# Patient Record
Sex: Female | Born: 1960
Health system: Southern US, Community
[De-identification: ages and names within clinical notes are randomized; demographics above are authoritative.]

## PROBLEM LIST (undated history)

## (undated) DIAGNOSIS — Z860100 Personal history of colon polyps, unspecified: Secondary | ICD-10-CM

## (undated) DIAGNOSIS — F329 Major depressive disorder, single episode, unspecified: Secondary | ICD-10-CM

## (undated) DIAGNOSIS — J929 Pleural plaque without asbestos: Secondary | ICD-10-CM

## (undated) DIAGNOSIS — R6882 Decreased libido: Secondary | ICD-10-CM

## (undated) DIAGNOSIS — D649 Anemia, unspecified: Secondary | ICD-10-CM

## (undated) DIAGNOSIS — K5900 Constipation, unspecified: Secondary | ICD-10-CM

## (undated) DIAGNOSIS — M1990 Unspecified osteoarthritis, unspecified site: Secondary | ICD-10-CM

## (undated) DIAGNOSIS — F32A Depression, unspecified: Secondary | ICD-10-CM

## (undated) DIAGNOSIS — F419 Anxiety disorder, unspecified: Secondary | ICD-10-CM

## (undated) DIAGNOSIS — Q615 Medullary cystic kidney: Secondary | ICD-10-CM

## (undated) DIAGNOSIS — M549 Dorsalgia, unspecified: Secondary | ICD-10-CM

## (undated) DIAGNOSIS — R609 Edema, unspecified: Secondary | ICD-10-CM

## (undated) DIAGNOSIS — M797 Fibromyalgia: Secondary | ICD-10-CM

## (undated) DIAGNOSIS — J941 Fibrothorax: Secondary | ICD-10-CM

## (undated) DIAGNOSIS — K589 Irritable bowel syndrome without diarrhea: Secondary | ICD-10-CM

## (undated) DIAGNOSIS — Z87442 Personal history of urinary calculi: Secondary | ICD-10-CM

## (undated) DIAGNOSIS — I10 Essential (primary) hypertension: Secondary | ICD-10-CM

## (undated) DIAGNOSIS — C439 Malignant melanoma of skin, unspecified: Secondary | ICD-10-CM

## (undated) DIAGNOSIS — M0520 Rheumatoid vasculitis with rheumatoid arthritis of unspecified site: Secondary | ICD-10-CM

## (undated) DIAGNOSIS — Z8719 Personal history of other diseases of the digestive system: Secondary | ICD-10-CM

## (undated) DIAGNOSIS — G43909 Migraine, unspecified, not intractable, without status migrainosus: Secondary | ICD-10-CM

## (undated) DIAGNOSIS — N1330 Unspecified hydronephrosis: Secondary | ICD-10-CM

## (undated) DIAGNOSIS — J4520 Mild intermittent asthma, uncomplicated: Secondary | ICD-10-CM

## (undated) DIAGNOSIS — R0602 Shortness of breath: Secondary | ICD-10-CM

## (undated) HISTORY — PX: PARTIAL NEPHRECTOMY: SHX414

## (undated) HISTORY — DX: Dorsalgia, unspecified: M54.9

## (undated) HISTORY — DX: Shortness of breath: R06.02

## (undated) HISTORY — DX: Edema, unspecified: R60.9

## (undated) HISTORY — DX: Unspecified osteoarthritis, unspecified site: M19.90

## (undated) HISTORY — DX: Malignant melanoma of skin, unspecified: C43.9

## (undated) HISTORY — DX: Anemia, unspecified: D64.9

## (undated) HISTORY — DX: Decreased libido: R68.82

## (undated) HISTORY — DX: Depression, unspecified: F32.A

## (undated) HISTORY — DX: Essential (primary) hypertension: I10

## (undated) HISTORY — PX: OTHER SURGICAL HISTORY: SHX169

## (undated) HISTORY — PX: COLONOSCOPY W/ POLYPECTOMY: SHX1380

## (undated) HISTORY — DX: Anxiety disorder, unspecified: F41.9

## (undated) HISTORY — PX: LAPAROSCOPIC CHOLECYSTECTOMY: SUR755

## (undated) HISTORY — DX: Rheumatoid vasculitis with rheumatoid arthritis of unspecified site: M05.20

## (undated) HISTORY — DX: Major depressive disorder, single episode, unspecified: F32.9

## (undated) HISTORY — DX: Fibromyalgia: M79.7

## (undated) HISTORY — DX: Constipation, unspecified: K59.00

---

## 1991-11-28 HISTORY — PX: TUBAL LIGATION: SHX77

## 1994-11-27 HISTORY — PX: ABDOMINAL HYSTERECTOMY: SHX81

## 1994-11-27 HISTORY — PX: TOTAL ABDOMINAL HYSTERECTOMY: SHX209

## 2000-03-15 ENCOUNTER — Encounter: Admission: RE | Admit: 2000-03-15 | Discharge: 2000-03-15 | Payer: Self-pay | Admitting: Family Medicine

## 2000-03-15 ENCOUNTER — Encounter: Payer: Self-pay | Admitting: Family Medicine

## 2000-06-27 HISTORY — PX: ERCP: SHX60

## 2000-07-12 ENCOUNTER — Emergency Department (HOSPITAL_COMMUNITY): Admission: EM | Admit: 2000-07-12 | Discharge: 2000-07-13 | Payer: Self-pay | Admitting: Emergency Medicine

## 2000-07-13 ENCOUNTER — Inpatient Hospital Stay (HOSPITAL_COMMUNITY): Admission: EM | Admit: 2000-07-13 | Discharge: 2000-07-18 | Payer: Self-pay | Admitting: Emergency Medicine

## 2000-07-13 ENCOUNTER — Encounter: Payer: Self-pay | Admitting: Emergency Medicine

## 2000-07-16 ENCOUNTER — Encounter: Payer: Self-pay | Admitting: Gastroenterology

## 2000-07-17 ENCOUNTER — Encounter: Payer: Self-pay | Admitting: General Surgery

## 2000-10-15 ENCOUNTER — Encounter (INDEPENDENT_AMBULATORY_CARE_PROVIDER_SITE_OTHER): Payer: Self-pay

## 2000-10-15 ENCOUNTER — Observation Stay (HOSPITAL_COMMUNITY): Admission: RE | Admit: 2000-10-15 | Discharge: 2000-10-16 | Payer: Self-pay | Admitting: General Surgery

## 2001-07-09 ENCOUNTER — Other Ambulatory Visit: Admission: RE | Admit: 2001-07-09 | Discharge: 2001-07-09 | Payer: Self-pay | Admitting: Obstetrics & Gynecology

## 2002-12-18 ENCOUNTER — Encounter: Admission: RE | Admit: 2002-12-18 | Discharge: 2002-12-18 | Payer: Self-pay | Admitting: Urology

## 2002-12-18 ENCOUNTER — Encounter: Payer: Self-pay | Admitting: Urology

## 2002-12-31 ENCOUNTER — Encounter: Admission: RE | Admit: 2002-12-31 | Discharge: 2002-12-31 | Payer: Self-pay | Admitting: Urology

## 2002-12-31 ENCOUNTER — Encounter: Payer: Self-pay | Admitting: Urology

## 2003-03-09 ENCOUNTER — Ambulatory Visit (HOSPITAL_COMMUNITY): Admission: RE | Admit: 2003-03-09 | Discharge: 2003-03-09 | Payer: Self-pay | Admitting: Gastroenterology

## 2003-03-09 ENCOUNTER — Encounter (INDEPENDENT_AMBULATORY_CARE_PROVIDER_SITE_OTHER): Payer: Self-pay | Admitting: *Deleted

## 2006-03-26 ENCOUNTER — Encounter: Admission: RE | Admit: 2006-03-26 | Discharge: 2006-03-26 | Payer: Self-pay | Admitting: Urology

## 2006-04-17 ENCOUNTER — Encounter: Admission: RE | Admit: 2006-04-17 | Discharge: 2006-04-17 | Payer: Self-pay | Admitting: Family Medicine

## 2006-04-17 ENCOUNTER — Encounter: Payer: Self-pay | Admitting: Pulmonary Disease

## 2006-05-18 ENCOUNTER — Encounter (INDEPENDENT_AMBULATORY_CARE_PROVIDER_SITE_OTHER): Payer: Self-pay | Admitting: Specialist

## 2006-05-18 ENCOUNTER — Inpatient Hospital Stay (HOSPITAL_COMMUNITY): Admission: RE | Admit: 2006-05-18 | Discharge: 2006-05-22 | Payer: Self-pay | Admitting: Urology

## 2007-03-01 ENCOUNTER — Ambulatory Visit: Payer: Self-pay | Admitting: Family Medicine

## 2007-08-14 ENCOUNTER — Encounter: Admission: RE | Admit: 2007-08-14 | Discharge: 2007-08-14 | Payer: Self-pay | Admitting: Urology

## 2007-10-14 ENCOUNTER — Ambulatory Visit: Payer: Self-pay | Admitting: Family Medicine

## 2008-03-02 ENCOUNTER — Ambulatory Visit: Payer: Self-pay | Admitting: Family Medicine

## 2008-11-11 ENCOUNTER — Encounter: Admission: RE | Admit: 2008-11-11 | Discharge: 2008-11-11 | Payer: Self-pay | Admitting: Family Medicine

## 2008-11-11 ENCOUNTER — Ambulatory Visit: Payer: Self-pay | Admitting: Family Medicine

## 2008-11-11 ENCOUNTER — Encounter: Payer: Self-pay | Admitting: Pulmonary Disease

## 2008-12-11 ENCOUNTER — Ambulatory Visit: Payer: Self-pay | Admitting: Family Medicine

## 2009-01-13 ENCOUNTER — Ambulatory Visit: Payer: Self-pay | Admitting: Family Medicine

## 2009-01-29 ENCOUNTER — Encounter: Admission: RE | Admit: 2009-01-29 | Discharge: 2009-01-29 | Payer: Self-pay | Admitting: Family Medicine

## 2009-01-29 ENCOUNTER — Encounter: Payer: Self-pay | Admitting: Pulmonary Disease

## 2009-07-16 ENCOUNTER — Ambulatory Visit: Payer: Self-pay | Admitting: Family Medicine

## 2010-04-14 ENCOUNTER — Ambulatory Visit: Payer: Self-pay | Admitting: Family Medicine

## 2010-04-15 ENCOUNTER — Encounter: Payer: Self-pay | Admitting: Pulmonary Disease

## 2010-04-15 ENCOUNTER — Encounter: Admission: RE | Admit: 2010-04-15 | Discharge: 2010-04-15 | Payer: Self-pay | Admitting: Family Medicine

## 2010-04-28 ENCOUNTER — Encounter: Payer: Self-pay | Admitting: Pulmonary Disease

## 2010-04-28 ENCOUNTER — Ambulatory Visit: Payer: Self-pay | Admitting: Physician Assistant

## 2010-04-29 ENCOUNTER — Ambulatory Visit: Payer: Self-pay | Admitting: Pulmonary Disease

## 2010-04-29 DIAGNOSIS — J45909 Unspecified asthma, uncomplicated: Secondary | ICD-10-CM | POA: Insufficient documentation

## 2010-04-29 DIAGNOSIS — J189 Pneumonia, unspecified organism: Secondary | ICD-10-CM | POA: Insufficient documentation

## 2010-04-29 DIAGNOSIS — J8410 Pulmonary fibrosis, unspecified: Secondary | ICD-10-CM | POA: Insufficient documentation

## 2010-04-29 LAB — CONVERTED CEMR LAB
Albumin: 4.3 g/dL (ref 3.5–5.2)
BUN: 17 mg/dL (ref 6–23)
Chloride: 105 meq/L (ref 96–112)
Eosinophils Relative: 1.3 % (ref 0.0–5.0)
HCT: 39.4 % (ref 36.0–46.0)
Hemoglobin: 13.6 g/dL (ref 12.0–15.0)
Lymphocytes Relative: 33 % (ref 12.0–46.0)
MCHC: 34.5 g/dL (ref 30.0–36.0)
MCV: 89.1 fL (ref 78.0–100.0)
Neutro Abs: 3.5 10*3/uL (ref 1.4–7.7)
RDW: 13.4 % (ref 11.5–14.6)
TSH: 0.66 microintl units/mL (ref 0.35–5.50)
Total Bilirubin: 0.7 mg/dL (ref 0.3–1.2)
WBC: 5.8 10*3/uL (ref 4.5–10.5)

## 2010-05-02 ENCOUNTER — Ambulatory Visit: Payer: Self-pay | Admitting: Cardiology

## 2010-05-06 ENCOUNTER — Ambulatory Visit: Payer: Self-pay | Admitting: Pulmonary Disease

## 2010-05-10 ENCOUNTER — Encounter: Payer: Self-pay | Admitting: Pulmonary Disease

## 2010-05-10 LAB — CONVERTED CEMR LAB
Anti Nuclear Antibody(ANA): POSITIVE — AB
HCV Ab: NEGATIVE
Hep B C IgM: NEGATIVE
Hep B S Ab: NEGATIVE
Hepatitis B Surface Ag: NEGATIVE

## 2010-05-24 ENCOUNTER — Ambulatory Visit: Payer: Self-pay | Admitting: Pulmonary Disease

## 2010-07-06 ENCOUNTER — Telehealth (INDEPENDENT_AMBULATORY_CARE_PROVIDER_SITE_OTHER): Payer: Self-pay | Admitting: *Deleted

## 2010-08-24 ENCOUNTER — Ambulatory Visit: Payer: Self-pay | Admitting: Family Medicine

## 2010-09-06 ENCOUNTER — Ambulatory Visit: Payer: Self-pay | Admitting: Family Medicine

## 2010-12-27 NOTE — Assessment & Plan Note (Signed)
Summary: f/u per Dr. Carney Harder   Copy to:  Dr. Sharlot Gowda Primary Provider/Referring Provider:  Dr. Sharlot Gowda  CC:  F/u per Dr Craige Cotta.  History of Present Illness: 50 yo female with mild pulmonary fibrosis.   Her breathing has been doing okay. She denies cough, wheeze, sputum, fever, hemoptysis, chest pain, sweats, leg swelling, rash, joint pain, or abdominal pain.  She is not using proventil that much, and has only been using symbicort sporadically.  Labs from April 29, 2010: ESR 11, ANA 1:320 with homogenous pattern, RF < 20, ACE 30. Pulmonary function test from May 06, 2010 was normal.  CT of Chest  Procedure date:  05/02/2010  Findings:      Technique:  Multidetector CT imaging of the chest was performed following the standard protocol during bolus administration of intravenous contrast.   Contrast: 80 ml Omnipaque-300   Comparison: Chest x-ray 04/15/2010 and CT chest 04/17/2006   Findings: Prevascular soft tissue density is stable.  No pathologically enlarged mediastinal, hilar or axillary lymph nodes. Heart size normal.  No pericardial effusion.   Minimal biapical pleural parenchymal scarring.  Mild subpleural fibrosis.  No pleural fluid.  Airway is unremarkable. No air trapping on inspiratory and expiratory imaging.   Incidental imaging of the upper abdomen shows no acute findings. No worrisome lytic or sclerotic lesions.   IMPRESSION: Mild subpleural fibrosis.  No acute findings.   Current Medications (verified): 1)  Symbicort 160-4.5 Mcg/act Aero (Budesonide-Formoterol Fumarate) .... Two Puffs Two Times A Day 2)  Proventil Hfa 108 (90 Base) Mcg/act Aers (Albuterol Sulfate) .... Two Puffs Up To Four Times Per Day As Needed  Allergies (verified): 1)  Pcn  Past History:  Past Medical History: Medullary sponge kidney Nephrolithiasis Fatty liver IBS Pancreatitis 2001 Hemorrhoids Pulmonary fibrosis with hx of chronic macrodantin use      - PFT  05/06/10 FEV1 2.98(112%), FEV1% 77, TLC 5.69(110%), DLCO 87%, no BD      - ANA 1:320 from 04/29/10 Mild, intermittent asthma  Past Surgical History: Reviewed history from 04/29/2010 and no changes required. C-section x two Tubal ligation 1993 Hysterectomy 1996 for abnormal bleeding Right partial nephrectomy 1998 for benign lesion ERCP 2001 Laparoscopic cholecystectomy 2001 Colon polypectomy 2004 Left partial nephrectomy 2007 for angiomyolipoma  Review of Systems       The patient complains of headaches.  The patient denies shortness of breath with activity, shortness of breath at rest, productive cough, non-productive cough, coughing up blood, chest pain, irregular heartbeats, acid heartburn, indigestion, loss of appetite, weight change, abdominal pain, difficulty swallowing, sore throat, nasal congestion/difficulty breathing through nose, itching, hand/feet swelling, joint stiffness or pain, rash, change in color of mucus, and fever.    Vital Signs:  Patient profile:   50 year old female Height:      66 inches Weight:      173 pounds O2 Sat:      99 % on Room air Temp:     98.1 degrees F Pulse rate:   72 / minute BP sitting:   110 / 82  (right arm) Cuff size:   regular  Vitals Entered By: Denna Haggard, CMA (May 24, 2010 3:27 PM)  O2 Sat at Rest %:  99% O2 Flow:  Room air  Reason for Visit F/U per Dr Craige Cotta, pt states she is doing a lot better, pt states her breating is a lot  better but still has a small temp problem adjusting from hot to cold. CC: F/u  per Dr Craige Cotta   Physical Exam  General:  normal appearance and healthy appearing.   Nose:  no deformity, discharge, inflammation, or lesions Mouth:  no deformity or lesions Neck:  no masses, thyromegaly, or abnormal cervical nodes Lungs:  faint basilar crackles, no wheezing Heart:  regular rate and rhythm, S1, S2 without murmurs, rubs, gallops, or clicks Extremities:  no clubbing, cyanosis, edema, or deformity  noted Cervical Nodes:  no significant adenopathy   Impression & Recommendations:  Problem # 1:  INTERSTITIAL LUNG DISEASE (ICD-515) This is likely related to prior macrodantin use.  She is asymptomatic, her pulmonary function tests are normal, and her CT chest has only mild changes.  She does have an elevated ANA, but nothing else to suggest a connective tissue disease.  I don't think she needs any further diagnostic studies (ie bronchoscopy or lung biopsy) at this time.  I don't think she requires any specific therapy at this time.  I have recommended close clinical monitoring, and she will need to have repeat ANA in several months.  If her ANA remains elevated or if she develops symptoms of connective tissue disease, then she may need rheumatology evaluation.  Problem # 2:  REACTIVE AIRWAY DISEASE (ICD-493.90) She likely has mild, intermittent asthma exacerbated by recent respiratory infection.  She has not been using her symbicort much.  I have advised her that it is okay to stop symbicort, and use proventil as needed for now.  Complete Medication List: 1)  Proventil Hfa 108 (90 Base) Mcg/act Aers (Albuterol sulfate) .... Two puffs up to four times per day as needed  Other Orders: Est. Patient Level IV (01027)  Patient Instructions: 1)  Proventil two puffs up to four times per day as needed for cough, wheeze, congestion, or shortness of breath 2)  Stop symbicort 3)  Follow up in 4 to 6 months

## 2010-12-27 NOTE — Progress Notes (Signed)
Summary: need corrected diag codes  Phone Note Other Incoming Call back at [phone]   Caller: [facility]- [place] Foil Summary of Call: Ref# [id number] - Need diagnosis codes for some labs - Dennie Bible says it was from visit on 06/03 Initial call taken by: Eugene Gavia,  July 06, 2010 10:40 AM  Follow-up for Phone Call        Diagnosis is 493.90 for OV and labs from 04/29/2010 with Dr. Craige Cotta. [facility] notified of this. Follow-up by: Michel Bickers CMA,  July 06, 2010 2:36 PM    0

## 2010-12-27 NOTE — Letter (Signed)
Summary: Ronnald Nian MD  Ronnald Nian MD   Imported By: Lester Paoli 05/09/2010 08:16:06  _____________________________________________________________________  External Attachment:    Type:   Image     Comment:   External Document

## 2010-12-27 NOTE — Miscellaneous (Signed)
Summary: Pulmonary function test   Pulmonary Function Test Date: 05/06/2010 Height (in.): 65 Gender: Female  Pre-Spirometry FVC    Value: 3.88 L/min   Pred: 3.47 L/min     % Pred: 112 % FEV1    Value: 2.98 L     Pred: 2.65 L     % Pred: 112 % FEV1/FVC  Value: 77 %     Pred: 76 %     % Pred: . % FEF 25-75  Value: 2.70 L/min   Pred: 3.01 L/min     % Pred: 90 %  Post-Spirometry FVC    Value: 3.69 L/min   Pred: 3.47 L/min     % Pred: 106 % FEV1    Value: 2.95 L     Pred: 2.65 L     % Pred: 111 % FEV1/FVC  Value: 80 %     Pred: 76 %     % Pred: . % FEF 25-75  Value: 2.74 L/min   Pred: 3.01 L/min     % Pred: 91 %  Lung Volumes TLC    Value: 5.69 L   % Pred: 110 % RV    Value: 1.81 L   % Pred: 100 % DLCO    Value: 20.9 %   % Pred: 87 % DLCO/VA  Value: 4.08 %   % Pred: 101 %  Comments: Normal spirometry.  No bronchodilator response.  Normal lung volumes.  Normal diffusion capacity. Clinical Lists Changes  Observations: Added new observation of PFT COMMENTS: Normal spirometry.  No bronchodilator response.  Normal lung volumes.  Normal diffusion capacity. (05/06/2010 18:22) Added new observation of DLCO/VA%EXP: 101 % (05/06/2010 18:22) Added new observation of DLCO/VA: 4.08 % (05/06/2010 18:22) Added new observation of DLCO % EXPEC: 87 % (05/06/2010 18:22) Added new observation of DLCO: 20.9 % (05/06/2010 18:22) Added new observation of RV % EXPECT: 100 % (05/06/2010 18:22) Added new observation of RV: 1.81 L (05/06/2010 18:22) Added new observation of TLC % EXPECT: 110 % (05/06/2010 18:22) Added new observation of TLC: 5.69 L (05/06/2010 18:22) Added new observation of FEF2575%EXPS: 91 % (05/06/2010 18:22) Added new observation of PSTFEF25/75P: 3.01  (05/06/2010 18:22) Added new observation of PSTFEF25/75%: 2.74 L/min (05/06/2010 18:22) Added new observation of PSTFEV1/FCV%: . % (05/06/2010 18:22) Added new observation of FEV1FVCPRDPS: 76 % (05/06/2010 18:22) Added new observation  of PSTFEV1/FVC: 80 % (05/06/2010 18:22) Added new observation of POSTFEV1%PRD: 111 % (05/06/2010 18:22) Added new observation of FEV1PRDPST: 2.65 L (05/06/2010 18:22) Added new observation of POST FEV1: 2.95 L/min (05/06/2010 18:22) Added new observation of POST FVC%EXP: 106 % (05/06/2010 18:22) Added new observation of FVCPRDPST: 3.47 L/min (05/06/2010 18:22) Added new observation of POST FVC: 3.69 L (05/06/2010 18:22) Added new observation of FEF % EXPEC: 90 % (05/06/2010 18:22) Added new observation of FEF25-75%PRE: 3.01 L/min (05/06/2010 18:22) Added new observation of FEF 25-75%: 2.70 L/min (05/06/2010 18:22) Added new observation of FEV1/FVC%EXP: . % (05/06/2010 18:22) Added new observation of FEV1/FVC PRE: 76 % (05/06/2010 18:22) Added new observation of FEV1/FVC: 77 % (05/06/2010 18:22) Added new observation of FEV1 % EXP: 112 % (05/06/2010 18:22) Added new observation of FEV1 PREDICT: 2.65 L (05/06/2010 18:22) Added new observation of FEV1: 2.98 L (05/06/2010 18:22) Added new observation of FVC % EXPECT: 112 % (05/06/2010 18:22) Added new observation of FVC PREDICT: 3.47 L (05/06/2010 18:22) Added new observation of FVC: 3.88 L (05/06/2010 18:22) Added new observation of PFT HEIGHT: 65  (05/06/2010 18:22) Added new observation of  PFT DATE: 05/06/2010  (05/06/2010 18:22)

## 2010-12-27 NOTE — Assessment & Plan Note (Signed)
Summary: chinic interstitial lung changes on cxr/apc   Copy to:  Dr. Sharlot Gowda Primary Provider/Referring Provider:  Dr. Sharlot Gowda  CC:  Pulmonary Consult.  Marland Kitchen  History of Present Illness: 50 yo female with dyspnea and interstitial lung disease.  She has an extensive prior history of kidney problems and bladder infections.  She was started on macrodantin in 2007, and remained on this until December 2010.  She had CT chest from 2007 which showed patchy parenchymal opacities.  Her subsequent chest xrays have show chronic interstitial changes.  She has not had a breathing test or lung biopsy before.  She says, however, that her breathing usually does not cause her any problems except when she gets a cold.  She developed a respiratory infection about 2 weeks, and ago.  She was given a course of biaxin XL.  She was noted to have wheezing with response to nebulizer therapy.  She was therefore also started on symbicort and albuterol.  She feels the inhalers have helped her breathing.  When her symptoms started 2 weeks ago she was having fever upto 102F, cough with brown to green sputum, wheezing, and chest tightness.  She did not have hemoptysis, or sweats.  Her symptoms have since improved.  Her weight has been steady.  She has not had skin rash or hives.  She does not have any problem taking NSAIDs.  There is no history of arthritis or lupus.  There is no prior history of asthma, or allergies.  She has taken prednisone before when she gets bronchitis.  She has not smoked, but her husband does.  There is no travel history, sick exposures, or animal exposures.  She works as a Pensions consultant at [employer].  She gets bronchitis about once per year.  There is no history of TB.    Preventive Screening-Counseling & Management  Alcohol-Tobacco     Smoking Status: never  Current Medications (verified): 1)  None  Allergies (verified): 1)  Pcn  Past History:  Past Medical History: Medullary  sponge kidney Nephrolithiasis Fatty liver IBS Pancreatitis 2001 Hemorrhoids  Past Surgical History: C-section x two Tubal ligation 1993 Hysterectomy 1996 for abnormal bleeding Right partial nephrectomy 1998 for benign lesion ERCP 2001 Laparoscopic cholecystectomy 2001 Colon polypectomy 2004 Left partial nephrectomy 2007 for angiomyolipoma  CXR  Procedure date:  04/15/2010  Findings:       CHEST - 2 VIEW    Comparison: Chest x-ray of 11/11/2008    Findings: - The lungs are clear.  Slightly prominent interstitial   markings primarily at the bases appears stable and most consistent   with chronic interstitial change.  Mediastinal contours are stable.   The heart is within normal limits in size.  No bony abnormality is   seen.    IMPRESSION:   No active lung disease.  Stable chronic interstitial change.  CT of Chest  Procedure date:  04/17/2006  Findings:       CT ANGIOGRAPHY OF CHEST:   Technique:  Multidetector CT imaging of the chest was performed   during bolus injection of intravenous contrast.  Multiplanar CT   angiographic image reconstructions were generated to evaluate the   vascular anatomy.   Contrast: 120 cc Omnipaque 300   Comparison:  Abdominal CT, 12/31/02, from [facility].   Findings:  The pulmonary arteries are well-opacified with contrast.   There is no evidence of acute pulmonary embolism.  The thoracic aorta   appears normal.  The left vertebral artery is noted  to arise directly   from the aortic arch.  There is no pleural or pericardial effusion.   No enlarged mediastinal or hilar lymph nodes are seen.   On the lung windows, patchy peripheral opacities are present in both   lower lobes as well as anteriorly in the upper lobes and the right   middle lobe.  There is no confluent air space opacity or   endobronchial lesion.  The lung bases were clear on the prior   examination.   Images through the upper abdomen demonstrate stable scarring  in the   upper pole of the right kidney.  There is no adrenal mass.  The   gallbladder is surgically absent.   IMPRESSION:   1.  No evidence of acute pulmonary embolism.   2.  Patchy parenchymal opacities appear new from a prior abdominal CT   and are suspicious for pneumonitis.  There is no consolidation, focal   mass or adenopathy.  Chest radiographic follow-up is recommended.     Family History: Reviewed history and no changes required. father-lung ca  Social History: Reviewed history and no changes required. Patient never smoked.  married 2 children Works as Pensions consultant at [employer].Smoking Status:  never  Review of Systems       The patient complains of shortness of breath with activity, shortness of breath at rest, productive cough, non-productive cough, nasal congestion/difficulty breathing through nose, sneezing, ear ache, change in color of mucus, and fever.  The patient denies coughing up blood, chest pain, irregular heartbeats, acid heartburn, indigestion, loss of appetite, weight change, abdominal pain, difficulty swallowing, sore throat, tooth/dental problems, headaches, itching, anxiety, depression, hand/feet swelling, joint stiffness or pain, and rash.    Vital Signs:  Patient profile:   50 year old female Height:      65 inches Weight:      177 pounds BMI:     29.56 O2 Sat:      97 % on Room air Temp:     98.2 degrees F oral Pulse rate:   56 / minute BP sitting:   128 / 78  (left arm) Cuff size:   regular  Vitals Entered By: Gweneth Dimitri RN (April 29, 2010 9:11 AM)  O2 Flow:  Room air CC: Pulmonary Consult.   Comments Medications reviewed with patient Daytime contact number verified with patient. Gweneth Dimitri RN  April 29, 2010 9:12 AM    Physical Exam  General:  normal appearance and healthy appearing.   Eyes:  PERRLA and EOMI.   Nose:  no deformity, discharge, inflammation, or lesions Mouth:  no deformity or lesions Neck:  no masses,  thyromegaly, or abnormal cervical nodes Chest Wall:  no deformities noted Lungs:  faint basilar crackles, no wheezing Heart:  regular rate and rhythm, S1, S2 without murmurs, rubs, gallops, or clicks Abdomen:  bowel sounds positive; abdomen soft and non-tender without masses, or organomegaly Msk:  no deformity or scoliosis noted with normal posture Pulses:  pulses normal Extremities:  no clubbing, cyanosis, edema, or deformity noted Neurologic:  CN II-XII grossly intact with normal reflexes, coordination, muscle strength and tone Skin:  intact without lesions or rashes Cervical Nodes:  no significant adenopathy Axillary Nodes:  no significant adenopathy Psych:  alert and cooperative; normal mood and affect; normal attention span and concentration   Impression & Recommendations:  Problem # 1:  INTERSTITIAL LUNG DISEASE (ICD-515) She has chronic interstitial changes on chest xray and CT chest dating back to 2007.  This correlates when she was started on macrodantin for bladder infections.  She as since stopped macrodantin.  My suspicion is that the macrodantin is the cause of her radiographic findings.  To further assess this I will arrange for pulmonary function testing, high resolution CT chest, and lab testing.  Depending on the results further interventions will be determined.  Problem # 2:  REACTIVE AIRWAY DISEASE (ICD-493.90) She has an asthmatic bronchitis in the setting of an acute respiratory infection.  Will continue symbicort and as needed proventil for now.  Will arrange for pulmonary function testing.  Depending on her response will decide if she requires long-term asthma therapy.  Medications Added to Medication List This Visit: 1)  Symbicort 160-4.5 Mcg/act Aero (Budesonide-formoterol fumarate) .... Two puffs two times a day 2)  Proventil Hfa 108 (90 Base) Mcg/act Aers (Albuterol sulfate) .... Two puffs up to four times per day as needed  Complete Medication List: 1)   Symbicort 160-4.5 Mcg/act Aero (Budesonide-formoterol fumarate) .... Two puffs two times a day 2)  Proventil Hfa 108 (90 Base) Mcg/act Aers (Albuterol sulfate) .... Two puffs up to four times per day as needed  Other Orders: Consultation Level V (99245) TLB-BMP (Basic Metabolic Panel-BMET) (80048-METABOL) TLB-CBC Platelet - w/Differential (85025-CBCD) TLB-Hepatic/Liver Function Pnl (80076-HEPATIC) TLB-TSH (Thyroid Stimulating Hormone) (84443-TSH) TLB-Rheumatoid Factor (RA) (16109-UE) TLB-Sedimentation Rate (ESR) (85652-ESR) T-Angiotensin i-Converting Enzyme (45409-81191) T-Antinuclear Antib (ANA) [phone]) T-Hepatitis B Core Antibody, IGM (08657-84696) T-Hepatitis B Core Antibody (29528-41324) T-Hepatitis B Surface Antigen (40102-72536) T-Hepatitis B Surface Antibody (64403-47425) T-Hepatitis C Antibody (95638-75643) Radiology Referral (Radiology) Full Pulmonary Function Test (PFT)  Patient Instructions: 1)  Lab test today 2)  Will schedule breathing test (PFT) 3)  Will schedule CT chest

## 2010-12-27 NOTE — Miscellaneous (Signed)
Summary: Orders Update pft charges  Clinical Lists Changes  Orders: Added new Service order of Carbon Monoxide diffusing w/capacity (94720) - Signed Added new Service order of Lung Volumes (94240) - Signed Added new Service order of Spirometry (Pre & Post) (94060) - Signed 

## 2011-01-16 ENCOUNTER — Other Ambulatory Visit: Payer: Self-pay | Admitting: Gastroenterology

## 2011-01-16 ENCOUNTER — Ambulatory Visit (HOSPITAL_COMMUNITY)
Admission: RE | Admit: 2011-01-16 | Discharge: 2011-01-16 | Disposition: A | Discharge status: Home / Self Care | Payer: 59 | Source: Ambulatory Visit | Attending: Gastroenterology | Admitting: Gastroenterology

## 2011-01-16 DIAGNOSIS — K5900 Constipation, unspecified: Secondary | ICD-10-CM | POA: Insufficient documentation

## 2011-01-16 DIAGNOSIS — K648 Other hemorrhoids: Secondary | ICD-10-CM | POA: Insufficient documentation

## 2011-01-16 DIAGNOSIS — K644 Residual hemorrhoidal skin tags: Secondary | ICD-10-CM | POA: Insufficient documentation

## 2011-01-16 DIAGNOSIS — K5730 Diverticulosis of large intestine without perforation or abscess without bleeding: Secondary | ICD-10-CM | POA: Insufficient documentation

## 2011-01-16 DIAGNOSIS — D126 Benign neoplasm of colon, unspecified: Secondary | ICD-10-CM | POA: Insufficient documentation

## 2011-01-16 LAB — HM COLONOSCOPY

## 2011-01-30 ENCOUNTER — Ambulatory Visit (INDEPENDENT_AMBULATORY_CARE_PROVIDER_SITE_OTHER): Payer: 59 | Admitting: Family Medicine

## 2011-01-30 DIAGNOSIS — J0100 Acute maxillary sinusitis, unspecified: Secondary | ICD-10-CM

## 2011-01-30 DIAGNOSIS — J209 Acute bronchitis, unspecified: Secondary | ICD-10-CM

## 2011-02-03 NOTE — Op Note (Signed)
  NAMELANNY, [name] NO.:  [id number]  MEDICAL RECORD NO.:  [id number]           PATIENT TYPE:  O  LOCATION:  [facility]                         FACILITY:  [facility]  PHYSICIAN:  Petra Kuba, M.D.    DATE OF BIRTH:  1961/10/13  DATE OF PROCEDURE:  01/16/2011 DATE OF DISCHARGE:  01/16/2011                              OPERATIVE REPORT   PROCEDURE:  Colonoscopy with polypectomy.  INDICATIONS:  The patient overdue with colonic screening, but with increasing constipation.  Consent was signed after risks, benefits, methods, options thoroughly discussed multiple times in the past.  MEDICINES USED:  Fentanyl 125 mcg, Versed 12 mg.  PROCEDURE:  Rectal inspection was pertinent for external hemorrhoids. Small digital exam was negative.  The video colonoscope was inserted and fairly easily advanced around the colon to the cecum.  This did require abdominal pressure, but no position changes.  On insertion, rare few left-sided diverticula were seen, but no other abnormalities.  Cecum was identified by the appendiceal orifice and ileocecal valve.  In fact the scope was inserted in the ileocecal valve.  The scope was slowly withdrawn.  The prep was adequate.  There was some liquid stool that required washing and suctioning on slow withdrawal through the colon. The cecum, ascending, transverse, and descending were all normal.  As the scope was withdrawn around the sigmoid, rare diverticula small were seen.  In the distal sigmoid, a tiny small polyp was seen, snared, electrocautery applied.  The polyp was suctioned through the scope and collected in the trap.  Scope was withdrawn back to the rectum. Anorectal pull-through and retroflexion revealed some tiny hemorrhoids, but no additional findings were seen.  The scope was re-advanced to the short way up the left side of the colon.  Air was suctioned, scope removed.  The patient tolerated the procedure well.  There was no obvious  immediate complication.  ENDOSCOPIC DIAGNOSES: 1. Internal-external hemorrhoids. 2. Few left-sided early diverticula. 3. Tiny to small distal semi-sessile sigmoid polyp status post snare. 4. Otherwise within normal limits to the cecum.  PLAN:  Await pathology.  Probably repeat colon screening in 5 years. Trial of MiraLax, but if it does not help for her constipation, happy to see back p.r.n. or a trial of Amitiza per primary care.          ______________________________ Petra Kuba, M.D.     MEM/MEDQ  D:  01/16/2011  T:  01/17/2011  Job:  [id number]  cc:   Sharlot Gowda, M.D.  Electronically Signed by Vida Rigger M.D. on 02/02/2011 08:00:08 AM

## 2011-04-14 NOTE — Discharge Summary (Signed)
NAMESURAIYA, Webb NO.:  [id number]   MEDICAL RECORD NO.:  [id number]          PATIENT TYPE:  INP   LOCATION:  1403                         FACILITY:  [facility]   PHYSICIAN:  Jamison Neighbor, M.D.  DATE OF BIRTH:  09-10-1961   DATE OF ADMISSION:  05/18/2006  DATE OF DISCHARGE:  05/22/2006                                 DISCHARGE SUMMARY   SERVICE:  Urology.   ADMISSION DIAGNOSIS:  Left renal mass.   DISCHARGE DIAGNOSIS:  Left angiomyolipoma.   PROCEDURE:  Left partial nephrectomy on May 18, 2006.   HISTORY OF PRESENT ILLNESS:  [name] is a 50 year old female admitted with  a left renal mass. This has been identified after a workup for hematuria.  She had a CT scan showing a solid appearing enhancing lesion in the anterior  aspect of the lower pole of the kidney. MRI study was performed with imaging  characteristics also concerning for a renal neoplasm. Of note, the patient  has a history of a right partial nephrectomy which was reported for benign  disease. Because of her findings on imaging studies, she is admitted for  left partial nephrectomy.   HOSPITAL COURSE:  The patient was brought to the hospital on the day of the  procedure and taken to the operating room for the procedure described above.  She tolerated the surgery well and was transferred to the floor in stable  condition. Her postoperative course was relatively uncomplicated. She was  ambulated without difficulty. Her diet was slowly advanced. She did have  some nausea with p.o. pain medicines requiring tailoring of this but was  gradually able to control with oral hydrocodone and Tylenol. She had a JP  drain in place after surgery which had minimal to no output and this was  removed prior to discharge. She otherwise was doing well and ready for  discharge on postoperative day #4.   VITAL SIGNS:  At the time of discharge, she is afebrile, vital signs within  normal limits.  GENERAL:  She  is in no acute distress.  HEART:  Regular.  LUNGS:  Clear.  ABDOMEN:  Soft, nontender. Her surgical incision in her left flank was  clean, dry and intact with staples in place.  EXTREMITIES:  There was cyanosis, clubbing or edema.   DISPOSITION AND DISCHARGE INSTRUCTIONS:  [name] was discharged to home in  stable condition. She presumed diet. She was instructed not to do any  strenuous activity but otherwise be up as tolerated. She is to call with any  questions or concerns regarding her care specifically any fevers greater  than 101.5 or increasing abdominal pain, nausea, vomiting or redness or  drainage from her incision. She understood and agreed to these instructions.   DISCHARGE MEDICATIONS:  The patient was instructed to resume her previous  medications. She was also give a prescription for hydrocodone and  acetaminophen as well as prescription for Phenergan. She was to take these  as needed.   FOLLOWUP:  Followup will be with Dr. Logan Bores in approximately 1 week for  staple removal.  FINAL PATHOLOGY RESULTS:  Renal angiomyolipoma.     ______________________________  Terie Purser, MD      Jamison Neighbor, M.D.  Electronically Signed    JH/MEDQ  D:  05/22/2006  T:  05/22/2006  Job:  [id number]

## 2011-04-14 NOTE — Op Note (Signed)
NAME:  [name], [name]                          ACCOUNT NO.:  [id number]   MEDICAL RECORD NO.:  [id number]                   PATIENT TYPE:  AMB   LOCATION:  ENDO                                 FACILITY:  [facility]   PHYSICIAN:  Petra Kuba, M.D.                 DATE OF BIRTH:  1961/07/01   DATE OF PROCEDURE:  03/09/2003  DATE OF DISCHARGE:                                 OPERATIVE REPORT   PROCEDURE PERFORMED:  Colonoscopy.   INDICATIONS FOR PROCEDURE:  Abdominal pain with bowel movements.  Consent  was signed after risks, benefits, methods and options were thoroughly  discussed in the office.   MEDICINES USED:  Demerol 80, Versed 8   DESCRIPTION OF PROCEDURE:  Rectal inspection was pertinent for external  hemorrhoids small.  Digital exam was negative.  Pediatric video adjustable  colonoscope was inserted and fairly easily advanced around the colon to the  cecum.  This did require rolling her on her back and some abdominal  pressures.  No obvious abnormality was seen on insertion.  The cecum was  identified by the appendiceal orifice and the ileocecal valve.  In fact the  scope was inserted a short ways into the terminal ileum which was normal.  Photo documentation was obtained.  The scope was slowly withdrawn.  The prep  was adequate.  On slow withdraw through the colon, tiny cecal polyp was seen  and was carefully hot biopsied x 2 and cold biopsied x 1 with the setting of  15/15.  The scope was slowly withdrawn.  Another tiny polyp in the mid  transverse was seen and hot biopsied on a setting of 20/20 and put in the  same container.  The scope was further withdrawn.  No additional findings  were seen as we slowly withdrew back to the rectum.  Once back in the rectum  a small polyp was seen, snared, electrocautery applied and the polyp was  suctioned through the scope and collected in the trap.  Possibly there was  some residual polypoid tissue which was cold biopsied x 1.  On  retroflexion  there was some small internal hemorrhoids.  The scope was straightened and  readvanced a short ways up the left side of the colon.  Air was suctioned.  The scope removed.  The patient tolerated the procedure well.  There was no  obvious immediate complication.   ENDOSCOPIC DIAGNOSES:  1. Internal and external hemorrhoids.  2. Small rectal polyp snared and then cold biopsied.  3. Transverse and cecal tiny polyps hot biopsied.  4. Otherwise within normal limits to the terminal ileum.    PLAN:  Await pathology to determine future colonic screening.  Consider  small bowel follow through to complete her workup versus proceeding with a  laparoscopy for adhesions which is the probable cause of disease.  Happy to  see back p.r.n.  Will touch base  and check on her when you review her  pathology.                                               Petra Kuba, M.D.    MEM/MEDQ  D:  03/09/2003  T:  03/09/2003  Job:  [id number]   cc:   Jamison Neighbor, M.D.  [address]  [place]  [place] [address]  Fax: [phone]

## 2011-04-14 NOTE — H&P (Signed)
[name], [name] NO.:  [id number]   MEDICAL RECORD NO.:  [id number]          PATIENT TYPE:  INP   LOCATION:  0001                         FACILITY:  [facility]   PHYSICIAN:  Jamison Neighbor, M.D.  DATE OF BIRTH:  05-11-1961   DATE OF ADMISSION:  05/18/2006  DATE OF DISCHARGE:                                HISTORY & PHYSICAL   CHIEF COMPLAINT:  Renal mass.   HISTORY OF PRESENT ILLNESS:  [name] is a 50 year old female who is  admitted today for left renal mass. She has a history of a right partial  nephrectomy for a kidney tumor. The final pathology of that lesion is  unknown at the time of this dictation. She has had a history of a medullary  sponge kidney and kidney stones. She was evaluated for hematuria by Dr.  Logan Bores. There was a lesion noted in the lower pole of the left kidney on CT  and MRI which had imaging characteristics which were worrisome for a renal  cell tumor. She has also a history of chronic cystitis but has been doing  well from that standpoint. She otherwise has no significant complaints.   ALLERGIES:  PENICILLIN.   MEDICATIONS:  Macrodantin.   PAST MEDICAL HISTORY:  1.  Kidney stones.  2.  Irritable bowel syndrome.  3.  Kidney tumor.   PAST SURGICAL HISTORY:  Cholecystectomy, partial nephrectomy on the right in  1999.   FAMILY HISTORY:  Significant for cancer in her father also high blood  pressure and kidney stones.   SOCIAL HISTORY:  The patient denies any alcohol, tobacco or drug use.   REVIEW OF SYSTEMS:  Multisystem review is performed and is as above  otherwise noted.   PHYSICAL EXAMINATION:  VITAL SIGNS:  Afebrile, vital signs stable.  GENERAL:  No acute distress.  HEART:  Regular rate and rhythm.  LUNGS:  Clear.  ABDOMEN:  Soft and nontender.  BACK/FLANKS:  Nontender.  PSYCHE:  Alert and oriented.  EXTREMITIES:  There is no cyanosis, clubbing or edema.   ASSESSMENT/PLAN:  [name] is a 50 year old female who has  an approximately  1-1/2 cm enhancing mass in the lower pole of the left kidney. She will be  admitted to the hospital today for left partial nephrectomy and admitted for  routine postoperative care.     ______________________________  Terie Purser, MD      Jamison Neighbor, M.D.  Electronically Signed    JH/MEDQ  D:  05/18/2006  T:  05/18/2006  Job:  [id number]

## 2011-04-14 NOTE — Op Note (Signed)
NAMEMarland Kitchen  [name], [name] NO.:  [id number]   MEDICAL RECORD NO.:  [id number]          PATIENT TYPE:  INP   LOCATION:  0001                         FACILITY:  [facility]   PHYSICIAN:  Jamison Neighbor, M.D.  DATE OF BIRTH:  May 09, 1961   DATE OF PROCEDURE:  05/18/2006  DATE OF DISCHARGE:                                 OPERATIVE REPORT   SERVICE:  Urology.   PREOPERATIVE DIAGNOSIS:  Left renal mass.   POSTOPERATIVE DIAGNOSIS:  Left renal mass, likely angiomyolipoma.   PROCEDURE PERFORMED:  1.  Left partial nephrectomy.  2  Intraoperative ultrasound.   ATTENDING SURGEON:  Jamison Neighbor, M.D.   ASSISTANT:  Terie Purser, M.D.   ANESTHESIA:  General endotracheal.   COMPLICATIONS:  None.   DRAINS:  Foley catheter to straight drain, JP drain to bulb suction.   SPECIMENS:  Left renal mass for frozen and permanent analysis.   ESTIMATED BLOOD LOSS:  200 mL.   COMPLICATIONS:  None.   DISPOSITION:  Stable to post anesthesia care unit.   INDICATIONS FOR PROCEDURE:  [name] is a 50 year old female who has been  followed by Dr. Logan Bores in the past.  She has a reported history of the  medullary sponge kidney.  She was recently undergone imaging of her abdomen  and pelvis and reveals presence of an approximate 1-1/2 to 2 cm mass in the  lower pole of the left kidney.  This mass has demonstrated  enhancement  characteristics worrisome for a renal cell carcinoma.  This was evaluated  both with CT and MR.  She also has a previous history of right partial  nephrectomy.  After full discussion of benefits and risks of procedure, the  patient has consented for left partial nephrectomy to address her renal  mass.   DESCRIPTION OF PROCEDURE:  The patient was brought to the operating room,  properly identified.  A time-out was performed confirming the correct  patient, procedure and side.  She was administered general anesthesia and  given preoperative antibiotics and then placed  in the left flank position  and prepped and draped in a sterile fashion.  We began the procedure making  an incision off the eleventh rib.  Dissection was carried down through the  subcutaneous tissues and the eleventh rib was exposed.  The overlying tissue  was dissected free of the rib and rib cutter was used to remove the rib at  the lateral portion of the incision.  We then proceeded through the muscle  layers individually. The retroperitoneum was exposed, entered with care  taken not to enter the peritoneum.  The retroperitoneal space was developed  and the kidney was isolated and mobilized.  We entered Gerota's fascia and  exposed the entire kidney.  The patient's mass was not significantly  exophytic on imaging.  There was no obvious mass of the ventral aspect of  the left kidney that was an identifiable tumor.  We subsequently brought a  intraoperative ultrasound into the room and performed ultrasound of the  kidney.  In the area of question, there was evidence  of an approximate 1-1/2  cm echogenic appearing tumor which was just under the capsule and abutting  some of the renal collecting system.  We felt that this represented the  tumor in question.  Further ultrasound imaging of the kidney did not  demonstrate any other identifiable lesion.  At this point, we isolated the  renal vein and secured this with a Vesseloop.  The renal artery was  dissected free just posterior to the vein and isolated.  We then used the  ultrasound to mark the region of the mass.  The capsule was scored with the  Bovie.  We then placed a bulldog clamp over the renal artery.  The tumor was  then excised using the blunt end of a knife blade.  This was done without  difficulty.  The specimen was then sent to pathology for frozen analysis.  Preliminary results revealed findings consistent with an angiomyolipoma.  At  this point, we used the argon beam to coagulate the tumor bed.  There was a  concern that  there was a small entry into the collecting system.  This area  was oversewn using figure-of-eight 4-0 Vicryl suture.  This appeared to  adequately close the area in question.  We then applied FloSeal and Tisseel  into the tumor bed.  The artery was unclamped.  Total clamp time was 19  minutes.  Hemostasis appeared to be adequate at this point.  There was no  significant bleeding.  We elected to placed a sheet of Surgicel covered with  perinephric fat over the excision site.  Hemostasis was excellent at this  point.  We then began to close.  A JP drain was placed in the  retroperitoneal space.  When we began to close the wound, there did appear  to be a small pleurotomy.  This was oversewn with a 3-0 Vicryl suture.  We  then proceeded to close the wound in three layers using one 1-0 PDS.  The  skin was closed with staples after irrigation.  All sponge and needle counts  were correct at the end of the case.  There were no complications.  Please  note that Dr. Logan Bores was present and participated in all aspects of this  procedure as he is the primary surgeon.  The patient was then awoken from  anesthesia and transported to the recovery room in stable condition.     ______________________________  Terie Purser, MD      Jamison Neighbor, M.D.  Electronically Signed    JH/MEDQ  D:  05/18/2006  T:  05/18/2006  Job:  [id number]

## 2011-04-14 NOTE — Discharge Summary (Signed)
[facility]  Patient:    ARRYN, [name]                       MRN: [id number] Adm. Date:  [id number] Disc. Date: [id number] Attending:  Caleen Essex                           Discharge Summary  REASON FOR ADMISSION:  [name] is a 50 year old white female with no identifiable risk factors for pancreatitis who presented several days ago with epigastric and right upper quadrant pain and was found to have a lipase in the high 700 range.  She was admitted for bowel rest and IV hydration.  A gastroenterology consult was obtained, and they performed an ERCP on her which was normal.  She had no evidence of gallstones as the etiology of her pancreatitis.   She had a normal triglyceride level and did not use any alcohol products.  Her pain worsened somewhat in the days following the ERCP, but then continued to improve again to the point where she could tolerate a liquid diet without difficulty.  At this point, she wanted to go home, and she was stable for discharge.  CONDITION ON DISCHARGE:  Stable.  DISCHARGE MEDICATIONS: 1. Resume her home medications. 2. Vicodin 1-2 p.o. q.4-6h. p.r.Webb.  DIET:  Low-fat.  FOLLOW-UP:  She will call for follow-up with either myself or Dr. Kinnie Scales in gastroenterology in the next couple of weeks. DD:  07/18/00 TD:  07/18/00 Job: [id number]

## 2011-04-14 NOTE — Procedures (Signed)
[facility]  Patient:    [name], [name]                       MRN: [id number] Proc. Date: 07/16/00 Adm. Date:  [id number] Attending:  Caleen Essex CC:         Chevis Pretty, M.D.   Procedure Report  PROCEDURE PERFORMED:  Endoscopic retrograde cholangiopancreatography.  ENDOSCOPIST:  Griffith Citron, M.D.  INDICATIONS:  This is a 50 year old white female undergoing an ERCP to evaluate etiology of recent bout of acute pancreatitis.  Hospitalized four days ago with marked elevation of amylase, lipase and right upper quadrant pain.  Symptoms subsided over the past several days.  Abdominal ultrasound revealed pancreatic duct dilation, no evidence of hepatobiliary inflammation or obstruction.  DESCRIPTION OF PROCEDURE:  After reviewing the nature of the procedure with the patient including potential risks of unsuccessful procedure, pancreatitis, cholangitis, perforation and hemorrhage, informed consent was signed.  The patient was premedicated receiving IV sedation totalling fentanyl 75 mcg and Versed 10 mg IV.  Using an Olympus video duodenoscope, the proximal esophagus was intubated under direct vision.  The scope was advanced into the stomach.  A retroflex view of the cardia, lesser curve and antrum all normal.  The pylorus symmetric and easily traversed.  The scope positioned in the second portion of the duodenum.  The ampulla appeared normal.  Free cannulation of the common bile duct revealed a normal distal common bile duct, common hepatic duct, right and left branches and intrahepatic radicles.  No evidence for intraluminal filling defect to suggest gallstones.  The cystic duct was patent filling a normal appearing gallbladder.  The scope was reoriented and a pancreatogram was easily obtained.  This revealed normal smooth tapering without ductal dilation.  No evidence of mass or other finding.  Both ducts rapidly drained as followed endoscopically.   The patient was returned to recovery in stable condition having been maintained on low flow oxygen and monitor throughout.  ASSESSMENT: 1. Normal endoscopic retrograde cholangiopancreatography. 2. Acute idiopathic pancreatitis.  RECOMMENDATIONS: 1. Post endoscopic retrograde cholangiopancreatography order is    written. 2. Advance diet gradually to low fat. 3. Low fat diet for one month. 4. Pancreatic enzyme supplements to be used for the next month with    meals. DD:  07/16/00 TD:  07/16/00 Job: [id number]

## 2011-04-14 NOTE — Op Note (Signed)
[facility]  Patient:    [name], [name]                       MRN: [id number] Proc. Date: 10/15/00 Adm. Date:  [id number] Attending:  Chevis Pretty S                           Operative Report  PREOPERATIVE DIAGNOSES:  Questionable gallstone pancreatitis.  POSTOPERATIVE DIAGNOSES:  Questionable gallstone pancreatitis.  PROCEDURE:  Laparoscopic cholecystectomy.  SURGEON:  Dr. Carolynne Edouard.  ASSISTANT:  Dr. Orson Slick.  ANESTHESIA:  General endotracheal.  DESCRIPTION OF PROCEDURE:  After informed consent was obtained, the patient was taken to the operating room and placed in the supine position on the operating room table. After adequate induction of general anesthesia, the patients abdomen was prepped with Betadine and draped in the usual sterile manner. A small transverse infraumbilical incision was made to the skin with a 15 blade knife. This incision was carried down through the subcutaneous tissue with blunt dissection using a Kelly clamp and [employer] retractors until the fascia of the midline was encountered. This fascia was then incised with a 15 blade knife and each side was grasped with Kocher clamps and elevated. The hole in the fascia was then probed with a Kelly clamp bluntly until entry was gained into the abdominal cavity. A finger was inserted through this hole and palpation of the anterior abdominal wall did not reveal any adhesions. A #0 Vicryl pursestring suture was then placed in the fascia around this hole and a Hasson cannula was placed through this opening into the abdominal cavity. The abdomen was then insufflated with carbon dioxide. A small transverse upper midline incision was then made with the 15 blade knife and a 10 mm port was placed through this incision into the abdominal cavity under direct vision. Using the laparoscope, 2 small 5 mm incisions were made out laterally on the right side of the abdomen with the 15 blade knife and again  two 5 mm ports were placed through the incisions into the abdominal cavity again under direct vision using the laparoscope. A blunt grasper was then placed through the lateral most port and used to grasp the dome of the gallbladder and elevate it anteriorly and superiorly. Another blunt grasper was placed through the other lateral port and used to retract laterally on the body and neck of the gallbladder. There were several adhesions of omentum to the surface of the gallbladder and these were taken down both bluntly with a [place] dissector placed through the upper midline port as well as with some Bovie dissection also using the [employer]. Once this was complete, blunt dissection was used the open the peritoneal reflection at the junction of the neck of the gallbladder and the cystic duct. This area of the gallbladder neck and cystic duct was dissected bluntly in a circumferential manner until the junction was easily identified. Care was taken to make sure the common duct was medial to this dissection. Three clips were placed proximally on the cystic duct and one distally on the gallbladder side and the cystic duct was then divided between the two. Posterior to this, the cystic artery was identified and also directed bluntly in a circumferential manner, 2 clips were placed proximally and 1 distally on the artery and the artery was divided between the 2. Once this was complete, dissection using the hook cautery was used to remove  the gallbladder from the bed of the liver. Once this was done, the bed of the liver was then inspected and hemostasis was excellent. The laparoscope was then moved to the upper midline port and a gallbladder grasper was placed through the Hasson cannula. The neck of the gallbladder was grasped with the gallbladder grasper and the gallbladder was removed through the umbilical hole through the umbilical port. The Hasson cannula was then replaced into this  hole and the abdomen was again inspected. The liver bed was irrigated with saline and effluent was clear. All the ports were then removed under direct vision and the fascial defect at the umbilical port was then closed with the previously placed pursestring Vicryl stitch. The skin was closed with interrupted 4-0 monocryl subcuticular stitches. Benzoin and Steri-Strips and sterile dressings were applied. The patient tolerated the procedure well. At the end of the case, all needle, sponge, and instrument counts were correct. The patient was awakened and taken to the recovery room in stable condition. DD:  10/15/00 TD:  10/16/00 Job: [id number]

## 2011-04-14 NOTE — H&P (Signed)
[name]  Patient:    [name], [name]                       MRN: [id number] Adm. Date:  [id number] Attending:  Caleen Essex                         History and Physical  HISTORY OF PRESENT ILLNESS:  [name] is a 50 year old white female who presents to the emergency room with a couple day history of epigastric and right upper quadrant pain, nausea and vomiting.  She states the pain began suddenly a couple of days ago.  She has been unable to keep much in the way of liquids or food down.  She has had several episodes of vomiting over the last day or two.  She has been complaining of pain kind of in the middle of her abdomen in the right upper quadrant.  The pain comes and goes and has doubled her over at times in the last couple of days, but is currently bearable.  She denies any fevers, chills, chest pain, shortness of breath, diarrhea or dysuria.  REVIEW OF SYSTEMS:  Her other review of systems is unremarkable.  PAST MEDICAL HISTORY:  Significant for a tumor on her right kidney.  PAST SURGICAL HISTORY:  Excision of the tumor on her right kidney.  MEDICATIONS:  None.  ALLERGIES:  Allergic to PENICILLIN.  SOCIAL HISTORY:  She denies any use of alcohol or tobacco.  FAMILY HISTORY:  Significant for lung cancer in her father.  PHYSICAL EXAMINATION:  VITAL SIGNS:  Blood pressure 113/76, pulse 67, temperature 97.7.  GENERAL:  She is a well-developed, well-nourished white female in no acute distress.  SKIN:  Warm and dry.  HEENT:  Extraocular muscles are intact.  Pupils are equal, round and reactive to light.  NECK:  No bruits.  LUNGS:  Clear to auscultation bilaterally.  HEART:  Regular rate and rhythm with a 2/6 systolic murmur.  ABDOMEN:  Soft with tenderness to palpation in the epigastric and right upper quadrant areas without any peritoneal signs.  She has normal bowel sounds. She has a well-healed right flank scar.  EXTREMITIES:   Show no clubbing, cyanosis, or edema with good peripheral pulses.  NEUROLOGIC:  Alert and oriented x4.  HEMATOLOGIC:  She has no palpable lymphadenopathy.  LABORATORY DATA:  She has significant lipase of 739.  Her white count is 7000 with 53% segmented neutrophils and 39% lymphocytes.  Her hemoglobin 12.2, hematocrit 34.4, platelet count 177,000.  Sodium 140, potassium 3.3, chloride 109, CO2 25, BUN 7, creatinine 0.7, glucose 88, total bilirubin 0.8, alkaline phosphatase 49, SGOT 17.  She had ultrasound done yesterday that showed no evidence of gallstones or cholecystitis and was otherwise normal.  ASSESSMENT/PLAN:  This is a 50 year old white female with pancreatitis.  We will obtain repeat amylase and lipase to confirm that the pancreatitis is the etiology of her pain.  We will also check her triglyceride level as a possible source of her pancreatitis.  We will admit her to the [facility] for bowel rest and IV hydration.  I will also ask the gastroenterologists to see her and give their recommendations. DD:  07/13/00 TD:  07/14/00 Job: [id number]

## 2011-04-14 NOTE — Consult Note (Signed)
[facility]  Patient:    [name], [name]                       MRN: [id number] Proc. Date: 07/13/00 Adm. Date:  [id number] Attending:  Caleen Essex CC:         Chevis Pretty, M.D.  Elvina Sidle, M.D.   Consultation Report  GASTROENTEROLOGY CONSULTATION  INDICATION:  I was asked to see Ms. Esquivias by Dr. Chevis Pretty to assist in the evaluation of her acute pancreatitis.  HISTORY OF PRESENT ILLNESS:  Patient has been in excellent health until this week.  Very nonspecific vague complaints of decreased energy earlier this week, also with daily bowel movement which is unusual, more typically having a bowel movement every two to three days.  Yesterday, at 11 a.m., patient had acute onset of excruciating right upper quadrant pain radiating into the right flank.  It was relatively short-lived, only 45 minutes, and then resolved completely.  The patient ate lunch and within an hour, had recurrence of pain even worse in severity and without resolution.  Another severe wave at 5 p.m. ultimately brought her to the [facility] Emergency Room.  She was evaluated and found to have elevated amylase and lipase.  An abdominal ultrasound was reportedly normal.  Over the past 36 hours, the patient has had five severe episodes associated with nausea and vomiting.  She has felt feverish without rigor but with chilling and diaphoresis.  She was seen by her family physician, Dr. Elvina Sidle of [facility], earlier today and was instructed to come to the [facility] Emergency Room for further evaluation. She was seen this evening by Dr. Chevis Pretty, who is admitting her for further evaluation and symptomatic treatment and support.  The patient has no risk factors for pancreatitis.  She denies alcohol.  On no medications.  There is no family history of pancreatitis.  She does not have known hyperlipidemia.  She has not sustained any abdominal trauma.  Risks  factors for gallbladder disease include age, gender and fecundity.  PAST MEDICAL HISTORY:  Significant for a right renal tumor resection performed by Dr. Jamison Neighbor, 1998, benign pathology.  Status post hysterectomy.  ALLERGIES:  PENICILLIN.  CURRENT MEDICAL REGIMEN:  None.  REVIEW OF SYSTEMS:  Overall health, energy, appetite excellent.  Recent swelling of hands and feet this past week.  Increased frequency of bowel movements this past week.  Otherwise, noncontributory.  SOCIAL HISTORY:  Patient is married.  Two children, ages 61 and 3, both healthy.  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  No tobacco or alcohol use.  FAMILY HISTORY:  Significant for absence of pancreatitis.  PHYSICAL EXAMINATION  GENERAL:  Anxious, healthy, alert, oriented, no acute distress, resting comfortably.  VITAL SIGNS:  Stable; temperature 97.7, respirations 12, pulse 64, BP 110/76.  HEENT:  Anicteric sclerae.  Pink conjunctivae.  No pallor.  No oropharyngeal lesion.  NECK:  Supple.  No adenopathy, thyromegaly or bruit.  CHEST:  Clear to auscultation.  BACK:  Right CVA tenderness.  ABDOMEN:  Soft.  Marked tenderness in the epigastrium and right upper quadrant.  Patient unwilling to take a deep breath to test for a Murphys sign.  No palpable gallbladder or hepar edge.  No splenomegaly obvious.  Bowel sounds are active without borborygmi, bruit or splash.  No firmness.  No mass.  RECTAL:  Not performed.  EXTREMITIES:  No clubbing, cyanosis,  or edema.  SKIN:  Multiple small skin lesions over the torso; many of these have been removed.  They are too small to determine whether there is central umbilication, but these are suggestive of neurofibromatosis.  LABORATORY DATA:  CBC with differential normal including WBC 7000, hemoglobin 12.8, hematocrit 37.1.  Lipase 739.  Urinalysis normal.  BMET, HFP -- normal.  ASSESSMENT 1. Acute pancreatitis -- patients clinical presentation  is much more    consistent with gallstones disease than with pancreatic disease.  The    episodic nature of her pain, right upper quadrant localization, description    of intense excruciating pain lasting for several hours, then relenting,    nausea and vomiting associated with worsening pain.  Physical    examination with marked right-sided tenderness, active bowel sounds,    nondistended also suggest biliary over pancreatic etiology of her clinical    presentation.  She appears too comfortable and healthy for a patient with    moderate or severe pancreatitis.  Even more mild pancreatitis produces a    more constant pain syndrome, which the patient obviously is not    experiencing at the time of this consultation.  Furthermore, she does not    have risk factors for pancreatitis and does have several for gallstone    disease; the caveat is her normal liver function tests and abdominal    ultrasound. 2. Skin lesions -- possible neurofibromatosis.  We should obtain the pathology    from her renal tumor resection to rule out possible neurofibromas causing    biliary and/or pancreatic duct obstruction.  RECOMMENDATION 1. Agree with Dr. Caralyn Guile plan for IV fluids, analgesia, antiemetics, bowel    rest and observation. 2. Consider ERCP if clinical symptom subsides.  If it should worsen, I would    order an urgent MRCP and if common bile duct stone identified, then proceed    with ERCP for the purpose of stone extraction.  I discussed this with the    patient and her husband, who is present throughout the consultation. 3. I will follow the patient along over the weekend and discuss possible role    or ERCP, pending her clinical course. DD:  07/13/00 TD:  07/15/00 Job: [id number]

## 2011-09-12 ENCOUNTER — Encounter: Payer: Self-pay | Admitting: Family Medicine

## 2011-09-12 ENCOUNTER — Ambulatory Visit (INDEPENDENT_AMBULATORY_CARE_PROVIDER_SITE_OTHER): Payer: 59 | Admitting: Family Medicine

## 2011-09-12 VITALS — BP 150/100 | HR 73 | Wt 174.0 lb

## 2011-09-12 DIAGNOSIS — M79609 Pain in unspecified limb: Secondary | ICD-10-CM

## 2011-09-12 DIAGNOSIS — R030 Elevated blood-pressure reading, without diagnosis of hypertension: Secondary | ICD-10-CM

## 2011-09-12 DIAGNOSIS — M2669 Other specified disorders of temporomandibular joint: Secondary | ICD-10-CM

## 2011-09-12 DIAGNOSIS — M26629 Arthralgia of temporomandibular joint, unspecified side: Secondary | ICD-10-CM

## 2011-09-12 DIAGNOSIS — M79673 Pain in unspecified foot: Secondary | ICD-10-CM

## 2011-09-12 DIAGNOSIS — R51 Headache: Secondary | ICD-10-CM

## 2011-09-12 DIAGNOSIS — IMO0001 Reserved for inherently not codable concepts without codable children: Secondary | ICD-10-CM

## 2011-09-12 DIAGNOSIS — Z23 Encounter for immunization: Secondary | ICD-10-CM

## 2011-09-12 NOTE — Progress Notes (Signed)
  Subjective:    Patient ID: [name], female    DOB: 04/22/1961, 50 y.o.   MRN: [id number]  HPI She is here for multiple issues. She does have a history of headaches that are intermittent in nature and sometimes require medications. She did have her blood pressure checked recently and it was elevated. She also complains of a three-day history of left earache as well as left jaw pain. No fever, chills, sore throat some chest congestion. She does not smoke. She also complains of right heel pain. She has no history of injury to it she does wear steel toe shoes.  Review of Systems     Objective:   Physical Exam alert and in no distress. Tympanic membranes and canals are normal. Throat is clear. Tonsils are normal. Neck is supple without adenopathy or thyromegaly. Cardiac exam shows a regular sinus rhythm without murmurs or gallops. Lungs are clear to auscultation. Left TMJ is tender to palpation Exam of the right heel does show some strain as well as some discoloration at the posterior aspect of the heel. Achilles tendon and calcaneus is normal. Good motion of the ankle.     Assessment & Plan:   1. TMJ pain dysfunction syndrome   2. Elevated blood pressure   3. Heel pain   4. Headache    she is to use Advil 800 mg 3 times a day, heat to the jaw and keep softer foods. Recheck her blood pressure in one month. Discussed exercise and salt restriction. Use heel cups for the feet. Continue to treat the headache as she always does.

## 2011-09-12 NOTE — Patient Instructions (Addendum)
Use heat 20 minutes 3 times per day, Advil 4 tablets 3 times a day. Eat softer foods area and if it keeps bothering you check with your dentist. Return here in about a month for blood pressure recheck. Make sure you don't take in too much salt Use heel cups for your heels; both of them Continue treat your headache like you normally do.

## 2011-10-03 ENCOUNTER — Ambulatory Visit (INDEPENDENT_AMBULATORY_CARE_PROVIDER_SITE_OTHER): Payer: 59 | Admitting: Physician Assistant

## 2011-10-03 ENCOUNTER — Encounter: Payer: Self-pay | Admitting: Family Medicine

## 2011-10-03 ENCOUNTER — Telehealth: Payer: Self-pay | Admitting: Internal Medicine

## 2011-10-03 ENCOUNTER — Encounter (HOSPITAL_COMMUNITY): Payer: Self-pay | Admitting: Physician Assistant

## 2011-10-03 ENCOUNTER — Ambulatory Visit (INDEPENDENT_AMBULATORY_CARE_PROVIDER_SITE_OTHER): Payer: 59 | Admitting: Family Medicine

## 2011-10-03 VITALS — BP 140/90 | HR 74 | Temp 97.6°F | Wt 181.0 lb

## 2011-10-03 DIAGNOSIS — J029 Acute pharyngitis, unspecified: Secondary | ICD-10-CM

## 2011-10-03 DIAGNOSIS — F329 Major depressive disorder, single episode, unspecified: Secondary | ICD-10-CM

## 2011-10-03 DIAGNOSIS — Z62820 Parent-biological child conflict: Secondary | ICD-10-CM

## 2011-10-03 DIAGNOSIS — J209 Acute bronchitis, unspecified: Secondary | ICD-10-CM

## 2011-10-03 DIAGNOSIS — Z630 Problems in relationship with spouse or partner: Secondary | ICD-10-CM

## 2011-10-03 DIAGNOSIS — F32A Depression, unspecified: Secondary | ICD-10-CM

## 2011-10-03 DIAGNOSIS — Z7189 Other specified counseling: Secondary | ICD-10-CM

## 2011-10-03 LAB — POCT RAPID STREP A (OFFICE): Rapid Strep A Screen: NEGATIVE

## 2011-10-03 MED ORDER — MIRTAZAPINE 15 MG PO TABS: 15.0000 mg | ORAL_TABLET | Freq: Every day | ORAL | Status: DC

## 2011-10-03 MED ORDER — LEVOFLOXACIN 500 MG PO TABS: 500.0000 mg | ORAL_TABLET | Freq: Every day | ORAL | Status: DC

## 2011-10-03 NOTE — Telephone Encounter (Signed)
Called in tussionex 4oz take teaspoon every 12 hours needed for cough

## 2011-10-03 NOTE — Progress Notes (Signed)
  Subjective:    Patient ID: [name], female    DOB: 10-25-1961, 50 y.o.   MRN: [id number]  HPI Approximately 2 days ago she had the onset of fatigue. Yesterday she had sore throat, nasal congestion, dry cough, sinus pressure, earache. She did develop a fever this morning. She does not smoke. She has had multiple sick contacts at work.   Review of Systems     Objective:   Physical Exam alert and in no distress. Tympanic membranes and canals are normal. Throat is clear. Tonsils are normal. Neck is supple without adenopathy or thyromegaly. Cardiac exam shows a regular sinus rhythm without murmurs or gallops. Lungs are clear to auscultation. Screen is negative       Assessment & Plan:   1. Bronchitis, acute  POCT rapid strep A  2. Pharyngitis, acute     I discussed treatment with her and she would prefer to be placed on medication. I will give her Levaquin and Tussionex.

## 2011-10-03 NOTE — Patient Instructions (Signed)
Pt. To have her labs drawn as written.  Follow up in 3-6 weeks. She and her husband will seek joint marriage therapy. She will start individual therapy.

## 2011-10-03 NOTE — Patient Instructions (Signed)
You can use Tylenol, Advil or Aleve for aches and pains. Take all the antibiotic and if not totally better call me.

## 2011-10-04 ENCOUNTER — Encounter: Payer: Self-pay | Admitting: Family Medicine

## 2011-10-04 MED ORDER — MIRTAZAPINE 15 MG PO TABS: 15.0000 mg | ORAL_TABLET | Freq: Every day | ORAL | Status: DC

## 2011-10-04 NOTE — Progress Notes (Signed)
ID:  Pt. Is a 49 yr.old MWF.  HPI: Pt. Reports that she is in to establish care and to get a refill on her medication she received while in patient in [facility] after a suicide attempt last month.  Past Psych History: None Inpatient: 1 week as noted above. Outpatient: None Previous psych meds: None  PMH: See current hx: Meds: Remeron 15mg  1 po q d.  Stressors:     The patient states her symptoms of depression started about a year ago.  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      She took the [place] job due to increased financial difficulties.  Other stressors include their last child leaving for college.       The patient notes that the largest stressor was the extramarital affair she entered into.  The affair lasted for over a year and did not end even though her husband had hired an Pensions consultant and confronted her.  It continued for 4 more months when she and her partner decided to end the relationship.       She attempted suicide after her 19 yr. Old daughter received a txt that was supposedly sent from her mother's phone.  The text mentioned that she was attracted to a man at work.  The daughter forwarded the txt to her father.  Connie's husband immediately confronted her about this and they argued.        Connie at that time left and headed to [place] [place].  Enroute she stopped at a truck stop and OD'd and slashed her wrists.  She woke up in the hospital.    Mental Status exam:     Pt. States she is doing ok.  She states that her suicide attempt was impulsive and she regrets it very much.  She denies intent to commit suicide and has no SI/HI. She has no plans or thoughts at this time.  She is alert and oriented x 3.     She denies AH/VH and is in full contact with reality.  Her judgement is good and her insight is good.     Axis I. Major depressive disorder            Marital stress            Parent child  relational problem Axis II. N/A Axis III. [place], Asthma, weight Axis IV: Financial stressors Axis V: GAF: 60  Plan: Pt. To continue Remeron as written.           Individual OPT            Marital Counseling           Follow up 2-4 weeks.

## 2011-10-10 ENCOUNTER — Telehealth (HOSPITAL_COMMUNITY): Payer: Self-pay

## 2011-10-16 ENCOUNTER — Ambulatory Visit (INDEPENDENT_AMBULATORY_CARE_PROVIDER_SITE_OTHER): Payer: 59 | Admitting: Family Medicine

## 2011-10-16 ENCOUNTER — Encounter: Payer: Self-pay | Admitting: Family Medicine

## 2011-10-16 VITALS — BP 126/80 | HR 62 | Wt 180.0 lb

## 2011-10-16 DIAGNOSIS — F329 Major depressive disorder, single episode, unspecified: Secondary | ICD-10-CM

## 2011-10-16 DIAGNOSIS — F3289 Other specified depressive episodes: Secondary | ICD-10-CM

## 2011-10-16 DIAGNOSIS — J209 Acute bronchitis, unspecified: Secondary | ICD-10-CM

## 2011-10-16 LAB — CBC WITH DIFFERENTIAL/PLATELET
Basophils Relative: 1 % (ref 0–1)
Eosinophils Relative: 2 % (ref 0–5)
HCT: 41.1 % (ref 36.0–46.0)
Lymphs Abs: 1.8 10*3/uL (ref 0.7–4.0)
MCH: 29.1 pg (ref 26.0–34.0)
MCHC: 32.6 g/dL (ref 30.0–36.0)
MCV: 89.2 fL (ref 78.0–100.0)
Monocytes Relative: 5 % (ref 3–12)
RBC: 4.61 MIL/uL (ref 3.87–5.11)

## 2011-10-16 LAB — COMPREHENSIVE METABOLIC PANEL
ALT: 14 U/L (ref 0–35)
Albumin: 4.2 g/dL (ref 3.5–5.2)
Alkaline Phosphatase: 90 U/L (ref 39–117)
Chloride: 102 mEq/L (ref 96–112)
Creat: 0.77 mg/dL (ref 0.50–1.10)
Total Protein: 6.8 g/dL (ref 6.0–8.3)

## 2011-10-16 LAB — TSH: TSH: 1.335 u[IU]/mL (ref 0.350–4.500)

## 2011-10-16 MED ORDER — LEVOFLOXACIN 500 MG PO TABS: 500.0000 mg | ORAL_TABLET | Freq: Every day | ORAL | Status: AC

## 2011-10-16 NOTE — Progress Notes (Signed)
  Subjective:    Patient ID: [name], female    DOB: 04-Feb-1961, 50 y.o.   MRN: [id number]  HPI She is here for a consultation. She continues to be followed at behavioral health. They're treating her for depression and would like blood work to ensure no medical conditions complicating this. She is recovering from bronchitis but still having some cough and congestion.   Review of Systems     Objective:   Physical Exam Alert and in no distress. Lungs are clear to auscultation.       Assessment & Plan:   1. Depressive disorder, not elsewhere classified  Vitamin D 25 hydroxy, CBC with Differential, Comprehensive metabolic panel, TSH  2. Bronchitis, acute  CBC with Differential, Comprehensive metabolic panel

## 2011-10-17 LAB — VITAMIN D 25 HYDROXY (VIT D DEFICIENCY, FRACTURES): Vit D, 25-Hydroxy: 75 ng/mL (ref 30–89)

## 2011-10-24 ENCOUNTER — Encounter (HOSPITAL_COMMUNITY): Payer: Self-pay | Admitting: Physician Assistant

## 2011-10-24 ENCOUNTER — Ambulatory Visit (INDEPENDENT_AMBULATORY_CARE_PROVIDER_SITE_OTHER): Payer: 59 | Admitting: Physician Assistant

## 2011-10-24 DIAGNOSIS — Z630 Problems in relationship with spouse or partner: Secondary | ICD-10-CM

## 2011-10-24 DIAGNOSIS — F329 Major depressive disorder, single episode, unspecified: Secondary | ICD-10-CM

## 2011-10-24 DIAGNOSIS — F32A Depression, unspecified: Secondary | ICD-10-CM | POA: Insufficient documentation

## 2011-10-24 MED ORDER — CITALOPRAM HYDROBROMIDE 10 MG PO TABS: 10.0000 mg | ORAL_TABLET | Freq: Every day | ORAL | Status: DC

## 2011-10-24 MED ORDER — TRAZODONE 25 MG HALF TABLET: 25.0000 mg | ORAL_TABLET | Freq: Every evening | ORAL | Status: DC | PRN

## 2011-10-24 NOTE — Patient Instructions (Signed)
Pt. Is to have a regular bedtime each day for the next 30 days. She is also to scheduled exercise time each day. She is to start outpatient therapy ASAP.

## 2011-10-24 NOTE — Progress Notes (Signed)
  [facility] [address] Progress Note  [name] [id number] 50 y.o.  10/24/2011 10:20 AM  Chief Complaint: Medication refill:  History of Present Illness: Pt. States the medication made her sleepy and she could not focus at work. Suicidal Ideation: No Plan Formed: No Patient has means to carry out plan: No  Homicidal Ideation: No Plan Formed: No Patient has means to carry out plan: No  Review of Systems: Psychiatric: Agitation: Yes Hallucination: No Depressed Mood: Yes Insomnia: Yes Hypersomnia: No Altered Concentration: Yes Feels Worthless: Yes Grandiose Ideas: No Belief In Special Powers: No New/Increased Substance Abuse: No Compulsions: No  Neurologic: Headache: Yes Seizure: No Paresthesias: No  Past Medical Family, Social History:   Outpatient Encounter Prescriptions as of 10/24/2011  Medication Sig Dispense Refill  . estradiol (ESTRACE) 2 MG tablet Take 2 mg by mouth daily.        Marland Kitchen levofloxacin (LEVAQUIN) 500 MG tablet Take 1 tablet (500 mg total) by mouth daily.  10 tablet  0  . mirtazapine (REMERON) 15 MG tablet Take 1 tablet (15 mg total) by mouth at bedtime.  30 tablet  1  . sulfamethoxazole-trimethoprim (BACTRIM DS) 800-160 MG per tablet       . trimethoprim (TRIMPEX) 100 MG tablet Take 100 mg by mouth daily.          Past Psychiatric History/Hospitalization(s): yes due to overdose Anxiety: Yes Bipolar Disorder: No Depression: Yes Mania: No Psychosis: No Schizophrenia: No Personality Disorder: No Hospitalization for psychiatric illness: No History of Electroconvulsive Shock Therapy: No Prior Suicide Attempts: Yes  Physical Exam: Constitutional:  BP 141/95  Pulse 69  Temp(Src) 97.1 F (36.2 C) (Temporal)  Ht 5' 5.5" (1.664 m)  Wt 185 lb 9.6 oz (84.188 kg)  BMI 30.42 kg/m2  General Appearance: alert, oriented, no acute distress and well nourished  Musculoskeletal: Strength & Muscle Tone: within normal limits Gait &  Station: normal Patient leans: N/A  Psychiatric: Speech (describe rate, volume, coherence, spontaneity, and abnormalities if any): normal  Thought Process (describe rate, content, abstract reasoning, and computation): normal  Associations: Coherent  Thoughts: normal  Mental Status: Orientation: oriented to person, place, time/date and situation Mood & Affect: depressed affect Attention Span & Concentration: fair  Medical Decision Making (Choose Three): Review or order clinical lab tests (1) and Established Problem, Worsening (2)  Assessment: Axis I: Major depressive disorder severe without psychosis  Axis II:   Axis III: Hypothyroid, +ANA,   Axis IV: Marital discord  Axis V: GAF: 55   Plan: Will start an SSRI           Discuss sleep hygiene          Psychoeducation regarding good communication.           Follow up 1-2 weeks.  Luisa Louk, PA 10/24/2011

## 2011-11-06 ENCOUNTER — Ambulatory Visit (INDEPENDENT_AMBULATORY_CARE_PROVIDER_SITE_OTHER): Payer: 59 | Admitting: Physician Assistant

## 2011-11-06 DIAGNOSIS — F329 Major depressive disorder, single episode, unspecified: Secondary | ICD-10-CM

## 2011-11-06 NOTE — Progress Notes (Signed)
   [facility] Follow-up Outpatient Visit  DERYN MASSENGALE September 21, 1961  Date: 11/06/11   Subjective: The patient was seen with her husband. She feels that she is already beginning to feel some positive results from the Celexa. She is sleeping better on the trazodone, and reports she has no side effects. She is experiencing headaches 5 days a week, and is uncertain of their cause. When asked, she says that she probably does not stay as hydrated she could. She denies any recent suicidal or homicidal thoughts. She denies any auditory or visual hallucinations. She plans to begin one to one therapy with Marilynn Latino this week. Her husband stated that he does not feel that couples therapy would be helpful at this time.  There were no vitals filed for this visit.  Mental Status Examination  Appearance: Well groomed and casually dressed Alert: Yes Attention: good  Cooperative: Yes Eye Contact: Good Speech: Clear and even Psychomotor Activity: Normal Memory/Concentration: Intact Oriented: person, place, time/date and situation Mood: Depressed, mildly Affect: Congruent Thought Processes and Associations: Linear Fund of Knowledge: Good Thought Content:  Insight: Fair Judgement: Fair  Diagnosis: Depressive disorder, NOS  Treatment Plan: We will continue her current medications as prescribed by Verne Spurr, and we will followup in one month. The patient may call if necessary.  Eulla Kochanowski, PA

## 2011-11-09 ENCOUNTER — Ambulatory Visit (INDEPENDENT_AMBULATORY_CARE_PROVIDER_SITE_OTHER): Payer: 59 | Admitting: Psychology

## 2011-11-09 DIAGNOSIS — F322 Major depressive disorder, single episode, severe without psychotic features: Secondary | ICD-10-CM

## 2011-11-09 NOTE — Progress Notes (Signed)
   THERAPIST PROGRESS NOTE  Session Time: 1500 - 1600  Participation Level: Active  Behavioral Response: Well GroomedAlertDepressed  Type of Therapy: Individual Therapy  Treatment Goals addressed: Communication: with husband, assertively and Coping  Interventions: Strength-based, Assertiveness Training and Reframing  Summary: [name] is a 50 y.o. female who presents with goal of therapy for treatment of depression related to financial and marital issues.  [name] was hospitalized Oct. 8, 2012 following a suicide attempt consisting of and overdose (84 pills) and cuts to both wrists.  This was a serious attempt, with intention to die, and in a location where she did not expect to be found, and was not found for 24 hours   Due to a variety of circumstances, she and her husband had accumulated unmanageable credit card debt over the past 10 years since he had a heart attack.  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  In addition, after she started work outside the home 2 years ago, she had an affair with a Radio broadcast assistant.  Even though it ended, she felt her husband would never trust her again and the atmosphere at home had become unbearable.  Since that attempt, she has not known whether she survived for some special purpose or to be punished.  She has been married 20 years and together they have raised 4 children.  The youngest is a daughter, a sophomore in college currently.  The next youngest will graduate from college this month and join the [employer]. The oldest two are already doing well in their own lives.  Since the hospital stay, "my husband was really good the first month," but he says that he is once again making comments about her being with another man if she is even a few minutes late, or goes somewhere other than straight home from work.  She does still work the same shift as the married man she had the affair with,  but "we are just good friends".  She is afraid that her husbands words may once again drive to attempt to take her life.  He works 2nd shift in another city, so their time together is limited to the late mornings, early afternoons during the week.  We reframed his words to be emotional abuse and talked about ways to speak to him using I language and assertiveness skills with the goal that he would choose to change his behavior.  She does think about divorce, even thinking right now she would feel a lot better if she were living alone.  But the hassle and the financial entanglements and her desire to "make the marriage work" keep her at home.  She demonstrates good insight and judgement at this time and is willing to work in therapy on assertiveness as well as setting appropriate limits.  She is doing better on the change of medication, but still having frequent tearfulness.  The overeating and sense of being in a fog is gone since stopping Remeron.  She has broad affect, depressed mood and no evidence of formal thought disorder.  Suicidal/Homicidal: Nowithout intent/plan  Therapist Response: NA  Plan: Return again in 4 weeks.  Diagnosis: Axis I: Major Depression, single episode    Axis II: Deferred    [facility], RN 11/09/2011

## 2011-12-13 ENCOUNTER — Ambulatory Visit (INDEPENDENT_AMBULATORY_CARE_PROVIDER_SITE_OTHER): Payer: 59 | Admitting: Physician Assistant

## 2011-12-13 ENCOUNTER — Other Ambulatory Visit (HOSPITAL_COMMUNITY): Payer: Self-pay | Admitting: *Deleted

## 2011-12-13 DIAGNOSIS — F331 Major depressive disorder, recurrent, moderate: Secondary | ICD-10-CM

## 2011-12-13 DIAGNOSIS — F32A Depression, unspecified: Secondary | ICD-10-CM

## 2011-12-13 DIAGNOSIS — F329 Major depressive disorder, single episode, unspecified: Secondary | ICD-10-CM

## 2011-12-13 DIAGNOSIS — F3289 Other specified depressive episodes: Secondary | ICD-10-CM

## 2011-12-13 MED ORDER — CITALOPRAM HYDROBROMIDE 10 MG PO TABS: 10.0000 mg | ORAL_TABLET | Freq: Every day | ORAL | Status: DC

## 2011-12-13 MED ORDER — BUPROPION HCL ER (SR) 100 MG PO TB12: 100.0000 mg | ORAL_TABLET | Freq: Two times a day (BID) | ORAL | Status: DC

## 2011-12-13 MED ORDER — TRAZODONE 25 MG HALF TABLET: 25.0000 mg | ORAL_TABLET | Freq: Every evening | ORAL | Status: DC | PRN

## 2011-12-13 MED ORDER — TRAZODONE HCL 50 MG PO TABS: 25.0000 mg | ORAL_TABLET | Freq: Every evening | ORAL | Status: DC | PRN

## 2011-12-13 NOTE — Progress Notes (Signed)
   [facility] Follow-up Outpatient Visit  KAMILE FASSLER Apr 06, 1961  Date: 12/13/11  Subjective: Junious Dresser presents today to followup on her medications for depression. She reports that overall she is doing well. Her headaches have resolved. She is sleeping better. She is concerned about taking off the 20 pounds that were drained while she was on Remeron. She had a good holiday season where she was surrounded with family. She has seen Shonna Chock once, and plans to see her again later this week. She denies any suicidal or homicidal ideation. She denies any auditory or visual hallucinations.  There were no vitals filed for this visit.  Mental Status Examination  Appearance: Well groomed and casually dressed Alert: Yes Attention: good  Cooperative: Yes Eye Contact: Good Speech: Clear and even Psychomotor Activity: Normal Memory/Concentration: Intact Oriented: person, place, time/date and situation Mood: Depressed, mildly Affect: Congruent Thought Processes and Associations: Linear Fund of Knowledge: Good Thought Content:  Insight: Good Judgement: Good  Diagnosis: Maj. depressive disorder, recurrent, moderate  Treatment Plan: Add Wellbutrin to improve, depression and limited appetite. Continue Celexa and trazodone as previously prescribed. Followup in one month  Kathyann Spaugh, PA

## 2011-12-21 ENCOUNTER — Ambulatory Visit (INDEPENDENT_AMBULATORY_CARE_PROVIDER_SITE_OTHER): Payer: 59 | Admitting: Psychology

## 2011-12-21 DIAGNOSIS — IMO0002 Reserved for concepts with insufficient information to code with codable children: Secondary | ICD-10-CM

## 2011-12-21 NOTE — Progress Notes (Signed)
   THERAPIST PROGRESS NOTE  Session Time: 0930 - 1005   Participation Level: Active  Behavioral Response: CasualAlertEuthymic  Type of Therapy: Individual Therapy  Treatment Goals addressed: Communication: assertiveness and Coping  Interventions: Solution Focused, Assertiveness Training and Reframing  Summary: [name] is a 51 y.o. female who presents with much improved mood and decreased depression and anxiety since last visit.  We discussed the things that have made a difference: finalization of separation agreement, financial management via her bank to consolidate bills, and being assertive with daughter.. Affect is broad, no tearfulness, logical and goal oriented thought processes, and verbalizes increased sense of calm and ease.  We discussed the recent medication changes and identified a question for Jorje Guild regarding the prescription.  Medication teaching regarding side effects and expectations of the medication.  Discussed sexual dysfunctions possible with SSRI that may be improved by addition of Wellbutrin.  She states she has noticed no difference in her mood or sleep since the addition of Wellbutrin.   Reviewed how she is using assertiveness to her advantage and I clarified some of the ways in which she might adapt her adage of "taking care of myself" to include making reasoned choice about doing for others vs. Self.  She seems to have a good sense of understanding the priciple of looking out for herself without being overly selfish.  She continues to agree that working only one job is the best for her and family.  Talked about improved relationship with daughter when parents agreed about some limits and communicated these to her.  Suicidal/Homicidal: Nowithout intent/plan  Therapist Response: See above  Plan: Return again in 4  Weeks. We will continue to focus on assetiveness, setting boundaries and weighing alternatives as she develops greater self-awareness.  Plan to  move toward less frequent sessions, but with open invitation to schedule visits as she sees the need.  Diagnosis: Axis I: Major Depression, Recurrent severe, in early remission    Axis II: Deferred    [facility], RN 12/21/2011

## 2012-01-08 ENCOUNTER — Ambulatory Visit (HOSPITAL_COMMUNITY): Payer: 59 | Admitting: Physician Assistant

## 2012-01-08 ENCOUNTER — Telehealth (HOSPITAL_COMMUNITY): Payer: Self-pay | Admitting: Physician Assistant

## 2012-01-08 NOTE — Telephone Encounter (Signed)
Returned pt's call and left message to call back.  We have tried to reach each other at least twice now, and are playing "phone tag."

## 2012-01-23 ENCOUNTER — Ambulatory Visit (HOSPITAL_COMMUNITY): Payer: 59 | Admitting: Psychology

## 2012-01-29 ENCOUNTER — Ambulatory Visit (INDEPENDENT_AMBULATORY_CARE_PROVIDER_SITE_OTHER): Payer: 59 | Admitting: Physician Assistant

## 2012-01-29 DIAGNOSIS — F331 Major depressive disorder, recurrent, moderate: Secondary | ICD-10-CM

## 2012-01-29 MED ORDER — CITALOPRAM HYDROBROMIDE 20 MG PO TABS: 20.0000 mg | ORAL_TABLET | Freq: Every day | ORAL | Status: DC

## 2012-01-29 MED ORDER — BUPROPION HCL ER (XL) 150 MG PO TB24: 150.0000 mg | ORAL_TABLET | Freq: Every day | ORAL | Status: DC

## 2012-02-13 NOTE — Progress Notes (Signed)
   [facility] Follow-up Outpatient Visit  MEEYA GOLDIN 10-15-1961  Date: 01/29/12   Subjective: [name] presents today to followup on her medications prescribed for her depressive disorder.she feels that the Wellbutrin prescribed at her last visit has had a significant impact on her depression. She reports that her energy has increased, she feels decreased depressed mood, she has an increase in interest, and her concentration has improved. She does feel that when she wakes in the morning and it is difficult for her to get moving.Her sleep and appetite are good. She denies any suicidal or homicidal ideation. She denies any auditory visual hallucinations.  There were no vitals filed for this visit.  Mental Status Examination  Appearance: Well groomed and casually dressed Alert: Yes Attention: good  Cooperative: Yes Eye Contact: Good Speech: Clear and even Psychomotor Activity: Normal Memory/Concentration: Intact Oriented: person, place, time/date and situation Mood: Euthymic Affect: Appropriate Thought Processes and Associations: Linear Fund of Knowledge: Good Thought Content:  Insight: Good Judgement: Good  Diagnosis: Maj. Depressive disorder, recurrent, moderate  Treatment Plan: We will change her Wellbutrin to XL 150 mg daily. We will continue her Celexa at 20 mg at bedtime and trazodone 50 mg at bedtime. She will return for followup in 3 months  Maxamus Colao, PA

## 2012-04-30 ENCOUNTER — Ambulatory Visit (INDEPENDENT_AMBULATORY_CARE_PROVIDER_SITE_OTHER): Payer: 59 | Admitting: Physician Assistant

## 2012-04-30 DIAGNOSIS — F331 Major depressive disorder, recurrent, moderate: Secondary | ICD-10-CM

## 2012-04-30 MED ORDER — DULOXETINE HCL 30 MG PO CPEP: ORAL_CAPSULE | ORAL | Status: DC

## 2012-04-30 NOTE — Progress Notes (Signed)
   [facility] Follow-up Outpatient Visit  [name] 10-06-1961  Date: 04/30/2012   Subjective: Junious Dresser presents today to followup on medications prescribed for depression. She reports that she stopped taking the Wellbutrin because she felt it was causing her to want to sleep all the time. She continues to take the Celexa at 20 mg daily. She complains that she is experiencing increased depression. She takes the trazodone only when necessary, and that is once a week most weeks. She wonders if she should try to resume the Wellbutrin, or possibly change to another antidepressant medication. She denies any suicidal or homicidal ideation. She denies any auditory or visual hallucinations.  There were no vitals filed for this visit.  Mental Status Examination  Appearance: Well groomed and casually dressed Alert: Yes Attention: good  Cooperative: Yes Eye Contact: Good Speech: Clear, even, and coherent Psychomotor Activity: Normal Memory/Concentration: Intact Oriented: person, place, time/date and situation Mood: Depressed Affect: Constricted Thought Processes and Associations: Linear Fund of Knowledge: Good Thought Content: Normal Insight: Fair Judgement: Fair  Diagnosis: Maj. depressive disorder recurrent moderate  Treatment Plan: We will cross taper her from Celexa to Cymbalta, and she will return for followup at my next available appointment in approximately 6 weeks. She is encouraged to call if necessary, and if she is tolerating the Cymbalta when this prescription runs out, we will continue it at 60 mg daily.  Abreanna Drawdy, PA-C

## 2012-06-11 ENCOUNTER — Other Ambulatory Visit (HOSPITAL_COMMUNITY): Payer: Self-pay | Admitting: Physician Assistant

## 2012-06-11 MED ORDER — DULOXETINE HCL 60 MG PO CPEP: 60.0000 mg | ORAL_CAPSULE | Freq: Every day | ORAL | Status: DC

## 2012-06-18 ENCOUNTER — Ambulatory Visit (HOSPITAL_COMMUNITY): Payer: Self-pay | Admitting: Physician Assistant

## 2012-07-05 ENCOUNTER — Other Ambulatory Visit (HOSPITAL_COMMUNITY): Payer: Self-pay | Admitting: Physician Assistant

## 2012-07-05 MED ORDER — DULOXETINE HCL 60 MG PO CPEP: 60.0000 mg | ORAL_CAPSULE | Freq: Every day | ORAL | Status: DC

## 2012-07-08 ENCOUNTER — Other Ambulatory Visit: Payer: Self-pay | Admitting: Physician Assistant

## 2012-07-17 ENCOUNTER — Ambulatory Visit (INDEPENDENT_AMBULATORY_CARE_PROVIDER_SITE_OTHER): Payer: 59 | Admitting: Medical

## 2012-07-17 ENCOUNTER — Encounter: Payer: Self-pay | Admitting: Medical

## 2012-07-17 VITALS — BP 140/90 | HR 60 | Temp 98.2°F | Resp 16 | Wt 178.0 lb

## 2012-07-17 DIAGNOSIS — R51 Headache: Secondary | ICD-10-CM

## 2012-07-17 DIAGNOSIS — J329 Chronic sinusitis, unspecified: Secondary | ICD-10-CM

## 2012-07-17 MED ORDER — TRAMADOL HCL 50 MG PO TABS: 50.0000 mg | ORAL_TABLET | Freq: Three times a day (TID) | ORAL | Status: AC | PRN

## 2012-07-17 MED ORDER — LEVOFLOXACIN 500 MG PO TABS: 500.0000 mg | ORAL_TABLET | Freq: Every day | ORAL | Status: AC

## 2012-07-17 NOTE — Progress Notes (Signed)
Subjective:   HPI  [name] is a 51 y.o. female who presents for headache and possible infection.  She notes remote hx/o chronic headaches, has seen headache specialist in the past, had normal head scans.    She is here for about a week hx/o headache behind right eye, ear discomfort, coughing some, eyes have been watery and crusty in the mornings, and headache pain has been both behind eye as well as across the forehead.  She is using sudafed and ibuprofen.   She notes some right sided teeth pain, pain in head worse with bending forward.  She denies runny nose, sneezing, sore throat, productive sputum, no numbness, tingling, weakness, vision or hearing changes.  Denies chest pain, SOB, no recent diet changes, no recent changes in caffeine use.  No new medications started recently.  No recent ETOH use.  No rash.  No sick contacts with similar.  No recent stress changes.  She works third shift, and in the past migraines were attributed to shift change, but she has had no recent issues with work, sleeping ok.  No other aggravating or relieving factors.    No other c/o.  The following portions of the patient's history were reviewed and updated as appropriate: allergies, current medications, past family history, past medical history, past social history, past surgical history and problem list.  Past Medical History  Diagnosis Date  . Chronic kidney disease   . Hypertension   . Hemorrhoids     Allergies  Allergen Reactions  . Penicillins     REACTION: hives     Review of Systems ROS reviewed and was negative other than noted in HPI or above.    Objective:   Physical Exam  General appearance: alert, no distress, WD/WN HEENT: normocephalic, sclerae anicteric, TMs pearly, nares with erythematous and swollen turbinates, R>L, clear discharge, pharynx normal Oral cavity: MMM, no lesions Neck: supple, no lymphadenopathy, no thyromegaly, no masses Heart: RRR, normal S1, S2, no  murmurs Lungs: CTA bilaterally, no wheezes, rhonchi, or rales Pulses: 2+ symmetric, upper and lower extremities, normal cap refill Neuro: CN2-12 intact, normal DTRs, nonfocal exam   Assessment and Plan :     Encounter Diagnoses  Name Primary?  . Headache Yes  . Sinusitis    I suspect sinus infection as the source of her headache.  Begin Levaquin, hydrate well, rest, Ultram for pain.  If not resolved or better in 5-7 days, recheck.   Return sooner prn.

## 2012-09-23 ENCOUNTER — Ambulatory Visit (INDEPENDENT_AMBULATORY_CARE_PROVIDER_SITE_OTHER): Payer: 59 | Admitting: Physician Assistant

## 2012-09-23 DIAGNOSIS — F331 Major depressive disorder, recurrent, moderate: Secondary | ICD-10-CM

## 2012-09-23 MED ORDER — TRAZODONE HCL 50 MG PO TABS: 25.0000 mg | ORAL_TABLET | Freq: Every evening | ORAL | Status: DC | PRN

## 2012-09-23 NOTE — Progress Notes (Signed)
   [facility] Follow-up Outpatient Visit  LISANDRA MATHISEN 02/10/1961  Date: 09/23/2012   Subjective: [name] presents today to followup on her treatment for depression. She reports that she developed migraine headaches, and her primary care physician increased her Cymbalta to 120 mg in hopes that it would treat the migraines. She reports that he has been helpful and she denies any side effects to that dose of Cymbalta. She reports that she has been going through several stressful things in her life recently. She and her husband of 21 years have separated. Her daughter wants to live with her sister when she comes back for the holidays. [name] reports her mood has been up and down, and that she has seen an increase in anxiety, with 2 panic attacks that last only seconds. She reports that her sleep is okay, and she takes the trazodone only on occasion. Her appetite is good. She has noticed she is isolating more. She denies any suicidal or homicidal ideation. She denies any auditory or visual hallucinations.  There were no vitals filed for this visit.  Mental Status Examination  Appearance: Casual Alert: Yes Attention: good  Cooperative: Yes Eye Contact: Good Speech: Clear and coherent Psychomotor Activity: Normal Memory/Concentration: Intact Oriented: person, place, time/date and situation Mood: Depressed Affect: Blunt Thought Processes and Associations: Linear Fund of Knowledge: Good Thought Content: Normal Insight: Good Judgement: Good  Diagnosis: Maj. depressive disorder, recurrent, moderate  Treatment Plan: We will continue her Cymbalta at 120 mg daily, and trazodone 25 mg at bedtime for sleep. She will return for followup in 3 months. We'll refer her for therapy with Geanie Berlin.  Shakea Isip, PA-C

## 2012-10-07 ENCOUNTER — Ambulatory Visit (INDEPENDENT_AMBULATORY_CARE_PROVIDER_SITE_OTHER): Payer: 59 | Admitting: Licensed Clinical Social Worker

## 2012-10-07 ENCOUNTER — Ambulatory Visit (HOSPITAL_COMMUNITY): Payer: Self-pay | Admitting: Licensed Clinical Social Worker

## 2012-10-07 ENCOUNTER — Encounter (HOSPITAL_COMMUNITY): Payer: Self-pay | Admitting: Licensed Clinical Social Worker

## 2012-10-07 DIAGNOSIS — F331 Major depressive disorder, recurrent, moderate: Secondary | ICD-10-CM

## 2012-10-07 DIAGNOSIS — F329 Major depressive disorder, single episode, unspecified: Secondary | ICD-10-CM

## 2012-10-07 NOTE — Progress Notes (Signed)
Patient ID: LESLE FARON, female   DOB: June 09, 1961, 51 y.o.   MRN: [id number] Patient:   [name]   DOB:   Jun 04, 1961  MR Number:  [id number]  Location:  [facility] OUTPATIENT THERAPY [facility] [id number] [place] [address] Dept: [phone]           Date of Service:   10/07/2012 Start Time:   9:30am End Time:   10:20am  Provider/Observer:  Geanie Berlin LCSW       Billing Code/Service: [phone]  Chief Complaint:     Chief Complaint  Patient presents with  . Anxiety  . Depression    sleep broken up (4-5 hours), appetite increased   . Stress  . Panic Attack    Reason for Service:  Patient is referred by Jorje Guild, PA for the treatment of depression and anxiety.   Current Status:  Patient reports a history of depression over the past year, with increased symptoms last month (October) of daily sadness, anxiety, poor motivation, anhedonia and isolation. She reports feeling better over the past few weeks, but is tearful in session while describing her current stressors. She is getting a divorce and currently involved with a man who is still married. She still lives with her soon to be ex-husband and reports that they get alone better now than they have in the past. She is not distraught about getting a divorce, but is concerned about where she will live after the holidays. She feels rejected by her youngest daughter after she learned of the divorce and is isolated. She denies any AH, VH or paranoia. She did attempt suicide one year ago, but denies any current SI or HI thoughts, intent or plan.   Reliability of Information: good  Behavioral Observation: [name]  presents as a 51 y.o.-year-old  Caucasian Female who appeared her stated age. her dress was Appropriate and she was Well Groomed and her manners were Appropriate to the situation.  There were not any physical disabilities noted.  she displayed  an appropriate level of cooperation and motivation.    Interactions:    Active   Attention:   within normal limits  Memory:   within normal limits  Visuo-spatial:   within normal limits  Speech (Volume):  normal  Speech:   normal pitch and normal volume  Thought Process:  Coherent and Relevant  Though Content:  WNL  Orientation:   person, place and time/date  Judgment:   Good  Planning:   Good  Affect:    Anxious  Mood:    Anxious and Depressed  Insight:   Good  Intelligence:   normal  Marital Status/Living: Married twice. Going through second divorce. Two children. Currently living with soon to be ex-husband.   Current Employment: [employer]. Works third shift making cigarettes. Enjoys her job and enjoys working third shift.   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   Substance Use:  No concerns of substance abuse are reported.    Education:   HS Graduate  Medical History:   Past Medical History  Diagnosis Date  . Chronic kidney disease   . Hypertension   . Hemorrhoids   . Depression   . Anxiety         Outpatient Encounter Prescriptions as of 10/07/2012  Medication Sig Dispense Refill  . DULoxetine (CYMBALTA) 60 MG capsule Take 1 capsule (60 mg total) by mouth daily.  30 capsule  1  . estradiol (ESTRACE) 2 MG tablet Take 2 mg by mouth daily.        . traZODone (DESYREL) 50 MG tablet Take 0.5 tablets (25 mg total) by mouth at bedtime as needed. Take 1/2 to one tablet at bedtime as needed for sleep.  30 tablet  2          Sexual History:   History  Sexual Activity  . Sexually Active: Yes  . Birth Control/ Protection: Post-menopausal    Abuse/Trauma History: none  Psychiatric History:  One hospitalization after suicide attempt one year ago. Prior treatment with Shonna Chock.   Family Med/Psych History:  Family History  Problem Relation Age of Onset  . Cancer Father     Risk of Suicide/Violence: low Attempted suicide October  2012  Impression/DX:  MDD severe, recurrent without psychotic features.   Disposition/Plan:  Bi-weekly treatment to address depression and anxiety, to improve coping strategies and life balance.    Diagnosis:    Axis I: MDD        Axis II: No diagnosis       Axis III:  Kidney disease      Axis IV:  problems related to social environment and problems with primary support group          Axis V:  51-60 moderate symptoms

## 2012-10-28 ENCOUNTER — Encounter (HOSPITAL_COMMUNITY): Payer: Self-pay | Admitting: Licensed Clinical Social Worker

## 2012-10-28 ENCOUNTER — Ambulatory Visit (INDEPENDENT_AMBULATORY_CARE_PROVIDER_SITE_OTHER): Payer: 59 | Admitting: Licensed Clinical Social Worker

## 2012-10-28 DIAGNOSIS — F331 Major depressive disorder, recurrent, moderate: Secondary | ICD-10-CM

## 2012-10-28 NOTE — Progress Notes (Signed)
   THERAPIST PROGRESS NOTE  Session Time: 8:30am-9:20am  Participation Level: Active  Behavioral Response: Well GroomedAlertAnxious and Euthymic  Type of Therapy: Individual Therapy  Treatment Goals addressed: Coping  Interventions: CBT, Motivational Interviewing, Solution Focused, Strength-based, Supportive and Reframing  Summary: [name] is a 51 y.o. female who presents with euthymic mood and anxious affect. She reports improvement in her depression and anxiety and had an enjoyable holiday. She is pleased that her and her daughter are getting along better and feels she is not being judged as she was before. She will remain in the home with her ex-husband until after the Christmas holiday and will stay there after he has left in order to get the home ready for sale. She is excited to learn that she will be able to purchase a home of her own once she gets her credit fixed. Her sleep is poor as she was told to discontinue Trazadone by her neurologist. Her appetite is wnl.    Suicidal/Homicidal: Nowithout intent/plan  Therapist Response: Assessed patients current functioning and reviewed progress. Reviewed coping strategies. Assessed patients safety and assisted in identifying protective factors.  Reviewed crisis plan with patient. Assisted patient with the expression of her feelings of frustration and anxiety about the end of her marriage. Patients thinking is less distorted and aligned realistically regarding the ending of her marriage. Processed and normalized her grief and loss and guilt associated with her end of her marriage. Used motivational interviewing to assist and encourage patient through the change process. Explored patients barriers to change. Reviewed patients self care plan. Assessed  progress related to self care. Patient's self care is good. Recommend proper diet, regular exercise, socialization and recreation. Discussed plans for the holidays and ways patient can manage  her stress and improve self hygiene.   Plan: Return again in three to four weeks.  Diagnosis: Axis I: Major Depression, Recurrent severe    Axis II: No diagnosis    Kilan Banfill, LCSW 10/28/2012

## 2012-11-05 ENCOUNTER — Telehealth (HOSPITAL_COMMUNITY): Payer: Self-pay

## 2012-11-05 ENCOUNTER — Telehealth (HOSPITAL_COMMUNITY): Payer: Self-pay | Admitting: Physician Assistant

## 2012-11-05 NOTE — Telephone Encounter (Signed)
Have made several calls and left messages over the past 3 days regarding patient's request for completion of FMLA paperwork. We'll continue to attempt to reach the patient.

## 2012-11-13 ENCOUNTER — Ambulatory Visit (INDEPENDENT_AMBULATORY_CARE_PROVIDER_SITE_OTHER): Payer: 59 | Admitting: Licensed Clinical Social Worker

## 2012-11-13 DIAGNOSIS — F331 Major depressive disorder, recurrent, moderate: Secondary | ICD-10-CM

## 2012-11-13 DIAGNOSIS — F329 Major depressive disorder, single episode, unspecified: Secondary | ICD-10-CM

## 2012-11-13 NOTE — Addendum Note (Signed)
Addended by: Remus Loffler on: 11/13/2012 01:01 PM   Modules accepted: Level of Service

## 2012-11-13 NOTE — Progress Notes (Addendum)
   THERAPIST PROGRESS NOTE  Session Time: 8:30am-9:00am  Participation Level: Active  Behavioral Response: Well GroomedAlertAnxious and Euthymic  Type of Therapy: Individual Therapy  Treatment Goals addressed: Coping  Interventions: CBT, Motivational Interviewing, Strength-based, Supportive and Reframing  Summary: [name] is a 51 y.o. female who presents with euthymic mood and anxious affect. She reports doing well, but does endorse anxiety related to the upcoming Christmas holiday. She is happy that her daughter is home visiting and that her daughter is communicating with her. She realizes that her daughter does not blame her for the end of the marriage and that her decision to move out with her sister is a function of her not wanting to take sides. Patient does not feel rejected by her daughter any more. She is tearful when reflecting upon this being the last Christmas in the house all together as a family. She does question the decision to end the marriage on occasion, but is then reminded that she has made the right choice when her husband becomes angry with her. She is eager to live on her own. She is not motivated to exercise, but is trying to walk when she can. Her sleep and appetite are wnl.    Suicidal/Homicidal: Nowithout intent/plan  Therapist Response: Assessed patients current functioning and reviewed progress. Reviewed coping strategies. Assessed patients safety and assisted in identifying protective factors.  Reviewed crisis plan with patient. Assisted patient with the expression of her feelings of sadness over the end of her marriage. Processed and normalized her grief reaction regarding the end of her marriage. Explored any feelings of doubt she may have. Used CBT to assist patient with the identification of negative distortions and irrational thoughts. Encouraged patient to verbalize alternative and factual responses which challenge thought distortions. Used DBT to  practice mindfulness, review distraction list and improve distress tolerance skills. Discussed strategies to manage stress on Christmas day. Used motivational interviewing to assist and encourage patient through the change process. Explored patients barriers to change. Reviewed patients self care plan. Assessed  progress related to self care. Patient's self care is improving. Recommend proper diet, regular exercise, socialization and recreation.   Plan: Return again in three weeks.  Diagnosis: Axis I: Depressive Disorder NOS    Axis II: No diagnosis    Rakiya Krawczyk, LCSW 11/13/2012

## 2012-12-03 ENCOUNTER — Ambulatory Visit (INDEPENDENT_AMBULATORY_CARE_PROVIDER_SITE_OTHER): Payer: 59 | Admitting: Physician Assistant

## 2012-12-03 DIAGNOSIS — F331 Major depressive disorder, recurrent, moderate: Secondary | ICD-10-CM

## 2012-12-03 MED ORDER — DULOXETINE HCL 60 MG PO CPEP: 120.0000 mg | ORAL_CAPSULE | Freq: Every day | ORAL | Status: DC

## 2012-12-03 NOTE — Progress Notes (Signed)
   [facility] Follow-up Outpatient Visit  [name] 02-Aug-1961  Date: 12/03/2012   Subjective: Terissa presents today to followup on her treatment for depression. She reports that her mood has been stable. She feels that her visits with Geanie Berlin are very helpful. She has stopped taking trazodone as Dr. Dorena Bodo suspected it may have been causing her headaches. She is now taking Topamax 50 mg twice daily for migraines. She states that he she is sleeping well without the trazodone, and her appetite is okay. She reports that she has had one panic attack since she was last seen in this office in October. She reports that she is isolating less. She denies any suicidal or homicidal ideation. She denies any auditory or visual hallucinations.  There were no vitals filed for this visit.  Mental Status Examination  Appearance: Casual Alert: Yes Attention: good  Cooperative: Yes Eye Contact: Good Speech: Clear and coherent Psychomotor Activity: Normal Memory/Concentration: Intact Oriented: person, place, time/date and situation Mood: Dysphoric Affect: Congruent Thought Processes and Associations: Linear Fund of Knowledge: Good Thought Content: Normal Insight: Fair Judgement: Good  Diagnosis: Maj. depressive disorder, recurrent, moderate  Treatment Plan: We will continue her Cymbalta at 120 mg daily. We will discontinue the trazodone. We will complete her FMLA paperwork so that she can stay out of work as much as 5 days per month as is necessary. She will return for followup in 3 months, and she will continue to see Geanie Berlin for psychotherapy.  Shaylyn Bawa, PA-C

## 2012-12-06 ENCOUNTER — Ambulatory Visit (HOSPITAL_COMMUNITY): Payer: Self-pay | Admitting: Licensed Clinical Social Worker

## 2012-12-13 ENCOUNTER — Encounter: Payer: Self-pay | Admitting: Family Medicine

## 2012-12-13 ENCOUNTER — Ambulatory Visit (INDEPENDENT_AMBULATORY_CARE_PROVIDER_SITE_OTHER): Payer: 59 | Admitting: Family Medicine

## 2012-12-13 VITALS — BP 150/100 | Temp 98.3°F | Wt 173.0 lb

## 2012-12-13 DIAGNOSIS — J0100 Acute maxillary sinusitis, unspecified: Secondary | ICD-10-CM

## 2012-12-13 MED ORDER — CLARITHROMYCIN 500 MG PO TABS: 500.0000 mg | ORAL_TABLET | Freq: Two times a day (BID) | ORAL | Status: DC

## 2012-12-13 NOTE — Patient Instructions (Signed)
Use Advil or Aleve for the aches and pains. Take all the antibiotic and if not totally back to normal give me a call

## 2012-12-13 NOTE — Progress Notes (Signed)
  Subjective:    Patient ID: [name], female    DOB: 1961/09/17, 52 y.o.   MRN: [id number]  HPI On Wednesday she noted left earache followed by left facial pain, left-sided sore throat on the PND, chills, cough and congestion. She does not smoke and has no allergies. She has had previous difficulty with pneumonia and has seen pulmonary in the past. She also complains of left upper tooth pain.   Review of Systems     Objective:   Physical Exam alert and in no distress. Tympanic membranes and canals are normal. Throat is clear. Tonsils are normal. Neck is supple without adenopathy or thyromegaly. Cardiac exam shows a regular sinus rhythm without murmurs or gallops. Lungs are clear to auscultation. Nasal mucosa is red. She does have tenderness to palpation over the left maxillary sinuses.        Assessment & Plan:   1. Acute maxillary sinusitis  clarithromycin (BIAXIN) 500 MG tablet   encouraged her to use anti-inflammatory of choice and call me if not totally back to normal when she finishes the antibiotic.

## 2012-12-18 ENCOUNTER — Encounter (HOSPITAL_COMMUNITY): Payer: Self-pay | Admitting: Emergency Medicine

## 2012-12-18 ENCOUNTER — Emergency Department (HOSPITAL_COMMUNITY)
Admission: EM | Admit: 2012-12-18 | Discharge: 2012-12-18 | Disposition: A | Discharge status: Home / Self Care | Payer: 59 | Attending: Emergency Medicine | Admitting: Emergency Medicine

## 2012-12-18 DIAGNOSIS — Z8719 Personal history of other diseases of the digestive system: Secondary | ICD-10-CM | POA: Insufficient documentation

## 2012-12-18 DIAGNOSIS — Z79899 Other long term (current) drug therapy: Secondary | ICD-10-CM | POA: Insufficient documentation

## 2012-12-18 DIAGNOSIS — Z8659 Personal history of other mental and behavioral disorders: Secondary | ICD-10-CM | POA: Insufficient documentation

## 2012-12-18 DIAGNOSIS — N12 Tubulo-interstitial nephritis, not specified as acute or chronic: Secondary | ICD-10-CM

## 2012-12-18 DIAGNOSIS — Z87448 Personal history of other diseases of urinary system: Secondary | ICD-10-CM | POA: Insufficient documentation

## 2012-12-18 DIAGNOSIS — I10 Essential (primary) hypertension: Secondary | ICD-10-CM | POA: Insufficient documentation

## 2012-12-18 DIAGNOSIS — Z87442 Personal history of urinary calculi: Secondary | ICD-10-CM | POA: Insufficient documentation

## 2012-12-18 DIAGNOSIS — N189 Chronic kidney disease, unspecified: Secondary | ICD-10-CM | POA: Insufficient documentation

## 2012-12-18 LAB — URINALYSIS, ROUTINE W REFLEX MICROSCOPIC
Glucose, UA: NEGATIVE mg/dL
Protein, ur: NEGATIVE mg/dL
pH: 7 (ref 5.0–8.0)

## 2012-12-18 LAB — URINE MICROSCOPIC-ADD ON

## 2012-12-18 LAB — CBC WITH DIFFERENTIAL/PLATELET
Eosinophils Relative: 1 % (ref 0–5)
HCT: 39.1 % (ref 36.0–46.0)
Lymphocytes Relative: 8 % — ABNORMAL LOW (ref 12–46)
Lymphs Abs: 1 10*3/uL (ref 0.7–4.0)
MCV: 87.7 fL (ref 78.0–100.0)
Monocytes Absolute: 0.5 10*3/uL (ref 0.1–1.0)
RDW: 12.5 % (ref 11.5–15.5)
WBC: 11.9 10*3/uL — ABNORMAL HIGH (ref 4.0–10.5)

## 2012-12-18 LAB — BASIC METABOLIC PANEL
BUN: 16 mg/dL (ref 6–23)
CO2: 26 mEq/L (ref 19–32)
Chloride: 101 mEq/L (ref 96–112)
Creatinine, Ser: 1.18 mg/dL — ABNORMAL HIGH (ref 0.50–1.10)

## 2012-12-18 MED ORDER — ONDANSETRON HCL 4 MG/2ML IJ SOLN
4.0000 mg | Freq: Once | INTRAMUSCULAR | Status: AC
  Administered 2012-12-18: 4 mg via INTRAVENOUS
  Filled 2012-12-18: qty 2

## 2012-12-18 MED ORDER — SODIUM CHLORIDE 0.9 % IV BOLUS (SEPSIS)
500.0000 mL | Freq: Once | INTRAVENOUS | Status: AC
  Administered 2012-12-18: 500 mL via INTRAVENOUS

## 2012-12-18 MED ORDER — DEXTROSE 5 % IV SOLN
1.0000 g | Freq: Once | INTRAVENOUS | Status: AC
  Administered 2012-12-18: 1 g via INTRAVENOUS
  Filled 2012-12-18: qty 10

## 2012-12-18 MED ORDER — HYDROMORPHONE HCL PF 1 MG/ML IJ SOLN
1.0000 mg | Freq: Once | INTRAMUSCULAR | Status: AC
  Administered 2012-12-18: 1 mg via INTRAVENOUS
  Filled 2012-12-18: qty 1

## 2012-12-18 MED ORDER — OXYCODONE-ACETAMINOPHEN 5-325 MG PO TABS
2.0000 | ORAL_TABLET | Freq: Once | ORAL | Status: AC
  Administered 2012-12-18: 2 via ORAL
  Filled 2012-12-18: qty 2

## 2012-12-18 MED ORDER — CEPHALEXIN 500 MG PO CAPS: 500.0000 mg | ORAL_CAPSULE | Freq: Four times a day (QID) | ORAL | Status: DC

## 2012-12-18 MED ORDER — OXYCODONE-ACETAMINOPHEN 5-325 MG PO TABS: 2.0000 | ORAL_TABLET | ORAL | Status: DC | PRN

## 2012-12-18 MED ORDER — ONDANSETRON 8 MG PO TBDP
8.0000 mg | ORAL_TABLET | Freq: Once | ORAL | Status: AC
  Administered 2012-12-18: 8 mg via ORAL
  Filled 2012-12-18: qty 1

## 2012-12-18 NOTE — ED Provider Notes (Signed)
History     CSN: [id number]  Arrival date & time 12/18/12  1859   First MD Initiated Contact with Patient 12/18/12 1955      Chief Complaint  Patient presents with  . Flank Pain     Patient is a 52 y.o. female presenting with flank pain. The history is provided by the patient.  Flank Pain This is a new problem. Episode onset: earlier today. The problem occurs constantly. The problem has been gradually worsening. Associated symptoms include abdominal pain. Pertinent negatives include no chest pain and no shortness of breath. Exacerbated by: palpation. Nothing relieves the symptoms. She has tried rest for the symptoms. The treatment provided no relief.   Pt reports abrupt onset of right flank pain with nausea/vomiting No cp/sob.  No diarrhea No lower abdominal pain No focal weakness is reported No urinary symptoms reported She reports this is similar to prior episodes of kidney stones She reports h/o "kidney tumor" in the past but has also had stones as well. Past Medical History  Diagnosis Date  . Chronic kidney disease   . Hypertension   . Hemorrhoids   . Depression   . Anxiety     Past Surgical History  Procedure Date  . Tumor excision from both kidneys  . Cesarean section   . Choleycystectomy   . Abdominal hysterectomy     Family History  Problem Relation Age of Onset  . Cancer Father     History  Substance Use Topics  . Smoking status: Never Smoker   . Smokeless tobacco: Never Used  . Alcohol Use: No    OB History    Grav Para Term Preterm Abortions TAB SAB Ect Mult Living                  Review of Systems  Constitutional: Negative for fever.  Respiratory: Negative for shortness of breath.   Cardiovascular: Negative for chest pain.  Gastrointestinal: Positive for abdominal pain.  Genitourinary: Positive for flank pain.  Neurological: Negative for weakness.  Psychiatric/Behavioral: Negative for agitation.  All other systems reviewed and are  negative.    Allergies  Penicillins  Home Medications   Current Outpatient Rx  Name  Route  Sig  Dispense  Refill  . DULOXETINE HCL 60 MG PO CPEP   Oral   Take 120 mg by mouth 2 (two) times daily.         Marland Kitchen ESTRADIOL 2 MG PO TABS   Oral   Take 2 mg by mouth daily.           . TOPIRAMATE 50 MG PO TABS   Oral   Take 50 mg by mouth 2 (two) times daily.         Marland Kitchen TRIMETHOPRIM 100 MG PO TABS   Oral   Take 100 mg by mouth daily.           BP 149/92  Pulse 75  Temp 97.4 F (36.3 C) (Oral)  Resp 20  Ht 5\' 5"  (1.651 m)  Wt 167 lb (75.751 kg)  BMI 27.79 kg/m2  SpO2 97%  Physical Exam CONSTITUTIONAL: Well developed/well nourished, uncomfortable appearing HEAD AND FACE: Normocephalic/atraumatic EYES: EOMI/PERRL, no icterus ENMT: Mucous membranes moist NECK: supple no meningeal signs SPINE:entire spine nontender CV: S1/S2 noted, no murmurs/rubs/gallops noted LUNGS: Lungs are clear to auscultation bilaterally, no apparent distress ABDOMEN: soft, nontender, no rebound or guarding UV:OZDGU cva tenderness NEURO: Pt is awake/alert, moves all extremitiesx4 EXTREMITIES: pulses normal, full ROM SKIN:  warm, color normal PSYCH: no abnormalities of mood noted  ED Course  Procedures  Labs Reviewed  URINALYSIS, ROUTINE W REFLEX MICROSCOPIC - Abnormal; Notable for the following:    APPearance CLOUDY (*)     Hgb urine dipstick SMALL (*)     All other components within normal limits  URINE MICROSCOPIC-ADD ON - Abnormal; Notable for the following:    Bacteria, UA MANY (*)     All other components within normal limits  BASIC METABOLIC PANEL  CBC WITH DIFFERENTIAL  URINE CULTURE   8:51 PM Pt with reported h/o kidney stones (last Ct imaging from 2008 just showed renal scarring, no mass/stones) Will treat pain and reassess 10:06 PM Pt improved.  No vomiting.  Abdomen is soft.  She reports similar to prior kidney stones, however minimal blood in urine.  Bacteria also  noted in urine.  Early pyelo is possible.  She is not septic appearing/afebrile and nontoxic.  I offered her to perform CT imaging given h/o renal scarring, but since she is improving she would like to defer imaging.  She will start rocephin/keflex (reports she has taken amox/keflex before without problem) and will f/u with her urologist this week.  She takes trimethoprim daily for prophylaxis, urine culture pending.    MDM  Nursing notes including past medical history and social history reviewed and considered in documentation Labs/vital reviewed and considered Previous records reviewed and considered - previous imaging results reviewed         Joya Gaskins, MD 12/18/12 2208

## 2012-12-18 NOTE — ED Notes (Signed)
Patient complaining of right flank pain radiating into right lower abdomen since 1300 today. States "I have had kidney stones and this feels like it."

## 2012-12-20 ENCOUNTER — Other Ambulatory Visit: Payer: Self-pay | Admitting: Urology

## 2012-12-20 LAB — URINE CULTURE

## 2012-12-20 NOTE — Progress Notes (Signed)
Pt instructed to arrive 0800 on 12/23/12 for ESWL at 1000. No aspirin or ibuprofen products to be taken after this conversation. Reviewed need for bowel prep on 12/22/12 between 3-6 PM. Pt to have insurance card, responsible driver, and blue folder from [facility] with her on 12/23/12. She knows she has to have responsible adult with her after procedure.  She verbalizes understanding.

## 2012-12-23 ENCOUNTER — Encounter (HOSPITAL_COMMUNITY): Payer: Self-pay | Admitting: *Deleted

## 2012-12-23 ENCOUNTER — Ambulatory Visit (HOSPITAL_COMMUNITY): Payer: 59

## 2012-12-23 ENCOUNTER — Encounter (HOSPITAL_COMMUNITY)
Admission: RE | Disposition: A | Discharge status: Home / Self Care | Payer: Self-pay | Source: Ambulatory Visit | Attending: Urology

## 2012-12-23 ENCOUNTER — Ambulatory Visit (HOSPITAL_COMMUNITY)
Admission: RE | Admit: 2012-12-23 | Discharge: 2012-12-23 | Disposition: A | Discharge status: Home / Self Care | Payer: 59 | Source: Ambulatory Visit | Attending: Urology | Admitting: Urology

## 2012-12-23 DIAGNOSIS — Z79899 Other long term (current) drug therapy: Secondary | ICD-10-CM | POA: Insufficient documentation

## 2012-12-23 DIAGNOSIS — N189 Chronic kidney disease, unspecified: Secondary | ICD-10-CM | POA: Insufficient documentation

## 2012-12-23 DIAGNOSIS — N201 Calculus of ureter: Secondary | ICD-10-CM

## 2012-12-23 DIAGNOSIS — I129 Hypertensive chronic kidney disease with stage 1 through stage 4 chronic kidney disease, or unspecified chronic kidney disease: Secondary | ICD-10-CM | POA: Insufficient documentation

## 2012-12-23 SURGERY — LITHOTRIPSY, ESWL
Anesthesia: LOCAL | Laterality: Right

## 2012-12-23 MED ORDER — DIAZEPAM 5 MG PO TABS
10.0000 mg | ORAL_TABLET | ORAL | Status: AC
  Administered 2012-12-23: 10 mg via ORAL
  Filled 2012-12-23: qty 2

## 2012-12-23 MED ORDER — TAMSULOSIN HCL 0.4 MG PO CAPS: 0.4000 mg | ORAL_CAPSULE | Freq: Every day | ORAL | Status: DC

## 2012-12-23 MED ORDER — LEVOFLOXACIN 500 MG PO TABS
500.0000 mg | ORAL_TABLET | ORAL | Status: AC
  Administered 2012-12-23: 500 mg via ORAL
  Filled 2012-12-23: qty 1

## 2012-12-23 MED ORDER — DEXTROSE-NACL 5-0.45 % IV SOLN
INTRAVENOUS | Status: DC
  Administered 2012-12-23: 09:00:00 via INTRAVENOUS

## 2012-12-23 MED ORDER — DIPHENHYDRAMINE HCL 25 MG PO CAPS
25.0000 mg | ORAL_CAPSULE | ORAL | Status: AC
  Administered 2012-12-23: 25 mg via ORAL
  Filled 2012-12-23: qty 1

## 2012-12-23 NOTE — H&P (Signed)
H&P  Chief Complaint: Kidney stone  History of Present Illness: [name] is a 52 y.o. year old woman who presents for ESL. She presented to my office 3 days ago w/ a few day history of right flank pain. SHe was found to have a moderate sized right upper/ mid ureteral stone. Because of size and location, she was scheduled for treatment.  Past Medical History  Diagnosis Date  . Chronic kidney disease   . Hypertension   . Hemorrhoids   . Depression   . Anxiety     Past Surgical History  Procedure Date  . Tumor excision from both kidneys  . Cesarean section   . Choleycystectomy   . Abdominal hysterectomy     Home Medications:  Medications Prior to Admission  Medication Sig Dispense Refill  . cephALEXin (KEFLEX) 500 MG capsule Take 1 capsule (500 mg total) by mouth 4 (four) times daily.  40 capsule  0  . DULoxetine (CYMBALTA) 60 MG capsule Take 120 mg by mouth 2 (two) times daily.      Marland Kitchen estradiol (ESTRACE) 2 MG tablet Take 2 mg by mouth daily.        Marland Kitchen oxyCODONE-acetaminophen (PERCOCET/ROXICET) 5-325 MG per tablet Take 2 tablets by mouth every 4 (four) hours as needed for pain.  15 tablet  0  . topiramate (TOPAMAX) 50 MG tablet Take 50 mg by mouth 2 (two) times daily.      Marland Kitchen trimethoprim (TRIMPEX) 100 MG tablet Take 100 mg by mouth daily.        Allergies:  Allergies  Allergen Reactions  . Penicillins Hives    Family History  Problem Relation Age of Onset  . Cancer Father     Social History:  reports that she has never smoked. She has never used smokeless tobacco. She reports that she does not drink alcohol or use illicit drugs.  ROS: A complete review of systems was performed.  All systems are negative except for pertinent findings as noted.  Physical Exam:  Vital signs in last 24 hours: Temp:  [98.7 F (37.1 C)] 98.7 F (37.1 C) (01/27 0747) Pulse Rate:  [96] 96  (01/27 0747) Resp:  [20] 20  (01/27 0747) BP: (159)/(92) 159/92 mmHg (01/27 0747) SpO2:   [98 %] 98 % (01/27 0747) Weight:  [78.245 kg (172 lb 8 oz)] 78.245 kg (172 lb 8 oz) (01/27 0747) General:  Alert and oriented, No acute distress HEENT: Normocephalic, atraumatic Neck: No JVD or lymphadenopathy Cardiovascular: Regular rate and rhythm Lungs: Clear bilaterally Abdomen: Soft, nontender, nondistended, no abdominal masses Back: No CVA tenderness Extremities: No edema Neurologic: Grossly intact  Laboratory Data:  No results found for this or any previous visit (from the past 24 hour(s)). Recent Results (from the past 240 hour(s))  URINE CULTURE     Status: Normal   Collection Time   12/18/12  8:00 PM      Component Value Range Status Comment   Specimen Description URINE, CLEAN CATCH   Final    Special Requests NONE   Final    Culture  Setup Time 12/19/2012 16:36   Final    Colony Count 20,OOO COLONIES/ML   Final    Culture     Final    Value: Multiple bacterial morphotypes present, none predominant. Suggest appropriate recollection if clinically indicated.   Report Status 12/20/2012 FINAL   Final    Creatinine:  Basename 12/18/12 2028  CREATININE 1.18*    Radiologic Imaging: Dg Abd  1 View  12/23/2012  *RADIOLOGY REPORT*  Clinical Data: Preoperative evaluation for kidney stone extraction.  ABDOMEN - 1 VIEW  Comparison: Abdominal CT 08/14/2007.  Findings: Small calcifications overlie the renal shadows bilaterally.  Lateral to the right L3-L4 disc space is a right- sided calcification measuring 11 mm which could reflect a mid ureteral calculus.  Bilateral pelvic calcifications are grossly unchanged.  Cholecystectomy clips are noted.  IMPRESSION: Suspected bilateral nephrolithiasis with possible mid right ureteral calculus.  Correlation with recent imaging recommended.   Original Report Authenticated By: Carey Bullocks, M.D.     Impression/Assessment:  Moderate size right ureteral stone  Plan:  Extracorporeal shockwave lithotripsy of right ureteral stone  Jonalyn Sedlak,  Braylynn Ghan M 12/23/2012, 8:50 AM  Bertram Millard. Samari Gorby MD  Cipriano Bunker Ardith Dark

## 2012-12-23 NOTE — Discharge Instructions (Signed)
See [facility] discharge instructions in chart.  

## 2012-12-24 HISTORY — PX: EXTRACORPOREAL SHOCK WAVE LITHOTRIPSY: SHX1557

## 2012-12-27 ENCOUNTER — Ambulatory Visit (INDEPENDENT_AMBULATORY_CARE_PROVIDER_SITE_OTHER): Payer: 59 | Admitting: Licensed Clinical Social Worker

## 2012-12-27 DIAGNOSIS — F3289 Other specified depressive episodes: Secondary | ICD-10-CM

## 2012-12-27 DIAGNOSIS — F329 Major depressive disorder, single episode, unspecified: Secondary | ICD-10-CM

## 2012-12-27 NOTE — Progress Notes (Signed)
   THERAPIST PROGRESS NOTE  Session Time: 9:30am-10:20am  Participation Level: Active  Behavioral Response: Well GroomedAlertAnxious and Depressed  Type of Therapy: Individual Therapy  Treatment Goals addressed: Coping  Interventions: CBT, Strength-based, Supportive, Reframing and Other: grief and loss  Summary: [name] is a 52 y.o. female who presents with depressed mood and tearful affect. Her husband has moved out and she is adjusting to living alone for the first time in her life. Thus far, she likes it. She is tearful about her dating relationship and endorses frustration that her boyfriend is still with his wife. She wants him to make a choice, but is fearful to tell him this. She does not want to be blamed for him leaving if he becomes unhappy or they don't work out. She believes that he is the biggest contributor to her depression and suicide attempt. She feels left out and left behind, when she is not with him, but when she sees him at work or spends time with him, she feels better. She is staying active with friends. She denies any suicidal ideation, intent or plan. Her sleep and appetite are wnl.    Suicidal/Homicidal: Nowithout intent/plan  Therapist Response: Assessed patients current functioning and reviewed progress. Reviewed coping strategies. Assessed patients safety and assisted in identifying protective factors.  Reviewed crisis plan with patient. Assisted patient with the expression of her feelings of frustration . Used CBT to assist patient with the identification of negative distortions and irrational thoughts. Encouraged patient to verbalize alternative and factual responses which challenge thought distortions. Discussed healthy boundaries and assertive communication. Reviewed patients self care plan. Assessed  progress related to self care. Patient's self care is good. Recommend proper diet, regular exercise, socialization and recreation.   Plan: Return again in  two to three weeks.  Diagnosis: Axis I: Depressive Disorder NOS    Axis II: No diagnosis    Dominick Morella, LCSW 12/27/2012

## 2013-01-10 ENCOUNTER — Ambulatory Visit (HOSPITAL_COMMUNITY): Payer: Self-pay | Admitting: Licensed Clinical Social Worker

## 2013-03-04 ENCOUNTER — Ambulatory Visit (INDEPENDENT_AMBULATORY_CARE_PROVIDER_SITE_OTHER): Payer: 59 | Admitting: Physician Assistant

## 2013-03-04 DIAGNOSIS — F330 Major depressive disorder, recurrent, mild: Secondary | ICD-10-CM

## 2013-03-04 MED ORDER — DULOXETINE HCL 60 MG PO CPEP: 60.0000 mg | ORAL_CAPSULE | Freq: Two times a day (BID) | ORAL | Status: DC

## 2013-03-04 NOTE — Progress Notes (Signed)
   [facility] Follow-up Outpatient Visit  [name] Jan 09, 1961  Date: 03/04/2013   Subjective: [name] presents today to followup on her treatment for depression. She reports that she is doing well. We are is a tough time for her so she is glad to see spring time coming around. She is spending her time working in the garden, and going to watch her daughter play softball at college. She endorses good sleep and appetite. She denies any suicidal or homicidal ideation. She denies any auditory or visual hallucinations. She feels like her energy is pretty good.  There were no vitals filed for this visit.  Mental Status Examination  Appearance: Casual Alert: Yes Attention: good  Cooperative: Yes Eye Contact: Good Speech: Clear and coherent Psychomotor Activity: Normal Memory/Concentration: Intact Oriented: person, place, time/date and situation Mood: Dysphoric Affect: Congruent Thought Processes and Associations: Linear Fund of Knowledge: Good Thought Content: Normal Insight: Good Judgement: Good  Diagnosis: Maj. depressive disorder, recurrent, mild  Treatment Plan: We will continue her Cymbalta 60 mg twice daily, and she will return for followup in 4 months. If she remains stable, we will stretch her appointments out to every 6 months.  Chanin Frumkin, PA-C

## 2013-03-24 ENCOUNTER — Ambulatory Visit (HOSPITAL_COMMUNITY): Payer: Self-pay | Admitting: Licensed Clinical Social Worker

## 2013-04-03 ENCOUNTER — Other Ambulatory Visit: Payer: Self-pay | Admitting: Obstetrics and Gynecology

## 2013-07-08 ENCOUNTER — Ambulatory Visit (INDEPENDENT_AMBULATORY_CARE_PROVIDER_SITE_OTHER): Payer: 59 | Admitting: Physician Assistant

## 2013-07-08 ENCOUNTER — Encounter (HOSPITAL_COMMUNITY): Payer: Self-pay | Admitting: Physician Assistant

## 2013-07-08 VITALS — BP 120/72 | HR 64 | Resp 12 | Ht 65.0 in | Wt 173.2 lb

## 2013-07-08 DIAGNOSIS — F331 Major depressive disorder, recurrent, moderate: Secondary | ICD-10-CM

## 2013-07-08 MED ORDER — DULOXETINE HCL 60 MG PO CPEP: 60.0000 mg | ORAL_CAPSULE | Freq: Every day | ORAL | Status: DC

## 2013-07-08 NOTE — Progress Notes (Signed)
[facility] [id number] Progress Note  HONEST SAFRANEK [id number] 52 y.o.  07/08/2013 2:50 PM  Chief Complaint: Increased symptoms of depression  History of Present Illness: Adelene presents today to followup on her treatment for depression. She reports that she weaned herself off of Cymbalta, and after being off of it for 2 or 3 weeks now, she has noticed that she is more sensitive, irritable, moody, anxious, and lacks patience. She also reports that her sleep is not as good as it was. She denies any suicidal or homicidal ideation. She denies any auditory or visual hallucinations. She is willing to resume her medication or try something else.  Suicidal Ideation: No Plan Formed: No Patient has means to carry out plan: No  Homicidal Ideation: No Plan Formed: No Patient has means to carry out plan: No  Review of Systems: Psychiatric: Agitation: Yes Hallucination: No Depressed Mood: Yes Insomnia: Yes Hypersomnia: No Altered Concentration: No Feels Worthless: No Grandiose Ideas: No Belief In Special Powers: No New/Increased Substance Abuse: No Compulsions: No  Neurologic: Headache: No Seizure: No Paresthesias: No  Past Medical History: Hypertension, chronic kidney disease, hemorrhoids  Outpatient Encounter Prescriptions as of 07/08/2013  Medication Sig Dispense Refill  . cephALEXin (KEFLEX) 500 MG capsule Take 1 capsule (500 mg total) by mouth 4 (four) times daily.  40 capsule  0  . DULoxetine (CYMBALTA) 60 MG capsule Take 1 capsule (60 mg total) by mouth daily.  30 capsule  1  . estradiol (ESTRACE) 2 MG tablet Take 2 mg by mouth daily.        Marland Kitchen oxyCODONE-acetaminophen (PERCOCET/ROXICET) 5-325 MG per tablet Take 2 tablets by mouth every 4 (four) hours as needed for pain.  15 tablet  0  . Tamsulosin HCl (FLOMAX) 0.4 MG CAPS Take 1 capsule (0.4 mg total) by mouth daily after supper.  30 capsule    . topiramate (TOPAMAX) 50 MG tablet Take 50 mg by mouth 2 (two) times  daily.      Marland Kitchen trimethoprim (TRIMPEX) 100 MG tablet Take 100 mg by mouth daily.      . [DISCONTINUED] DULoxetine (CYMBALTA) 60 MG capsule Take 1 capsule (60 mg total) by mouth 2 (two) times daily.  180 capsule  1   No facility-administered encounter medications on file as of 07/08/2013.    Past Psychiatric History/Hospitalization(s): Anxiety: No Bipolar Disorder: No Depression: Yes Mania: No Psychosis: No Schizophrenia: No Personality Disorder: No Hospitalization for psychiatric illness: No History of Electroconvulsive Shock Therapy: No Prior Suicide Attempts: No  Physical Exam: Constitutional:  BP 120/72  Pulse 64  Resp 12  Ht 5\' 5"  (1.651 m)  Wt 173 lb 3.2 oz (78.563 kg)  BMI 28.82 kg/m2  General Appearance: alert, oriented, no acute distress, well nourished and well groomed and casually dressed  Musculoskeletal: Strength & Muscle Tone: within normal limits Gait & Station: normal Patient leans: N/A  Psychiatric: Speech (describe rate, volume, coherence, spontaneity, and abnormalities if any): Clear and coherent at a radial rate and rhythm and normal volume  Thought Process (describe rate, content, abstract reasoning, and computation): Within normal limits  Associations: Intact  Thoughts: normal  Mental Status: Orientation: oriented to person, place, time/date and situation Mood & Affect: Depressed mood with tearful affect Attention Span & Concentration: Intact  Medical Decision Making (Choose Three): Review of Psycho-Social Stressors (1), Established Problem, Worsening (2) and Review of New Medication or Change in Dosage (2)  Assessment: Axis I: Maj. depressive disorder recurrent, moderate  Axis II: Deferred  Axis III: Hypertension, chronic kidney disease, hemorrhoids  Axis IV: Moderate  Axis V: 50   Plan: We will resume his Cymbalta at 60 mg daily, and she will return for followup in 2 months. She is encouraged to call between appointments if she feels  that the dose is not appropriate or if she is experiencing side effects.  Tijah Hane, PA-C 07/08/2013

## 2013-09-09 ENCOUNTER — Ambulatory Visit (HOSPITAL_COMMUNITY): Payer: Self-pay | Admitting: Physician Assistant

## 2013-09-25 ENCOUNTER — Encounter (HOSPITAL_COMMUNITY): Payer: Self-pay | Admitting: Psychiatry

## 2013-09-25 ENCOUNTER — Encounter (INDEPENDENT_AMBULATORY_CARE_PROVIDER_SITE_OTHER): Payer: Self-pay

## 2013-09-25 ENCOUNTER — Ambulatory Visit (INDEPENDENT_AMBULATORY_CARE_PROVIDER_SITE_OTHER): Payer: 59 | Admitting: Psychiatry

## 2013-09-25 VITALS — BP 143/88 | HR 60 | Ht 65.0 in | Wt 176.6 lb

## 2013-09-25 DIAGNOSIS — F339 Major depressive disorder, recurrent, unspecified: Secondary | ICD-10-CM

## 2013-09-25 DIAGNOSIS — F330 Major depressive disorder, recurrent, mild: Secondary | ICD-10-CM

## 2013-09-25 MED ORDER — DULOXETINE HCL 60 MG PO CPEP: 60.0000 mg | ORAL_CAPSULE | Freq: Every day | ORAL | Status: DC

## 2013-09-25 NOTE — Progress Notes (Signed)
[facility] [id number] Progress Note  [name] [id number] 52 y.o.  09/25/2013 11:11 AM  Chief Complaint:  I have a long history of depression.  History of Present Illness: Patient is a 52 year old Caucasian employed divorced female who came for her followup appointment.  She's been seen in this office since November 2012.  She started her psychiatric care in this office upon discharge from inpatient psychiatric services.  She was admitted at [facility] after taking an overdose on 8 tablets and cut her wrist.  At that time she was very depressed having issues with the husband, job and financial issues.  In the past she has taken very high dose of Cymbalta however earlier this year she requested to be weaned off bombs Cymbalta.  She stopping Cymbalta however after a few weeks she started to feel depressed again.  She became very sensitive irritable and having depressive thoughts.  She was restarted Cymbalta 60 mg.  Patient today came in requesting that she is ready to come off from Cymbalta.  However she also admitted October is always a difficult month for her.  She endorse fall is always bring her more anxiety and depressive thoughts.  She admitted to some irritability and poor sleep but denies any suicidal thoughts or homicidal thoughts.  She is not interested in seeing a therapist.  She denies any suicidal thoughts.  She admitted decreased energy and sometimes crying spells but there were no hallucination or paranoia.  She has not been abusing in a little substance.  She denies any homicidal thoughts.  After some discussion she agreed to continue Cymbalta 60 mg for the next 2 months.  The patient at this time does not have any side effects including any tremors shakes.  She is now taking trazodone.    Suicidal Ideation: No Plan Formed: No Patient has means to carry out plan: No  Homicidal Ideation: No Plan Formed: No Patient has means to carry out plan: No  Review of  Systems: Psychiatric: Agitation: No Hallucination: No Depressed Mood: Yes Insomnia: Yes Hypersomnia: No Altered Concentration: No Feels Worthless: No Grandiose Ideas: No Belief In Special Powers: No New/Increased Substance Abuse: No Compulsions: No  Neurologic: Headache: History of migraine headaches Seizure: No Paresthesias: No  Past Medical Family, Social History: Patient lives by herself.  She has 2 children.  Both live out of town.  She has supportive family around.  She is divorced.  She is working as a second shift at [employer].  Patient has history of chronic renal issues and a stone.  She is taking antibiotic for that.  Outpatient Encounter Prescriptions as of 09/25/2013  Medication Sig Dispense Refill  . DULoxetine (CYMBALTA) 60 MG capsule Take 1 capsule (60 mg total) by mouth daily.  30 capsule  1  . estradiol (ESTRACE) 2 MG tablet Take 2 mg by mouth daily.        Marland Kitchen trimethoprim (TRIMPEX) 100 MG tablet Take 100 mg by mouth daily.      . [DISCONTINUED] DULoxetine (CYMBALTA) 60 MG capsule Take 1 capsule (60 mg total) by mouth daily.  30 capsule  1  . [DISCONTINUED] cephALEXin (KEFLEX) 500 MG capsule Take 1 capsule (500 mg total) by mouth 4 (four) times daily.  40 capsule  0  . [DISCONTINUED] oxyCODONE-acetaminophen (PERCOCET/ROXICET) 5-325 MG per tablet Take 2 tablets by mouth every 4 (four) hours as needed for pain.  15 tablet  0  . [DISCONTINUED] Tamsulosin HCl (FLOMAX) 0.4 MG CAPS Take 1 capsule (  0.4 mg total) by mouth daily after supper.  30 capsule    . [DISCONTINUED] topiramate (TOPAMAX) 50 MG tablet Take 50 mg by mouth 2 (two) times daily.       No facility-administered encounter medications on file as of 09/25/2013.    Past Psychiatric History/Hospitalization(s): Patient has one psychiatric hospitalization in 2012 after taking overdose on her medication and cutting her wrist.  At that time she was very depressed because of financial issues, marital stress and  disagreement with her daughter.  She had tried Wellbutrin which made her more sleepy, Celexa which made her irritable and Remeron which did not help her.  She had tried Cymbalta 120 mg in the past and then she decided to stop medication because she was doing better but resulting in increased worsening of the depression.  She denies any history of psychosis, mania or any hallucination. Anxiety: Yes Bipolar Disorder: No Depression: Yes Mania: No Psychosis: No Schizophrenia: No Personality Disorder: No Hospitalization for psychiatric illness: The patient was admitted at [facility] in 2012 after taking overdose on her medication and cutting her wrists. History of Electroconvulsive Shock Therapy: No Prior Suicide Attempts: Yes  Physical Exam: Constitutional:  BP 143/88  Pulse 60  Ht 5\' 5"  (1.651 m)  Wt 176 lb 9.6 oz (80.105 kg)  BMI 29.39 kg/m2  General Appearance: well nourished  Musculoskeletal: Strength & Muscle Tone: within normal limits Gait & Station: normal Patient leans: N/A  Psychiatric: Speech (describe rate, volume, coherence, spontaneity, and abnormalities if any): Soft clear and coherent.  Normal tone and volume.  Thought Process (describe rate, content, abstract reasoning, and computation): Logical and goal directed.  Associations: Relevant and Intact  Thoughts: normal and rummination  Mental Status: Orientation: oriented to person, place, time/date, situation and day of week Mood & Affect: depressed affect and anxiety Attention Span & Concentration: Good  Medical Decision Making (Choose Three): Established Problem, Stable/Improving (1), Review of Psycho-Social Stressors (1), Review and summation of old records (2), Review of Last Therapy Session (1), Review of Medication Regimen & Side Effects (2) and Review of New Medication or Change in Dosage (2)  Assessment: Axis I: Maj. depressive disorder, recurrent  Axis II: Deferred  Axis III:  Past Medical  History  Diagnosis Date  . Chronic kidney disease   . Hypertension   . Hemorrhoids   . Depression   . Anxiety     Axis IV: Moderate  Axis V: 65-70   Plan: I review her symptoms, history, or records and previous medication.  Patient wants to cut down her Cymbalta but actually she is very concerned about her fall season.  She had history of more depressed in the winter season.  I recommend to stay on Cymbalta 60 mg another 2-3 months since it is helping her.  Patient is not interested in counseling at this time.  She has a good support system.  She is not taking trazodone because she feels very sedated in the morning.  Patient is not having side effects of Cymbalta.  In the past she used to take Cymbalta 120 mg.  Will continue Cymbalta 60 mg at this time.  Recommend to call us back if she has any question or concern.  Followup in 2 monthsTime spent 25 minutes.  More than 50% of the time spent in psychoeducation, counseling and coordination of care.  Discuss safety plan that anytime having active suicidal thoughts or homicidal thoughts then patient need to call 911 or go to the local  emergency room.    Tranquilino Fischler T., MD 09/25/2013

## 2013-11-25 ENCOUNTER — Ambulatory Visit (HOSPITAL_COMMUNITY): Payer: Self-pay | Admitting: Psychiatry

## 2014-02-25 ENCOUNTER — Ambulatory Visit (INDEPENDENT_AMBULATORY_CARE_PROVIDER_SITE_OTHER): Payer: 59 | Admitting: Family Medicine

## 2014-02-25 ENCOUNTER — Encounter: Payer: Self-pay | Admitting: Family Medicine

## 2014-02-25 VITALS — BP 130/86 | HR 60 | Temp 97.7°F | Ht 66.0 in | Wt 184.0 lb

## 2014-02-25 DIAGNOSIS — B9711 Coxsackievirus as the cause of diseases classified elsewhere: Secondary | ICD-10-CM

## 2014-02-25 DIAGNOSIS — B338 Other specified viral diseases: Secondary | ICD-10-CM

## 2014-02-25 DIAGNOSIS — B341 Enterovirus infection, unspecified: Secondary | ICD-10-CM

## 2014-02-25 DIAGNOSIS — J019 Acute sinusitis, unspecified: Secondary | ICD-10-CM

## 2014-02-25 MED ORDER — AZITHROMYCIN 250 MG PO TABS: ORAL_TABLET | ORAL | Status: DC

## 2014-02-25 NOTE — Progress Notes (Signed)
Chief Complaint  Patient presents with  . Cough    and wheezing when she walks. Severe HA with right sided facial and teeth pain. Mucus/drainage started this am. Low grade fever this past weekend.    Started about a week ago with mainly sore throat.  The last couple of days she has developed right-sided facial pain, and pain into her upper teeth.  She has had bloody drainage from the right side of her nose, but no drainage until this morning.  Today has had postnasal drainage, just a little.  Her throat continues to feel very sore.  Over the last week, she has been wheezing when she is trying to walk.  She has no h/o allergies, or h/o asthma.  She recalls needing inhaler with illnesses in the past.   She saw Dr. Halford Chessman in the past, after having had pneumonia a few times.  She was told "they didn't find anything"  She has had some fever up to 102 two nights ago.  She has been using Tylenol Sinus, Tylenol throat and cold, Benadryl, and ibuprofen.  Other than keeping fever down, meds haven't improved symptoms very much. No known sick contacts.  Past Medical History  Diagnosis Date  . Chronic kidney disease   . Hypertension   . Hemorrhoids   . Depression   . Anxiety    Past Surgical History  Procedure Laterality Date  . Tumor excision  from both kidneys  . Cesarean section    . Choleycystectomy    . Abdominal hysterectomy     History   Social History  . Marital Status: Married    Spouse Name: N/A    Number of Children: N/A  . Years of Education: N/A   Occupational History  . Not on file.   Social History Main Topics  . Smoking status: Never Smoker   . Smokeless tobacco: Never Used  . Alcohol Use: No  . Drug Use: No  . Sexual Activity: Yes    Birth Control/ Protection: Post-menopausal   Other Topics Concern  . Not on file   Social History Narrative  . No narrative on file    Outpatient Encounter Prescriptions as of 02/25/2014  Medication Sig Note  . diphenhydrAMINE  (BENADRYL) 25 MG tablet Take 25 mg by mouth every 6 (six) hours as needed.   Marland Kitchen estradiol (ESTRACE) 2 MG tablet Take 2 mg by mouth daily.     Marland Kitchen ibuprofen (ADVIL,MOTRIN) 200 MG tablet Take 400 mg by mouth every 6 (six) hours as needed.   . pseudoephedrine-acetaminophen (TYLENOL SINUS) 30-500 MG TABS Take 1 tablet by mouth every 4 (four) hours as needed.   Marland Kitchen TROKENDI XR 100 MG CP24 Take 1 tablet by mouth daily.  02/25/2014: From Dr. Valaria Good ([facility]) for migraines (topirimate)  . VIIBRYD 40 MG TABS Take 40 mg by mouth daily.    . [DISCONTINUED] trimethoprim (TRIMPEX) 100 MG tablet Take 100 mg by mouth daily.   . [DISCONTINUED] DULoxetine (CYMBALTA) 60 MG capsule Take 1 capsule (60 mg total) by mouth daily.    Allergies  Allergen Reactions  . Penicillins Hives   ROS:  Denies vomiting or diarrhea.  Some nausea related to her headache.  No bleeding, bruising or rashes (just some bleeding from right nare).  Denies chest pain.  PHYSICAL EXAM: BP 130/86  Pulse 60  Temp(Src) 97.7 F (36.5 C) (Oral)  Ht '5\' 6"'  (1.676 m)  Wt 184 lb (83.462 kg)  BMI 29.71 kg/m2  Well developed  female, in mild discomfort due to headache. Not coughing or sniffling HEENT:  PERRL, EOMI, conjunctiva clear. TM's and EAc's normal.  Nasal mucosa is moderately edematous, with some erythema.  White mucus on right.  She is diffusely tender on the right side of her head--over the sinuses, but also in front of ear, and at temple.  OP shows moist mucus membranes.  There is an erythematous ulcer on the left side, in the anterior tonsillar pillar area.  No other ulcers or exudates are noted Neck: no significant lymphadenopathy, no thyromegaly or mass Heart: regular rate and rhythm, no murmur Lungs: clear bilaterally.  No wheezes, rales or ronchi. No wheeze with forced expiration Neuro: alert and oriented.  Cranial nerves intact.  Normal strength, gait Skin: no rash Psych: normal mood, affect  ASSESSMENT/PLAN:  Acute  sinusitis - Plan: azithromycin (ZITHROMAX) 250 MG tablet  Coxsackie viral disease   Febrile illness with oropharyngeal ulcer suggests coxsackie virus.  Likely has sinus congestion, and is at risk for developing infection.  Drink plenty of fluids.  Continue tylenol and/or ibuprofen as needed for pain and/or fever Try sinus rinses (sinus rinse kit or Neti-pot) mucinex (guaifenesin--an expectorant to loosen mucus/phlegm) might also help. You can use the plain kind, or switch to the DM version if you develop a significant cough (dextromethorphan--cough suppressant)  Start the z-pak in a few days if your fever and sinus pain persist/worsen rather than improve, as would be expected from the viral illness

## 2014-02-25 NOTE — Patient Instructions (Signed)
  Drink plenty of fluids.  Continue tylenol and/or ibuprofen as needed for pain and/or fever Try sinus rinses (sinus rinse kit or Neti-pot) mucinex (guaifenesin--an expectorant to loosen mucus/phlegm) might also help. You can use the plain kind, or switch to the DM version if you develop a significant cough (dextromethorphan--cough suppressant)  Start the z-pak in a few days if your fever and sinus pain persist/worsen rather than improve, as would be expected from the viral illness  Herpangina  Herpangina is a viral illness that causes sores inside the mouth and throat. It can be passed from person to person (contagious). Most cases of herpangina occur in the summer. CAUSES  Herpangina is caused by a virus. This virus can be spread by saliva and mouth-to-mouth contact. It can also be spread through contact with an infected person's stools. It usually takes 3 to 6 days after exposure to show signs of infection. SYMPTOMS   Fever.  Very sore, red throat.  Small blisters in the back of the throat.  Sores inside the mouth, lips, cheeks, and in the throat.  Blisters around the outside of the mouth.  Painful blisters on the palms of the hands and soles of the feet.  Irritability.  Poor appetite.  Dehydration. DIAGNOSIS  This diagnosis is made by a physical exam. Lab tests are usually not required. TREATMENT  This illness normally goes away on its own within 1 week. Medicines may be given to ease your symptoms. HOME CARE INSTRUCTIONS   Avoid salty, spicy, or acidic food and drinks. These foods may make your sores more painful.  If the patient is a baby or young child, weigh your child daily to check for dehydration. Rapid weight loss indicates there is not enough fluid intake. Consult your caregiver immediately.  Ask your caregiver for specific rehydration instructions.  Only take over-the-counter or prescription medicines for pain, discomfort, or fever as directed by your  caregiver. SEEK IMMEDIATE MEDICAL CARE IF:   Your pain is not relieved with medicine.  You have signs of dehydration, such as dry lips and mouth, dizziness, dark urine, confusion, or a rapid pulse. MAKE SURE YOU:  Understand these instructions.  Will watch your condition.  Will get help right away if you are not doing well or get worse. Document Released: 08/12/2003 Document Revised: 02/05/2012 Document Reviewed: 06/05/2011 [facility] Patient Information 2014 [place], [place].

## 2014-04-30 ENCOUNTER — Ambulatory Visit (INDEPENDENT_AMBULATORY_CARE_PROVIDER_SITE_OTHER): Payer: 59 | Admitting: Family Medicine

## 2014-04-30 ENCOUNTER — Encounter: Payer: Self-pay | Admitting: Family Medicine

## 2014-04-30 VITALS — BP 120/70 | Wt 175.0 lb

## 2014-04-30 DIAGNOSIS — N390 Urinary tract infection, site not specified: Secondary | ICD-10-CM

## 2014-04-30 DIAGNOSIS — R300 Dysuria: Secondary | ICD-10-CM

## 2014-04-30 LAB — POCT URINALYSIS DIPSTICK
Bilirubin, UA: NEGATIVE
Glucose, UA: NEGATIVE
KETONES UA: NEGATIVE
NITRITE UA: POSITIVE
PH UA: 6
Spec Grav, UA: 1.015
Urobilinogen, UA: NEGATIVE

## 2014-04-30 MED ORDER — SULFAMETHOXAZOLE-TMP DS 800-160 MG PO TABS: 1.0000 | ORAL_TABLET | Freq: Two times a day (BID) | ORAL | Status: DC

## 2014-04-30 NOTE — Progress Notes (Signed)
   Subjective:    Patient ID: [name], female    DOB: January 17, 1961, 53 y.o.   MRN: [id number]  HPI She has a several day history of urinary urgency, frequency, dysuria. No fever, chills. She has a remote history of UTI but none recently.   Review of Systems     Objective:   Physical Exam Alert and in no distress. Urine dipstick was positive.      Assessment & Plan:  Burning with urination - Plan: Urinalysis Dipstick  UTI (lower urinary tract infection) - Plan: sulfamethoxazole-trimethoprim (BACTRIM DS) 800-160 MG per tablet  I will treat her through the weekend and if she still having symptoms, she will call me for a full ten-day course. She is comfortable with that.

## 2014-04-30 NOTE — Patient Instructions (Signed)
Azo-Standard to help with the urgency and frequent

## 2014-05-22 ENCOUNTER — Other Ambulatory Visit: Payer: Self-pay | Admitting: Urology

## 2014-06-01 ENCOUNTER — Encounter (HOSPITAL_BASED_OUTPATIENT_CLINIC_OR_DEPARTMENT_OTHER): Payer: Self-pay | Admitting: *Deleted

## 2014-06-02 ENCOUNTER — Encounter (HOSPITAL_BASED_OUTPATIENT_CLINIC_OR_DEPARTMENT_OTHER): Payer: Self-pay | Admitting: *Deleted

## 2014-06-03 ENCOUNTER — Encounter (HOSPITAL_BASED_OUTPATIENT_CLINIC_OR_DEPARTMENT_OTHER): Payer: Self-pay | Admitting: *Deleted

## 2014-06-03 NOTE — Progress Notes (Signed)
NPO AFTER MN. ARRIVE AT 0600. NEEDS ISTAT 8. WILL TAKE AM MEDS AND IF NEEDED TYLENOL W/ SIPS OF WATER DOS.

## 2014-06-07 NOTE — H&P (Signed)
  H&P  Chief Complaint: Kidney stone  History of Present Illness: [name] is a 53 y.o. year old female who presents for treatment of a right sided kidney stone and hematuria. She presented in June of this year with gross hematuria. She was found to have a right renal pelvic stone, pyuria and urothelial abnormalities of the right ureter. She was placed on antibiotics, and presents for cystoscopy, right retrograde ureteropyelogram, right ureteroscopy and laser lithotripsy of her stone.   Past Medical History  Diagnosis Date  . Hemorrhoids   . Depression   . Anxiety   . Right kidney stone   . IBS (irritable bowel syndrome)   . History of pancreatitis     2001  . History of colon polyps     TUBULAR ADENOMA  . History of kidney stones   . Medullary sponge kidney   . Migraine   . Mild intermittent asthma     NO INHALER  . Pleural fibrosis     MILD SUBPLEURAL FIBROSIS-- LAST CT 2011--  PER PULMOLOGIST NOTE DR SOOD 2011    Past Surgical History  Procedure Laterality Date  . Cesarean section  X2  . Extracorporeal shock wave lithotripsy Right 12-24-2012  . Tubal ligation  1993  . Laparoscopic cholecystectomy  [phone]  . Ercp  06/2000  . Colonoscopy w/ polypectomy  2004  &  01-16-2011  . Partial nephrectomy Bilateral LEFT  05-18-2006/   RIGHT 1998    LEFT (ANGIOMYOLIPOMA)   RIGHT (BENIGN TUMOR)  . Abdominal hysterectomy  1996    Home Medications:  No prescriptions prior to admission    Allergies:  Allergies  Allergen Reactions  . Penicillins Hives    Family History  Problem Relation Age of Onset  . Cancer Father     Social History:  reports that she has never smoked. She has never used smokeless tobacco. She reports that she does not drink alcohol or use illicit drugs.  ROS: A complete review of systems was performed.  All systems are negative except for pertinent findings as noted. Genitourinary: urinary frequency, urinary urgency, dysuria and hematuria.    Physical Exam:  Vital signs in last 24 hours:   General:  Alert and oriented, No acute distress HEENT: Normocephalic, atraumatic Neck: No JVD or lymphadenopathy Cardiovascular: Regular rate and rhythm Lungs: Clear bilaterally Abdomen: Soft, nontender, nondistended, no abdominal masses Back: No CVA tenderness Extremities: No edema Neurologic: Grossly intact  Laboratory Data:  No results found for this or any previous visit (from the past 24 hour(s)). No results found for this or any previous visit (from the past 240 hour(s)). Creatinine: No results found for this basename: CREATININE,  in the last 168 hours  Radiologic Imaging: No results found.  Impression/Assessment:  Right renal pelvic stone  Plan:  Right retrograde ureteropyelogram, right ureteroscopy, holmium laser and extraction of right renal pelvic stone  Jorja Loa 06/07/2014, 8:58 PM  Lillette Boxer. Kristof Nadeem MD

## 2014-06-08 ENCOUNTER — Encounter (HOSPITAL_BASED_OUTPATIENT_CLINIC_OR_DEPARTMENT_OTHER)
Admission: RE | Disposition: A | Discharge status: Home / Self Care | Payer: Self-pay | Source: Ambulatory Visit | Attending: Urology

## 2014-06-08 ENCOUNTER — Encounter (HOSPITAL_BASED_OUTPATIENT_CLINIC_OR_DEPARTMENT_OTHER): Payer: Self-pay | Admitting: *Deleted

## 2014-06-08 ENCOUNTER — Ambulatory Visit (HOSPITAL_BASED_OUTPATIENT_CLINIC_OR_DEPARTMENT_OTHER)
Admission: RE | Admit: 2014-06-08 | Discharge: 2014-06-08 | Disposition: A | Discharge status: Home / Self Care | Payer: 59 | Source: Ambulatory Visit | Attending: Urology | Admitting: Urology

## 2014-06-08 ENCOUNTER — Ambulatory Visit (HOSPITAL_BASED_OUTPATIENT_CLINIC_OR_DEPARTMENT_OTHER): Payer: 59 | Admitting: Anesthesiology

## 2014-06-08 ENCOUNTER — Encounter (HOSPITAL_BASED_OUTPATIENT_CLINIC_OR_DEPARTMENT_OTHER): Payer: 59 | Admitting: Anesthesiology

## 2014-06-08 DIAGNOSIS — J45909 Unspecified asthma, uncomplicated: Secondary | ICD-10-CM | POA: Insufficient documentation

## 2014-06-08 DIAGNOSIS — Z880 Allergy status to penicillin: Secondary | ICD-10-CM | POA: Insufficient documentation

## 2014-06-08 DIAGNOSIS — Z905 Acquired absence of kidney: Secondary | ICD-10-CM | POA: Insufficient documentation

## 2014-06-08 DIAGNOSIS — N201 Calculus of ureter: Secondary | ICD-10-CM

## 2014-06-08 HISTORY — DX: Personal history of colonic polyps: Z86.010

## 2014-06-08 HISTORY — DX: Fibrothorax: J94.1

## 2014-06-08 HISTORY — DX: Irritable bowel syndrome, unspecified: K58.9

## 2014-06-08 HISTORY — DX: Pleural plaque without asbestos: J92.9

## 2014-06-08 HISTORY — PX: HOLMIUM LASER APPLICATION: SHX5852

## 2014-06-08 HISTORY — DX: Personal history of other diseases of the digestive system: Z87.19

## 2014-06-08 HISTORY — DX: Personal history of colon polyps, unspecified: Z86.0100

## 2014-06-08 HISTORY — DX: Personal history of urinary calculi: Z87.442

## 2014-06-08 HISTORY — DX: Medullary cystic kidney: Q61.5

## 2014-06-08 HISTORY — DX: Mild intermittent asthma, uncomplicated: J45.20

## 2014-06-08 HISTORY — PX: CYSTOSCOPY/RETROGRADE/URETEROSCOPY/STONE EXTRACTION WITH BASKET: SHX5317

## 2014-06-08 HISTORY — DX: Migraine, unspecified, not intractable, without status migrainosus: G43.909

## 2014-06-08 LAB — POCT I-STAT, CHEM 8
BUN: 16 mg/dL (ref 6–23)
CREATININE: 0.8 mg/dL (ref 0.50–1.10)
Calcium, Ion: 1.29 mmol/L — ABNORMAL HIGH (ref 1.12–1.23)
Chloride: 109 mEq/L (ref 96–112)
Glucose, Bld: 91 mg/dL (ref 70–99)
HCT: 41 % (ref 36.0–46.0)
HEMOGLOBIN: 13.9 g/dL (ref 12.0–15.0)
Potassium: 3.2 mEq/L — ABNORMAL LOW (ref 3.7–5.3)
SODIUM: 145 meq/L (ref 137–147)
TCO2: 21 mmol/L (ref 0–100)

## 2014-06-08 SURGERY — CYSTOSCOPY, WITH CALCULUS REMOVAL USING BASKET
Anesthesia: General | Site: Ureter | Laterality: Right

## 2014-06-08 MED ORDER — FENTANYL CITRATE 0.05 MG/ML IJ SOLN
INTRAMUSCULAR | Status: AC
  Filled 2014-06-08: qty 4

## 2014-06-08 MED ORDER — HYDROCODONE-ACETAMINOPHEN 5-325 MG PO TABS
ORAL_TABLET | ORAL | Status: AC
  Filled 2014-06-08: qty 1

## 2014-06-08 MED ORDER — DEXAMETHASONE SODIUM PHOSPHATE 4 MG/ML IJ SOLN
INTRAMUSCULAR | Status: DC | PRN
  Administered 2014-06-08: 10 mg via INTRAVENOUS

## 2014-06-08 MED ORDER — OXYBUTYNIN CHLORIDE 5 MG PO TABS: 5.0000 mg | ORAL_TABLET | Freq: Three times a day (TID) | ORAL | Status: DC

## 2014-06-08 MED ORDER — FENTANYL CITRATE 0.05 MG/ML IJ SOLN
25.0000 ug | INTRAMUSCULAR | Status: DC | PRN
  Filled 2014-06-08: qty 1

## 2014-06-08 MED ORDER — BELLADONNA ALKALOIDS-OPIUM 16.2-60 MG RE SUPP
RECTAL | Status: AC
  Filled 2014-06-08: qty 1

## 2014-06-08 MED ORDER — BELLADONNA ALKALOIDS-OPIUM 16.2-60 MG RE SUPP
RECTAL | Status: DC | PRN
  Administered 2014-06-08: 1 via RECTAL

## 2014-06-08 MED ORDER — GENTAMICIN SULFATE 40 MG/ML IJ SOLN
400.0000 mg | Freq: Once | INTRAVENOUS | Status: AC
  Administered 2014-06-08: 400 mg via INTRAVENOUS
  Filled 2014-06-08: qty 10

## 2014-06-08 MED ORDER — FENTANYL CITRATE 0.05 MG/ML IJ SOLN
INTRAMUSCULAR | Status: DC | PRN
Start: 2014-06-08 — End: 2014-06-08
  Administered 2014-06-08 (×2): 25 ug via INTRAVENOUS
  Administered 2014-06-08: 50 ug via INTRAVENOUS

## 2014-06-08 MED ORDER — LIDOCAINE HCL (CARDIAC) 20 MG/ML IV SOLN
INTRAVENOUS | Status: DC | PRN
  Administered 2014-06-08: 50 mg via INTRAVENOUS

## 2014-06-08 MED ORDER — KETOROLAC TROMETHAMINE 30 MG/ML IJ SOLN
INTRAMUSCULAR | Status: DC | PRN
  Administered 2014-06-08: 30 mg via INTRAVENOUS

## 2014-06-08 MED ORDER — NITROFURANTOIN MONOHYD MACRO 100 MG PO CAPS: 100.0000 mg | ORAL_CAPSULE | Freq: Two times a day (BID) | ORAL | Status: DC

## 2014-06-08 MED ORDER — PROPOFOL 10 MG/ML IV BOLUS
INTRAVENOUS | Status: DC | PRN
  Administered 2014-06-08: 150 mg via INTRAVENOUS

## 2014-06-08 MED ORDER — LACTATED RINGERS IV SOLN
INTRAVENOUS | Status: DC
  Administered 2014-06-08: 11:00:00 via INTRAVENOUS
  Filled 2014-06-08: qty 1000

## 2014-06-08 MED ORDER — ONDANSETRON HCL 4 MG/2ML IJ SOLN
INTRAMUSCULAR | Status: DC | PRN
  Administered 2014-06-08: 4 mg via INTRAVENOUS

## 2014-06-08 MED ORDER — PROMETHAZINE HCL 25 MG/ML IJ SOLN
6.2500 mg | INTRAMUSCULAR | Status: AC | PRN
  Administered 2014-06-08 (×2): 6.25 mg via INTRAVENOUS
  Filled 2014-06-08: qty 1

## 2014-06-08 MED ORDER — MIDAZOLAM HCL 2 MG/2ML IJ SOLN
INTRAMUSCULAR | Status: AC
  Filled 2014-06-08: qty 2

## 2014-06-08 MED ORDER — OXYBUTYNIN CHLORIDE 5 MG PO TABS
5.0000 mg | ORAL_TABLET | Freq: Three times a day (TID) | ORAL | Status: DC
  Administered 2014-06-08: 5 mg via ORAL
  Filled 2014-06-08: qty 1

## 2014-06-08 MED ORDER — HYDROCODONE-ACETAMINOPHEN 5-325 MG PO TABS: 1.0000 | ORAL_TABLET | ORAL | Status: DC | PRN

## 2014-06-08 MED ORDER — PROMETHAZINE HCL 25 MG/ML IJ SOLN
INTRAMUSCULAR | Status: AC
  Filled 2014-06-08: qty 1

## 2014-06-08 MED ORDER — LACTATED RINGERS IV SOLN
INTRAVENOUS | Status: DC
  Administered 2014-06-08 (×2): via INTRAVENOUS
  Filled 2014-06-08: qty 1000

## 2014-06-08 MED ORDER — STERILE WATER FOR IRRIGATION IR SOLN
Status: DC | PRN
  Administered 2014-06-08: 500 mL

## 2014-06-08 MED ORDER — SODIUM CHLORIDE 0.9 % IR SOLN
Status: DC | PRN
  Administered 2014-06-08: 4000 mL

## 2014-06-08 MED ORDER — GLYCOPYRROLATE 0.2 MG/ML IJ SOLN
INTRAMUSCULAR | Status: DC | PRN
  Administered 2014-06-08: 0.2 mg via INTRAVENOUS

## 2014-06-08 MED ORDER — ACETAMINOPHEN 10 MG/ML IV SOLN
INTRAVENOUS | Status: DC | PRN
  Administered 2014-06-08: 1000 mg via INTRAVENOUS

## 2014-06-08 MED ORDER — DEXTROSE 5 % IV SOLN
160.0000 mg | INTRAVENOUS | Status: DC
  Filled 2014-06-08: qty 4

## 2014-06-08 MED ORDER — HYDROCODONE-ACETAMINOPHEN 5-325 MG PO TABS
1.0000 | ORAL_TABLET | ORAL | Status: DC | PRN
  Administered 2014-06-08: 1 via ORAL
  Filled 2014-06-08: qty 2

## 2014-06-08 MED ORDER — IOHEXOL 350 MG/ML SOLN
INTRAVENOUS | Status: DC | PRN
  Administered 2014-06-08: 6 mL

## 2014-06-08 MED ORDER — MEPERIDINE HCL 25 MG/ML IJ SOLN
6.2500 mg | INTRAMUSCULAR | Status: DC | PRN
  Filled 2014-06-08: qty 1

## 2014-06-08 MED ORDER — MIDAZOLAM HCL 5 MG/5ML IJ SOLN
INTRAMUSCULAR | Status: DC | PRN
  Administered 2014-06-08: 2 mg via INTRAVENOUS

## 2014-06-08 SURGICAL SUPPLY — 30 items
ADAPTER CATH URET PLST 4-6FR (CATHETERS) IMPLANT
BAG DRAIN URO-CYSTO SKYTR STRL (DRAIN) ×2 IMPLANT
BASKET STNLS GEMINI 4WIRE 3FR (BASKET) IMPLANT
BASKET ZERO TIP NITINOL 2.4FR (BASKET) ×2 IMPLANT
CANISTER SUCT LVC 12 LTR MEDI- (MISCELLANEOUS) ×2 IMPLANT
CATH INTERMIT  6FR 70CM (CATHETERS) IMPLANT
CLOTH BEACON ORANGE TIMEOUT ST (SAFETY) ×2 IMPLANT
DRAPE CAMERA CLOSED 9X96 (DRAPES) ×2 IMPLANT
FIBER LASER FLEXIVA 200 (UROLOGICAL SUPPLIES) IMPLANT
FIBER LASER FLEXIVA 365 (UROLOGICAL SUPPLIES) ×2 IMPLANT
GLOVE BIO SURGEON STRL SZ7.5 (GLOVE) ×2 IMPLANT
GLOVE BIO SURGEON STRL SZ8 (GLOVE) ×2 IMPLANT
GLOVE INDICATOR 7.5 STRL GRN (GLOVE) ×4 IMPLANT
GOWN PREVENTION PLUS LG XLONG (DISPOSABLE) IMPLANT
GOWN STRL REIN XL XLG (GOWN DISPOSABLE) IMPLANT
GOWN STRL REUS W/ TWL XL LVL3 (GOWN DISPOSABLE) ×1 IMPLANT
GOWN STRL REUS W/TWL XL LVL3 (GOWN DISPOSABLE) ×3 IMPLANT
GUIDEWIRE 0.038 PTFE COATED (WIRE) IMPLANT
GUIDEWIRE ANG ZIPWIRE 038X150 (WIRE) IMPLANT
GUIDEWIRE STR DUAL SENSOR (WIRE) ×2 IMPLANT
IV NS 1000ML (IV SOLUTION) ×1
IV NS 1000ML BAXH (IV SOLUTION) ×1 IMPLANT
IV NS 500ML (IV SOLUTION) ×1
IV NS 500ML BAXH (IV SOLUTION) ×1 IMPLANT
IV NS IRRIG 3000ML ARTHROMATIC (IV SOLUTION) ×2 IMPLANT
NS IRRIG 500ML POUR BTL (IV SOLUTION) IMPLANT
PACK CYSTOSCOPY (CUSTOM PROCEDURE TRAY) ×2 IMPLANT
SHEATH ACCESS URETERAL 38CM (SHEATH) IMPLANT
STENT ×2 IMPLANT
STENT URET 6FRX24 CONTOUR (STENTS) ×2 IMPLANT

## 2014-06-08 NOTE — Anesthesia Procedure Notes (Signed)
Procedure Name: LMA Insertion Date/Time: 06/08/2014 7:33 AM Performed by: Mechele Claude Pre-anesthesia Checklist: Patient identified, Emergency Drugs available, Suction available and Patient being monitored Patient Re-evaluated:Patient Re-evaluated prior to inductionOxygen Delivery Method: Circle System Utilized Preoxygenation: Pre-oxygenation with 100% oxygen Intubation Type: IV induction Ventilation: Mask ventilation without difficulty LMA: LMA inserted LMA Size: 4.0 Number of attempts: 1 Airway Equipment and Method: bite block Placement Confirmation: positive ETCO2 Tube secured with: Tape Dental Injury: Teeth and Oropharynx as per pre-operative assessment

## 2014-06-08 NOTE — Transfer of Care (Signed)
Immediate Anesthesia Transfer of Care Note  Patient: [name]  Procedure(s) Performed: Procedure(s) (LRB): CYSTOSCOPY RIGHT RETROGRADE/URETEROSCOPY/STONE EXTRACTION WITH BASKET (Right)  HOLMIUM LASER APPLICATION (Right)  Patient Location: PACU  Anesthesia Type: General  Level of Consciousness: awake, alert  and oriented  Airway & Oxygen Therapy: Patient Spontanous Breathing and Patient connected to face mask oxygen  Post-op Assessment: Report given to PACU RN and Post -op Vital signs reviewed and stable  Post vital signs: Reviewed and stable  Complications: No apparent anesthesia complications

## 2014-06-08 NOTE — Anesthesia Preprocedure Evaluation (Addendum)
Anesthesia Evaluation  Patient identified by MRN, date of birth, ID band Patient awake    Reviewed: Allergy & Precautions, H&P , NPO status , Patient's Chart, lab work & pertinent test results  Airway Mallampati: II TM Distance: >3 FB Neck ROM: Full    Dental no notable dental hx.    Pulmonary asthma ,  MILD pulmonary FIBROSIS breath sounds clear to auscultation  Pulmonary exam normal       Cardiovascular negative cardio ROS  Rhythm:Regular Rate:Normal     Neuro/Psych negative neurological ROS  negative psych ROS   GI/Hepatic negative GI ROS, Neg liver ROS,   Endo/Other  negative endocrine ROS  Renal/GU negative Renal ROS  negative genitourinary   Musculoskeletal negative musculoskeletal ROS (+)   Abdominal   Peds negative pediatric ROS (+)  Hematology negative hematology ROS (+)   Anesthesia Other Findings   Reproductive/Obstetrics negative OB ROS                          Anesthesia Physical Anesthesia Plan  ASA: II  Anesthesia Plan: General   Post-op Pain Management:    Induction: Intravenous  Airway Management Planned: LMA and Oral ETT  Additional Equipment:   Intra-op Plan:   Post-operative Plan: Extubation in OR  Informed Consent: I have reviewed the patients History and Physical, chart, labs and discussed the procedure including the risks, benefits and alternatives for the proposed anesthesia with the patient or authorized representative who has indicated his/her understanding and acceptance.   Dental advisory given  Plan Discussed with: CRNA  Anesthesia Plan Comments:         Anesthesia Quick Evaluation

## 2014-06-08 NOTE — Anesthesia Postprocedure Evaluation (Signed)
  Anesthesia Post-op Note  Patient: [name]  Procedure(s) Performed: Procedure(s) (LRB): CYSTOSCOPY RIGHT RETROGRADE/URETEROSCOPY/STONE EXTRACTION WITH BASKET (Right)  HOLMIUM LASER APPLICATION (Right)  Patient Location: PACU  Anesthesia Type: General  Level of Consciousness: awake and alert   Airway and Oxygen Therapy: Patient Spontanous Breathing  Post-op Pain: mild  Post-op Assessment: Post-op Vital signs reviewed, Patient's Cardiovascular Status Stable, Respiratory Function Stable, Patent Airway and No signs of Nausea or vomiting  Last Vitals:  Filed Vitals:   06/08/14 0930  BP: 134/89  Pulse: 82  Temp:   Resp: 13    Post-op Vital Signs: stable   Complications: No apparent anesthesia complications

## 2014-06-08 NOTE — Op Note (Signed)
PATIENT:  [name]  PRE-OPERATIVE DIAGNOSIS: Right proximal ureteral stone, right ureteral wall thickening  POST-OPERATIVE DIAGNOSIS: Right mid ureteral stone, no evidence of ureteral or renal abnormality  PROCEDURE: Cystoscopy, right retrograde ureteropyelogram, interpretation of fluoroscopy, right ureteroscopy, holmium laser and extraction of right ureteral stone, double-J stent placement on right  SURGEON:  Lillette Boxer. Kodi Steil, M.D.  ANESTHESIA:  General  EBL:  Minimal  DRAINS: 24 cm x 6 French contour double-J stent with string  LOCAL MEDICATIONS USED:  None  SPECIMEN:  Stone fragments, the family  INDICATION: [name] is a 53 year old female with a symptomatic right proximal ureteral stone. She presented about 2 weeks ago with significant flank pain and hematuria. She was found to have a mild UTI but no fever. There was bladder wall and ureteral wall thickening on the CT scan. She presents at this time for cystoscopy, right retrograde ureteropyelogram and ureteroscopy for both diagnostic and stone therapeutic purposes.   Description of procedure: The patient was properly identified and marked (if applicable) in the holding area. They were then  taken to the operating room and placed on the table in a supine position. General anesthesia was then administered. Once fully anesthetized the patient was moved to the dorsolithotomy position and the genitalia and perineum were sterilely prepped and draped in standard fashion. An official timeout was then performed.  A 22 French panendoscope was advanced into her bladder. The bladder was inspected circumferentially with both the 12 and 70 lenses. Ureteral orifices were normal. Urothelium was normal without evidence of any tumor or foreign body. The right ureteral orifice was cannulated with a 6 [place] open-ended catheter.  etrograde ureteropyelogram was performed. This revealed a persistent filling defect in the mid ureter.  There was no significant hydronephrosis proximal to this. The pyelo-calyceal system on the right was normal. Following the retrograde, I advanced the open-ended catheter over a guidewire up into the right renal pelvis. With saline, washings were taken and sent for culture.  I then replaced the Center tip guidewire, and removed the open-ended catheter. I dilated the mid and distal ureter with the inner core of a 12/14 ureteral access sheath. I then passed a rigid ureteroscope alongside the guidewire up to the stone. It was measured as 8 mm on the CT scan, so it was not engaged at this point. The 365  fiber was used, and holmium laser energy applied to the stone. The stone was fragmented into 4-5 fairly large pieces plus many small sand like fragments. The larger fragments were grasped with the Nitinol basket and delivered into the bladder separately. I then passed the ureteroscope more proximally, and no further stones were seen in the ureter. Because of some renal pelvic thickening on the CT scan, I then used a digital flexible ureteroscope to access the pyelo-calyceal system. These were inspected. All calyces were entered. No stones were seen. No urothelial abnormalities were noted.  At this point, the digital flexible ureteroscope was removed over guidewire. I then, using cystoscopic and fluoroscopic guidance, passed a 24 cm x 6 French contour double-J stent, the string left on. The bladder was drained following irrigation of the stone fragments from the bladder. These were saved for the patient.  Following adequate placement of the stent, the bladder was drained, the string was taped to the patient's inner thigh. A B. and O. suppository was placed.    PLAN OF CARE: Discharge to home after PACU  PATIENT DISPOSITION:  PACU - hemodynamically stable.

## 2014-06-08 NOTE — Discharge Instructions (Signed)
[facility] [phone] Post Ureteroscopy With or Without Stent Instructions  Definitions:  Ureter: The duct that transports urine from the kidney to the bladder. Stent:   A plastic hollow tube that is placed into the ureter, from the kidney to the bladder to prevent the ureter from swelling shut.  GENERAL INSTRUCTIONS:  Despite the fact that no skin incisions were used, the area around the ureter and bladder is raw and irritated. The stent is a foreign body which will further irritate the bladder wall. This irritation is manifested by increased frequency of urination, both day and night, and by an increase in the urge to urinate. In some, the urge to urinate is present almost always. Sometimes the urge is strong enough that you may not be able to stop yourself from urinating. The only real cure is to remove the stent and then give time for the bladder wall to heal which can't be done until the danger of the ureter swelling shut has passed, which varies. Pull  string to remove stent on Thursday morning  You may see some blood in your urine while the stent is in place and a few days afterwards. Do not be alarmed, even if the urine was clear for a while. Get off your feet and drink lots of fluids until clearing occurs. If you start to pass clots or don't improve, call us.  DIET: You may return to your normal diet immediately. Because of the raw surface of your bladder, alcohol, spicy foods, acid type foods and drinks with caffeine may cause irritation or frequency and should be used in moderation. To keep your urine flowing freely and to avoid constipation, drink plenty of fluids during the day ( 8-10 glasses ). Tip: Avoid cranberry juice because it is very acidic.  ACTIVITY: Your physical activity doesn't need to be restricted. However, if you are very active, you may see some blood in your urine. We suggest that you reduce your activity under these circumstances until the  bleeding has stopped.  BOWELS: It is important to keep your bowels regular during the postoperative period. Straining with bowel movements can cause bleeding. A bowel movement every other day is reasonable. Use a mild laxative if needed, such as Milk of Magnesia 2-3 tablespoons, or 2 Dulcolax tablets. Call if you continue to have problems. If you have been taking narcotics for pain, before, during or after your surgery, you may be constipated. Take a laxative if necessary.   MEDICATION: You should resume your pre-surgery medications unless told not to. In addition you will often be given an antibiotic to prevent infection. These should be taken as prescribed until the bottles are finished unless you are having an unusual reaction to one of the drugs.  PROBLEMS YOU SHOULD REPORT TO [place]:  Fevers over 100.5 Fahrenheit.  Heavy bleeding, or clots ( See above notes about blood in urine ).  Inability to urinate.  Drug reactions ( hives, rash, nausea, vomiting, diarrhea ).  Severe burning or pain with urination that is not improving.  FOLLOW-UP: You will need a follow-up appointment to monitor your progress. Call for this appointment at the number listed above. Usually the first appointment will be about three to fourteen days after your sur Post Anesthesia Home Care Instructions  Activity: Get plenty of rest for the remainder of the day. A responsible adult should stay with you for 24 hours following the procedure.  For the next 24 hours, DO NOT: -Drive a car Film/video editor -  Drink alcoholic beverages -Take any medication unless instructed by your physician -Make any legal decisions or sign important papers.  Meals: Start with liquid foods such as gelatin or soup. Progress to regular foods as tolerated. Avoid greasy, spicy, heavy foods. If nausea and/or vomiting occur, drink only clear liquids until the nausea and/or vomiting subsides. Call your physician if vomiting  continues.  Special Instructions/Symptoms: Your throat may feel dry or sore from the anesthesia or the breathing tube placed in your throat during surgery. If this causes discomfort, gargle with warm salt water. The discomfort should disappear within 24 hours.

## 2014-06-09 ENCOUNTER — Encounter (HOSPITAL_BASED_OUTPATIENT_CLINIC_OR_DEPARTMENT_OTHER): Payer: Self-pay | Admitting: Urology

## 2014-06-17 ENCOUNTER — Encounter (HOSPITAL_BASED_OUTPATIENT_CLINIC_OR_DEPARTMENT_OTHER): Payer: Self-pay | Admitting: *Deleted

## 2014-06-17 ENCOUNTER — Other Ambulatory Visit: Payer: Self-pay | Admitting: Urology

## 2014-06-17 NOTE — Progress Notes (Signed)
NPO AFTER MN. ARRIVE AT 1100. CURRENT ISTAT 8 IN EPIC AND CHART. MAY TAKE PAIN RX IF NEEDED AM DOS W/ SIPS OF WATER.

## 2014-06-18 ENCOUNTER — Ambulatory Visit (HOSPITAL_BASED_OUTPATIENT_CLINIC_OR_DEPARTMENT_OTHER)
Admission: RE | Admit: 2014-06-18 | Discharge: 2014-06-18 | Disposition: A | Discharge status: Home / Self Care | Payer: 59 | Source: Ambulatory Visit | Attending: Urology | Admitting: Urology

## 2014-06-18 ENCOUNTER — Encounter (HOSPITAL_BASED_OUTPATIENT_CLINIC_OR_DEPARTMENT_OTHER)
Admission: RE | Disposition: A | Discharge status: Home / Self Care | Payer: Self-pay | Source: Ambulatory Visit | Attending: Urology

## 2014-06-18 ENCOUNTER — Encounter (HOSPITAL_BASED_OUTPATIENT_CLINIC_OR_DEPARTMENT_OTHER): Payer: Self-pay

## 2014-06-18 ENCOUNTER — Ambulatory Visit (HOSPITAL_BASED_OUTPATIENT_CLINIC_OR_DEPARTMENT_OTHER): Payer: 59 | Admitting: Anesthesiology

## 2014-06-18 ENCOUNTER — Encounter (HOSPITAL_BASED_OUTPATIENT_CLINIC_OR_DEPARTMENT_OTHER): Payer: 59 | Admitting: Anesthesiology

## 2014-06-18 DIAGNOSIS — F329 Major depressive disorder, single episode, unspecified: Secondary | ICD-10-CM | POA: Diagnosis not present

## 2014-06-18 DIAGNOSIS — Z79899 Other long term (current) drug therapy: Secondary | ICD-10-CM | POA: Diagnosis not present

## 2014-06-18 DIAGNOSIS — J8410 Pulmonary fibrosis, unspecified: Secondary | ICD-10-CM | POA: Insufficient documentation

## 2014-06-18 DIAGNOSIS — N1330 Unspecified hydronephrosis: Secondary | ICD-10-CM | POA: Insufficient documentation

## 2014-06-18 DIAGNOSIS — F3289 Other specified depressive episodes: Secondary | ICD-10-CM | POA: Insufficient documentation

## 2014-06-18 DIAGNOSIS — J45909 Unspecified asthma, uncomplicated: Secondary | ICD-10-CM | POA: Insufficient documentation

## 2014-06-18 DIAGNOSIS — Z9089 Acquired absence of other organs: Secondary | ICD-10-CM | POA: Diagnosis not present

## 2014-06-18 DIAGNOSIS — N201 Calculus of ureter: Secondary | ICD-10-CM | POA: Diagnosis not present

## 2014-06-18 HISTORY — PX: CYSTOSCOPY WITH RETROGRADE PYELOGRAM, URETEROSCOPY AND STENT PLACEMENT: SHX5789

## 2014-06-18 HISTORY — DX: Unspecified hydronephrosis: N13.30

## 2014-06-18 LAB — POCT I-STAT, CHEM 8
BUN: 19 mg/dL (ref 6–23)
CREATININE: 1 mg/dL (ref 0.50–1.10)
Calcium, Ion: 1.25 mmol/L — ABNORMAL HIGH (ref 1.12–1.23)
Chloride: 109 mEq/L (ref 96–112)
Glucose, Bld: 100 mg/dL — ABNORMAL HIGH (ref 70–99)
HCT: 38 % (ref 36.0–46.0)
Hemoglobin: 12.9 g/dL (ref 12.0–15.0)
Potassium: 3.7 mEq/L (ref 3.7–5.3)
Sodium: 144 mEq/L (ref 137–147)
TCO2: 19 mmol/L (ref 0–100)

## 2014-06-18 SURGERY — CYSTOURETEROSCOPY, WITH RETROGRADE PYELOGRAM AND STENT INSERTION
Anesthesia: General | Site: Ureter | Laterality: Right

## 2014-06-18 MED ORDER — ONDANSETRON HCL 4 MG/2ML IJ SOLN
INTRAMUSCULAR | Status: DC | PRN
  Administered 2014-06-18: 4 mg via INTRAVENOUS

## 2014-06-18 MED ORDER — DEXAMETHASONE SODIUM PHOSPHATE 4 MG/ML IJ SOLN
INTRAMUSCULAR | Status: DC | PRN
  Administered 2014-06-18: 10 mg via INTRAVENOUS

## 2014-06-18 MED ORDER — PROMETHAZINE HCL 25 MG/ML IJ SOLN
6.2500 mg | INTRAMUSCULAR | Status: DC | PRN
  Administered 2014-06-18: 12.5 mg via INTRAVENOUS
  Filled 2014-06-18: qty 1

## 2014-06-18 MED ORDER — PROMETHAZINE HCL 25 MG/ML IJ SOLN
INTRAMUSCULAR | Status: AC
Start: 2014-06-18 — End: 2014-06-18
  Filled 2014-06-18: qty 1

## 2014-06-18 MED ORDER — MIDAZOLAM HCL 2 MG/2ML IJ SOLN
INTRAMUSCULAR | Status: AC
  Filled 2014-06-18: qty 2

## 2014-06-18 MED ORDER — FENTANYL CITRATE 0.05 MG/ML IJ SOLN
INTRAMUSCULAR | Status: DC | PRN
  Administered 2014-06-18: 50 ug via INTRAVENOUS

## 2014-06-18 MED ORDER — MIDAZOLAM HCL 5 MG/5ML IJ SOLN
INTRAMUSCULAR | Status: DC | PRN
  Administered 2014-06-18: 2 mg via INTRAVENOUS

## 2014-06-18 MED ORDER — SODIUM CHLORIDE 0.9 % IR SOLN
Status: DC | PRN
  Administered 2014-06-18: 4000 mL

## 2014-06-18 MED ORDER — LACTATED RINGERS IV SOLN
INTRAVENOUS | Status: DC
  Filled 2014-06-18: qty 1000

## 2014-06-18 MED ORDER — PROPOFOL 10 MG/ML IV BOLUS
INTRAVENOUS | Status: DC | PRN
  Administered 2014-06-18: 20 mg via INTRAVENOUS
  Administered 2014-06-18: 180 mg via INTRAVENOUS

## 2014-06-18 MED ORDER — GENTAMICIN SULFATE 40 MG/ML IJ SOLN
5.0000 mg/kg | INTRAVENOUS | Status: AC
  Administered 2014-06-18: 400 mg via INTRAVENOUS
  Filled 2014-06-18: qty 10

## 2014-06-18 MED ORDER — LACTATED RINGERS IV SOLN
INTRAVENOUS | Status: DC
  Administered 2014-06-18: 11:00:00 via INTRAVENOUS
  Filled 2014-06-18: qty 1000

## 2014-06-18 MED ORDER — FENTANYL CITRATE 0.05 MG/ML IJ SOLN
25.0000 ug | INTRAMUSCULAR | Status: DC | PRN
  Filled 2014-06-18: qty 1

## 2014-06-18 MED ORDER — LIDOCAINE HCL (CARDIAC) 20 MG/ML IV SOLN
INTRAVENOUS | Status: DC | PRN
  Administered 2014-06-18: 50 mg via INTRAVENOUS

## 2014-06-18 MED ORDER — KETOROLAC TROMETHAMINE 30 MG/ML IJ SOLN
INTRAMUSCULAR | Status: DC | PRN
  Administered 2014-06-18: 30 mg via INTRAVENOUS

## 2014-06-18 MED ORDER — FENTANYL CITRATE 0.05 MG/ML IJ SOLN
INTRAMUSCULAR | Status: AC
  Filled 2014-06-18: qty 4

## 2014-06-18 MED ORDER — MEPERIDINE HCL 25 MG/ML IJ SOLN
6.2500 mg | INTRAMUSCULAR | Status: DC | PRN
  Filled 2014-06-18: qty 1

## 2014-06-18 MED ORDER — IOHEXOL 350 MG/ML SOLN
INTRAVENOUS | Status: DC | PRN
  Administered 2014-06-18: 8 mL via URETHRAL

## 2014-06-18 MED ORDER — GLYCOPYRROLATE 0.2 MG/ML IJ SOLN
INTRAMUSCULAR | Status: DC | PRN
  Administered 2014-06-18: 0.2 mg via INTRAVENOUS

## 2014-06-18 SURGICAL SUPPLY — 35 items
ADAPTER CATH URET PLST 4-6FR (CATHETERS) IMPLANT
BAG DRAIN URO-CYSTO SKYTR STRL (DRAIN) ×3 IMPLANT
BASKET LASER NITINOL 1.9FR (BASKET) IMPLANT
BASKET STNLS GEMINI 4WIRE 3FR (BASKET) IMPLANT
BASKET ZERO TIP NITINOL 2.4FR (BASKET) IMPLANT
CANISTER SUCT LVC 12 LTR MEDI- (MISCELLANEOUS) ×3 IMPLANT
CATH INTERMIT  6FR 70CM (CATHETERS) IMPLANT
CATH URET 5FR 28IN CONE TIP (BALLOONS)
CATH URET 5FR 28IN OPEN ENDED (CATHETERS) IMPLANT
CATH URET 5FR 70CM CONE TIP (BALLOONS) IMPLANT
CLOTH BEACON ORANGE TIMEOUT ST (SAFETY) ×3 IMPLANT
DRAPE CAMERA CLOSED 9X96 (DRAPES) ×3 IMPLANT
FIBER LASER FLEXIVA 1000 (UROLOGICAL SUPPLIES) IMPLANT
FIBER LASER FLEXIVA 200 (UROLOGICAL SUPPLIES) IMPLANT
FIBER LASER FLEXIVA 365 (UROLOGICAL SUPPLIES) IMPLANT
FIBER LASER FLEXIVA 550 (UROLOGICAL SUPPLIES) IMPLANT
GLOVE BIO SURGEON STRL SZ7 (GLOVE) ×3 IMPLANT
GLOVE SURG SS PI 7.5 STRL IVOR (GLOVE) ×6 IMPLANT
GOWN STRL REUS W/TWL LRG LVL3 (GOWN DISPOSABLE) ×3 IMPLANT
GOWN STRL REUS W/TWL XL LVL3 (GOWN DISPOSABLE) ×3 IMPLANT
GUIDEWIRE 0.038 PTFE COATED (WIRE) ×3 IMPLANT
GUIDEWIRE ANG ZIPWIRE 038X150 (WIRE) IMPLANT
GUIDEWIRE STR DUAL SENSOR (WIRE) IMPLANT
IV NS 1000ML (IV SOLUTION) ×1
IV NS 1000ML BAXH (IV SOLUTION) ×2 IMPLANT
IV NS IRRIG 3000ML ARTHROMATIC (IV SOLUTION) ×3 IMPLANT
KIT BALLIN UROMAX 15FX10 (LABEL) IMPLANT
KIT BALLN UROMAX 15FX4 (MISCELLANEOUS) IMPLANT
KIT BALLN UROMAX 26 75X4 (MISCELLANEOUS)
NS IRRIG 500ML POUR BTL (IV SOLUTION) IMPLANT
PACK CYSTOSCOPY (CUSTOM PROCEDURE TRAY) ×3 IMPLANT
SET HIGH PRES BAL DIL (LABEL)
SHEATH URET ACCESS 12FR/35CM (UROLOGICAL SUPPLIES) IMPLANT
SHEATH URET ACCESS 12FR/55CM (UROLOGICAL SUPPLIES) IMPLANT
STENT POLARIS LOOP 6FR X 24 CM (STENTS) ×3 IMPLANT

## 2014-06-18 NOTE — Discharge Instructions (Signed)
Post Anesthesia Home Care Instructions  Activity: Get plenty of rest for the remainder of the day. A responsible adult should stay with you for 24 hours following the procedure.  For the next 24 hours, DO NOT: -Drive a car -Operate machinery -Drink alcoholic beverages -Take any medication unless instructed by your physician -Make any legal decisions or sign important papers.  Meals: Start with liquid foods such as gelatin or soup. Progress to regular foods as tolerated. Avoid greasy, spicy, heavy foods. If nausea and/or vomiting occur, drink only clear liquids until the nausea and/or vomiting subsides. Call your physician if vomiting continues.  Special Instructions/Symptoms: Your throat may feel dry or sore from the anesthesia or the breathing tube placed in your throat during surgery. If this causes discomfort, gargle with warm salt water. The discomfort should disappear within 24 hours.      [facility] [phone] Post Ureteroscopy With or Without Stent Instructions  Definitions:  Ureter: The duct that transports urine from the kidney to the bladder. Stent:   A plastic hollow tube that is placed into the ureter, from the kidney to the bladder to prevent the ureter from swelling shut.  GENERAL INSTRUCTIONS:  Despite the fact that no skin incisions were used, the area around the ureter and bladder is raw and irritated. The stent is a foreign body which will further irritate the bladder wall. This irritation is manifested by increased frequency of urination, both day and night, and by an increase in the urge to urinate. In some, the urge to urinate is present almost always. Sometimes the urge is strong enough that you may not be able to stop yourself from urinating. The only real cure is to remove the stent and then give time for the bladder wall to heal which can't be done until the danger of the ureter swelling shut has passed, which varies.  You may see some  blood in your urine while the stent is in place and a few days afterwards. Do not be alarmed, even if the urine was clear for a while. Get off your feet and drink lots of fluids until clearing occurs. If you start to pass clots or don't improve, call us.  DIET: You may return to your normal diet immediately. Because of the raw surface of your bladder, alcohol, spicy foods, acid type foods and drinks with caffeine may cause irritation or frequency and should be used in moderation. To keep your urine flowing freely and to avoid constipation, drink plenty of fluids during the day ( 8-10 glasses ). Tip: Avoid cranberry juice because it is very acidic.  ACTIVITY: Your physical activity doesn't need to be restricted. However, if you are very active, you may see some blood in your urine. We suggest that you reduce your activity under these circumstances until the bleeding has stopped.  BOWELS: It is important to keep your bowels regular during the postoperative period. Straining with bowel movements can cause bleeding. A bowel movement every other day is reasonable. Use a mild laxative if needed, such as Milk of Magnesia 2-3 tablespoons, or 2 Dulcolax tablets. Call if you continue to have problems. If you have been taking narcotics for pain, before, during or after your surgery, you may be constipated. Take a laxative if necessary.   MEDICATION: You should resume your pre-surgery medications unless told not to. In addition you will often be given an antibiotic to prevent infection. These should be taken as prescribed until the bottles are finished unless   you are having an unusual reaction to one of the drugs.  PROBLEMS YOU SHOULD REPORT TO US: Fevers over 100.5 Fahrenheit. Heavy bleeding, or clots ( See above notes about blood in urine ). Inability to urinate. Drug reactions ( hives, rash, nausea, vomiting, diarrhea ). Severe burning or pain with urination that is not improving.  FOLLOW-UP: You will  need a follow-up appointment to monitor your progress. Call for this appointment at the number listed above. Usually the first appointment will be about three to fourteen days after your surgery.      

## 2014-06-18 NOTE — Op Note (Signed)
[name] is a 53 y.o.   06/18/2014  General  Pre-op diagnosis: Right flank pain, status post right ureteral stone extraction.  Postop diagnosis: Same plus right hydronephrosis  Procedure done: Cystoscopy, right retrograde pyelogram, right double-J stent  Surgeon: Charlene Brooke. [name]  Anesthesia: General  Indication: Patient is a 53 years old female who had right ureteral stone extraction on 7/13. She had a double-J stent that she removed about 7 days ago. Since then she's been having chronic right flank pain. The pain is not relieved by analgesics. 3 days ago she had a temperature up to 101 but she has been afebrile since. Since she continues to have pain she is scheduled today for cystoscopy, retrograde pyelogram and double-J stent placement.  Procedure: The patient was identified by her wrist band and proper timeout was taken.  Under general anesthesia patient was prepped and draped and placed in the dorsolithotomy position. A panendoscope was inserted in the bladder. There is marked edema of the right side of the trigone. There is no stone or tumor in the bladder.  Right retrograde pyelogram:  A cone-tip catheter was passed through the cystoscope and the right ureteral orifice. Contrast was injected through the cone-tip catheter. The caliber of the distal ureter is small the ureter proximal to the distal ureter is moderately dilated. The renal pelvis and calyces also moderately dilated. The cone-tip catheter was removed. A sensor wire was passed through the cystoscope and advanced up to the renal pelvis. Then a #6 [place]- [id number] Polaris stent was passed over the sensor wire. When the proximal end of the stent was noted in the renal pelvis the sensor wire was removed. The proximal curl of the stent is in the renal pelvis and the distal loops of the stent are in the bladder. The bladder was then emptied and the cystoscope was removed.  The patient tolerated the procedure well and left the or  in satisfactory condition to postanesthesia care unit

## 2014-06-18 NOTE — Anesthesia Procedure Notes (Signed)
Procedure Name: LMA Insertion Date/Time: 06/18/2014 12:35 PM Performed by: Bethena Roys T Pre-anesthesia Checklist: Patient identified, Emergency Drugs available, Suction available and Patient being monitored Patient Re-evaluated:Patient Re-evaluated prior to inductionOxygen Delivery Method: Circle System Utilized Preoxygenation: Pre-oxygenation with 100% oxygen Intubation Type: IV induction Ventilation: Mask ventilation without difficulty LMA: LMA inserted LMA Size: 4.0 Number of attempts: 1 Airway Equipment and Method: bite block Placement Confirmation: positive ETCO2 Dental Injury: Teeth and Oropharynx as per pre-operative assessment

## 2014-06-18 NOTE — Anesthesia Preprocedure Evaluation (Signed)
Anesthesia Evaluation  Patient identified by MRN, date of birth, ID band Patient awake    Reviewed: Allergy & Precautions, H&P , NPO status , Patient's Chart, lab work & pertinent test results  Airway Mallampati: II TM Distance: >3 FB Neck ROM: Full    Dental no notable dental hx.    Pulmonary asthma ,  MILD pulmonary FIBROSIS breath sounds clear to auscultation  Pulmonary exam normal       Cardiovascular negative cardio ROS  Rhythm:Regular Rate:Normal     Neuro/Psych negative neurological ROS  negative psych ROS   GI/Hepatic negative GI ROS, Neg liver ROS,   Endo/Other  negative endocrine ROS  Renal/GU negative Renal ROS  negative genitourinary   Musculoskeletal negative musculoskeletal ROS (+)   Abdominal   Peds negative pediatric ROS (+)  Hematology negative hematology ROS (+)   Anesthesia Other Findings   Reproductive/Obstetrics negative OB ROS                           Anesthesia Physical  Anesthesia Plan  ASA: II  Anesthesia Plan: General   Post-op Pain Management:    Induction: Intravenous  Airway Management Planned: LMA and Oral ETT  Additional Equipment:   Intra-op Plan:   Post-operative Plan: Extubation in OR  Informed Consent: I have reviewed the patients History and Physical, chart, labs and discussed the procedure including the risks, benefits and alternatives for the proposed anesthesia with the patient or authorized representative who has indicated his/her understanding and acceptance.   Dental advisory given  Plan Discussed with: CRNA  Anesthesia Plan Comments:         Anesthesia Quick Evaluation

## 2014-06-18 NOTE — H&P (Signed)
History of Present Illness     53 YO female patient of Dr. Alan Ripper seen today for complaint of flank pain post double J stent removal. S/P cystourethroscopy, R RPG, holium laser stone extraction, and placement of double J stent w/tether 06/08/14.  Interval  HX:  Today states that she removed stent 6 days ago and since that time has had chronic right flank pain and bladder pressure that is not relieved with PRN Percocet or Uribel. 48 hrs ago had fever of 101 but has been afebrile since than. Today temp: 98.8. Denies constipation or vomiting but has had some mild nausea.   Past Medical History Problems   1. History of Calculus of right ureter (592.1)  2. History of depression (V11.8)  3. History of kidney stones (V13.01)  Surgical History Problems   1. History of Cesarean Section  2. History of Cholecystectomy  3. History of Hysterectomy  4. History of Kidney Surgery  5. History of Kidney Surgery  6. History of Lithotripsy  Current Meds  1. Cymbalta 60 MG Oral Capsule Delayed Release Particles;  Therapy: 21Aug2013 to Recorded  2. Estrace 2 MG Oral Tablet;  Therapy: (Recorded:24Jan2014) to Recorded  3. Nitrofurantoin Macrocrystal 50 MG Oral Capsule; 1HS - TAKE ONE CAPSULE BY MOUTH  AT BEDTIME;  Therapy: [phone] to (Evaluate:23Nov2015)  Requested for: [phone]; Last  Rx:26Jun2015 Ordered  4. Oxybutynin Chloride 5 MG/5ML Oral Syrup;  Therapy: (Recorded:22Jul2015) to Recorded  5. Topiramate 50 MG Oral Tablet;  Therapy: [id number] to Recorded  6. Trimethoprim 100 MG Oral Tablet;  Therapy: (FFMBWGYK:[id number]) to Recorded  7. Uribel CAPS;  Therapy: (Recorded:22Jul2015) to Recorded  Allergies Medication   1. Penicillins  Family History Problems   1. Family history of Blood In Urine : Mother  2. Family history of Cervical Cancer  3. Family history of Death In The Family Father   Deceased at age 83; cancer  48. Family history of Family Health Status Number Of Children    1 son & 1 daughter  5. Family history of Hematuria : Mother  79. Family history of Lung Cancer (V16.1) : Father  7. Family history of Nephrolithiasis : Mother  Social History Problems   1. Denied: Alcohol Use  2. Denied: History of Caffeine Use  3. Marital History - Currently Married  4. Never A Smoker  5. Occupation:   Glass blower/designer  Review of Systems Genitourinary, constitutional, skin, eye, otolaryngeal, hematologic/lymphatic, cardiovascular, pulmonary, endocrine, musculoskeletal, gastrointestinal, neurological and psychiatric system(s) were reviewed and pertinent findings if present are noted.  Genitourinary: urinary urgency and bladder pain.  Gastrointestinal: nausea and flank pain.    Vitals Vital Signs [Data Includes: Last 1 Day]  Recorded: 22Jul2015 01:14PM  Height: 5 ft 5 in Weight: 170 lb  BMI Calculated: 28.29 BSA Calculated: 1.85 Blood Pressure: 128 / 83 Temperature: 98.8 F Heart Rate: 71  Physical Exam Constitutional: Well nourished and well developed . No acute distress. The patient appears well hydrated.  ENT:. The ears and nose are normal in appearance.  Neck: The appearance of the neck is normal and no neck mass is present.  Pulmonary: No respiratory distress and normal respiratory rhythm and effort.  Cardiovascular: Heart rate and rhythm are normal . No peripheral edema.  Abdomen: The abdomen is flat. The abdomen is soft and nontender. No masses are palpated. Mild suprapubic tenderness is present. mild right CVA tenderness and no left CVA tenderness no CVA tenderness. No hernias are palpable. No hepatosplenomegaly noted.  Genitourinary:  There is  no urethral mass. The bladder is non tender and not distended.  Lymphatics: The femoral and inguinal nodes are not enlarged or tender.  Skin: Normal skin turgor, no visible rash, no visible skin lesions and normal skin color and pigmentation.  Neuro/Psych:. Mood and affect are appropriate.     Results/Data Urine [Data Includes: Last 1 Day]   [id number]  COLOR GREEN  GREEN   APPEARANCE CLEAR  CLOUDY   SPECIFIC GRAVITY 1.010  1.010   pH 7.5  7.5   GLUCOSE NEG mg/dL NEG mg/dL  BILIRUBIN NEG  NEG   KETONE NEG mg/dL NEG mg/dL  BLOOD SMALL  MOD   PROTEIN 30 mg/dL 100 mg/dL  UROBILINOGEN 0.2 mg/dL 0.2 mg/dL  NITRITE NEG  NEG   LEUKOCYTE ESTERASE SMALL  TRACE   SQUAMOUS EPITHELIAL/HPF MODERATE  FEW   WBC 11-20 WBC/hpf 11-20 WBC/hpf  RBC 3-6 RBC/hpf 11-20 RBC/hpf  BACTERIA FEW  FEW   CRYSTALS NONE SEEN  NONE SEEN   CASTS NONE SEEN  NONE SEEN    The following images/tracing/specimen were independently visualized:  KUB: shows no ureteral stone along right ureter. Stable right pelvic calcifications seen on CT urogram and scout film.  RUS: limited (R): 13.13 X 1.14 X 6.16 X 6.81 cm. Mod/severe hydronephrosis with hydroureter. Proximal ureter: 1.51 cm. 1.18 cm.  The following clinical lab reports were reviewed:  UA X 2. Voided specimen with large amount of epithelial cells and few bacteria. Will obtain cath UA.  PVR: Ultrasound PVR 23.28 ml. Cathed PVR 15 ml.    Procedure  I&O cath for specimen by nursing staff. Tolerated well.     Assessment Assessed   1. Flank pain (789.09)  2. Urinary urgency (788.63)  3. Hydronephrosis, right (591)  Plan  Flank pain   1. Administered: Ketorolac Tromethamine 60 MG/2ML Injection Solution  2. KUB; Status:Complete;   Done: [id number] 12:00AM  3. RENAL U/S RIGHT; Status:Complete;   Done: [id number] 12:00AM Flank pain, Hydronephrosis, right   4. Start: Oxycodone-Acetaminophen 7.5-325 MG Oral Tablet; MAY TAKE 1 TABLET PO Q3-4   HRS PRN Health Maintenance   5. UA With REFLEX; [Do Not Release]; Status:Complete;   Done: [id number] 01:07PM  6. UA With REFLEX; [Do Not Release]; Status:Complete;   Done: [id number] 01:46PM Pyuria, Urinary urgency   7. Cath for PVR/Specimen; Status:Complete;   Done: [id number]         Ketorolac 60 mg IM today   Oyxcodone 7.5/325 mg 1-2 po Q3-4 hrs prn pain #40/0RF Dr. Diona Fanti discussed replacing stent either today or tomorrow. With her eating lunch 3 hrs ago decided to have on-call MD proceed with cystoscopy. R RPG, and double J stent replacement 06/18/14. May need to maintain stent for several weeks post procedure. Will culture urine. No ABX at this time.

## 2014-06-18 NOTE — Transfer of Care (Signed)
Immediate Anesthesia Transfer of Care Note  Patient: [name]  Procedure(s) Performed: Procedure(s): CYSTOSCOPY WITH RETROGRADE PYELOGRAM,  STENT PLACEMENT (Right)  Patient Location: PACU  Anesthesia Type:General  Level of Consciousness: awake and oriented  Airway & Oxygen Therapy: Patient Spontanous Breathing and Patient connected to nasal cannula oxygen  Post-op Assessment: Report given to PACU RN  Post vital signs: Reviewed and stable  Complications: none

## 2014-06-19 ENCOUNTER — Encounter (HOSPITAL_BASED_OUTPATIENT_CLINIC_OR_DEPARTMENT_OTHER): Payer: Self-pay | Admitting: Urology

## 2014-06-19 NOTE — Anesthesia Postprocedure Evaluation (Signed)
  Anesthesia Post-op Note  Patient: [name]  Procedure(s) Performed: Procedure(s) (LRB): CYSTOSCOPY WITH RETROGRADE PYELOGRAM,  STENT PLACEMENT (Right)  Patient Location: PACU  Anesthesia Type: General  Level of Consciousness: awake and alert   Airway and Oxygen Therapy: Patient Spontanous Breathing  Post-op Pain: mild  Post-op Assessment: Post-op Vital signs reviewed, Patient's Cardiovascular Status Stable, Respiratory Function Stable, Patent Airway and No signs of Nausea or vomiting  Last Vitals:  Filed Vitals:   06/18/14 1518  BP: 142/87  Pulse: 52  Temp: 36 C  Resp: 18    Post-op Vital Signs: stable   Complications: No apparent anesthesia complications

## 2014-07-13 ENCOUNTER — Ambulatory Visit (HOSPITAL_COMMUNITY)
Admission: RE | Admit: 2014-07-13 | Discharge: 2014-07-15 | Disposition: A | Discharge status: Home / Self Care | Payer: 59 | Source: Ambulatory Visit | Attending: Urology | Admitting: Urology

## 2014-07-13 ENCOUNTER — Ambulatory Visit (HOSPITAL_COMMUNITY): Payer: 59 | Admitting: Certified Registered Nurse Anesthetist

## 2014-07-13 ENCOUNTER — Other Ambulatory Visit: Payer: Self-pay | Admitting: Urology

## 2014-07-13 ENCOUNTER — Encounter (HOSPITAL_COMMUNITY): Payer: Self-pay | Admitting: *Deleted

## 2014-07-13 ENCOUNTER — Encounter (HOSPITAL_COMMUNITY): Payer: 59 | Admitting: Certified Registered Nurse Anesthetist

## 2014-07-13 ENCOUNTER — Encounter (HOSPITAL_COMMUNITY)
Admission: RE | Disposition: A | Discharge status: Home / Self Care | Payer: Self-pay | Source: Ambulatory Visit | Attending: Urology

## 2014-07-13 DIAGNOSIS — K589 Irritable bowel syndrome without diarrhea: Secondary | ICD-10-CM | POA: Diagnosis not present

## 2014-07-13 DIAGNOSIS — N200 Calculus of kidney: Secondary | ICD-10-CM

## 2014-07-13 DIAGNOSIS — Q615 Medullary cystic kidney: Secondary | ICD-10-CM | POA: Diagnosis not present

## 2014-07-13 DIAGNOSIS — Z860100 Personal history of colon polyps, unspecified: Secondary | ICD-10-CM | POA: Insufficient documentation

## 2014-07-13 DIAGNOSIS — G43909 Migraine, unspecified, not intractable, without status migrainosus: Secondary | ICD-10-CM | POA: Insufficient documentation

## 2014-07-13 DIAGNOSIS — F3289 Other specified depressive episodes: Secondary | ICD-10-CM | POA: Diagnosis not present

## 2014-07-13 DIAGNOSIS — F329 Major depressive disorder, single episode, unspecified: Secondary | ICD-10-CM | POA: Insufficient documentation

## 2014-07-13 DIAGNOSIS — N1330 Unspecified hydronephrosis: Secondary | ICD-10-CM | POA: Insufficient documentation

## 2014-07-13 DIAGNOSIS — F411 Generalized anxiety disorder: Secondary | ICD-10-CM | POA: Diagnosis not present

## 2014-07-13 DIAGNOSIS — Z466 Encounter for fitting and adjustment of urinary device: Secondary | ICD-10-CM | POA: Diagnosis not present

## 2014-07-13 DIAGNOSIS — R091 Pleurisy: Secondary | ICD-10-CM | POA: Insufficient documentation

## 2014-07-13 DIAGNOSIS — J45909 Unspecified asthma, uncomplicated: Secondary | ICD-10-CM | POA: Diagnosis not present

## 2014-07-13 HISTORY — PX: HOLMIUM LASER APPLICATION: SHX5852

## 2014-07-13 HISTORY — PX: CYSTOSCOPY/RETROGRADE/URETEROSCOPY: SHX5316

## 2014-07-13 LAB — CREATININE, SERUM
Creatinine, Ser: 0.97 mg/dL (ref 0.50–1.10)
GFR calc Af Amer: 76 mL/min — ABNORMAL LOW (ref >90)
GFR calc non Af Amer: 66 mL/min — ABNORMAL LOW (ref >90)

## 2014-07-13 LAB — LIPASE, BLOOD: Lipase: 82 U/L — ABNORMAL HIGH (ref 11–59)

## 2014-07-13 LAB — AMYLASE: AMYLASE: 83 U/L (ref 0–105)

## 2014-07-13 SURGERY — CYSTOSCOPY/RETROGRADE/URETEROSCOPY
Anesthesia: General | Laterality: Right

## 2014-07-13 MED ORDER — CIPROFLOXACIN IN D5W 400 MG/200ML IV SOLN
400.0000 mg | Freq: Two times a day (BID) | INTRAVENOUS | Status: DC
  Administered 2014-07-13 – 2014-07-14 (×3): 400 mg via INTRAVENOUS
  Filled 2014-07-13 (×4): qty 200

## 2014-07-13 MED ORDER — GLYCOPYRROLATE 0.2 MG/ML IJ SOLN
INTRAMUSCULAR | Status: DC | PRN
  Administered 2014-07-13 (×2): 0.1 mg via INTRAVENOUS

## 2014-07-13 MED ORDER — ACETAMINOPHEN 325 MG PO TABS: 650.0000 mg | ORAL_TABLET | ORAL | Status: DC | PRN

## 2014-07-13 MED ORDER — PROPOFOL 10 MG/ML IV BOLUS
INTRAVENOUS | Status: DC | PRN
  Administered 2014-07-13: 25 mg via INTRAVENOUS
  Administered 2014-07-13: 175 mg via INTRAVENOUS

## 2014-07-13 MED ORDER — OXYCODONE-ACETAMINOPHEN 5-325 MG PO TABS
1.0000 | ORAL_TABLET | ORAL | Status: DC | PRN
  Administered 2014-07-14 – 2014-07-15 (×4): 2 via ORAL
  Filled 2014-07-13 (×4): qty 2

## 2014-07-13 MED ORDER — CIPROFLOXACIN HCL 500 MG PO TABS: 250.0000 mg | ORAL_TABLET | Freq: Two times a day (BID) | ORAL | Status: DC

## 2014-07-13 MED ORDER — SODIUM CHLORIDE 0.9 % IJ SOLN
INTRAMUSCULAR | Status: AC
  Filled 2014-07-13: qty 10

## 2014-07-13 MED ORDER — ACETAMINOPHEN 10 MG/ML IV SOLN
1000.0000 mg | Freq: Once | INTRAVENOUS | Status: AC
  Administered 2014-07-13: 1000 mg via INTRAVENOUS
  Filled 2014-07-13: qty 100

## 2014-07-13 MED ORDER — ENOXAPARIN SODIUM 40 MG/0.4ML ~~LOC~~ SOLN
40.0000 mg | SUBCUTANEOUS | Status: DC
  Administered 2014-07-13 – 2014-07-14 (×2): 40 mg via SUBCUTANEOUS
  Filled 2014-07-13 (×3): qty 0.4

## 2014-07-13 MED ORDER — MIDAZOLAM HCL 2 MG/2ML IJ SOLN
INTRAMUSCULAR | Status: AC
  Filled 2014-07-13: qty 2

## 2014-07-13 MED ORDER — SODIUM CHLORIDE 0.9 % IR SOLN
Status: DC | PRN
  Administered 2014-07-13: 4000 mL

## 2014-07-13 MED ORDER — ONDANSETRON HCL 4 MG/2ML IJ SOLN
INTRAMUSCULAR | Status: DC | PRN
  Administered 2014-07-13: 4 mg via INTRAVENOUS

## 2014-07-13 MED ORDER — FENTANYL CITRATE 0.05 MG/ML IJ SOLN
INTRAMUSCULAR | Status: DC | PRN
  Administered 2014-07-13 (×4): 50 ug via INTRAVENOUS

## 2014-07-13 MED ORDER — HYDROMORPHONE HCL PF 1 MG/ML IJ SOLN
INTRAMUSCULAR | Status: AC
  Administered 2014-07-13: 20:00:00
  Filled 2014-07-13: qty 1

## 2014-07-13 MED ORDER — HYDROMORPHONE HCL PF 1 MG/ML IJ SOLN
0.2500 mg | INTRAMUSCULAR | Status: DC | PRN
  Administered 2014-07-13: 0.5 mg via INTRAVENOUS
  Administered 2014-07-13 (×2): 0.25 mg via INTRAVENOUS

## 2014-07-13 MED ORDER — MIDAZOLAM HCL 5 MG/5ML IJ SOLN
INTRAMUSCULAR | Status: DC | PRN
  Administered 2014-07-13 (×2): 1 mg via INTRAVENOUS

## 2014-07-13 MED ORDER — DEXAMETHASONE SODIUM PHOSPHATE 10 MG/ML IJ SOLN
INTRAMUSCULAR | Status: AC
  Filled 2014-07-13: qty 1

## 2014-07-13 MED ORDER — LIDOCAINE HCL (CARDIAC) 20 MG/ML IV SOLN
INTRAVENOUS | Status: AC
  Filled 2014-07-13: qty 5

## 2014-07-13 MED ORDER — LIDOCAINE HCL (CARDIAC) 20 MG/ML IV SOLN
INTRAVENOUS | Status: DC | PRN
  Administered 2014-07-13: 50 mg via INTRAVENOUS

## 2014-07-13 MED ORDER — DEXAMETHASONE SODIUM PHOSPHATE 10 MG/ML IJ SOLN
INTRAMUSCULAR | Status: DC | PRN
  Administered 2014-07-13: 10 mg via INTRAVENOUS

## 2014-07-13 MED ORDER — HYDROMORPHONE HCL PF 1 MG/ML IJ SOLN
0.5000 mg | INTRAMUSCULAR | Status: DC | PRN
  Administered 2014-07-13 – 2014-07-14 (×4): 1 mg via INTRAVENOUS
  Filled 2014-07-13 (×4): qty 1

## 2014-07-13 MED ORDER — ONDANSETRON HCL 4 MG/2ML IJ SOLN
4.0000 mg | INTRAMUSCULAR | Status: DC | PRN
  Administered 2014-07-14 (×2): 4 mg via INTRAVENOUS
  Filled 2014-07-13 (×2): qty 2

## 2014-07-13 MED ORDER — PROPOFOL 10 MG/ML IV BOLUS
INTRAVENOUS | Status: AC
  Filled 2014-07-13: qty 20

## 2014-07-13 MED ORDER — DOCUSATE SODIUM 100 MG PO CAPS
100.0000 mg | ORAL_CAPSULE | Freq: Two times a day (BID) | ORAL | Status: DC
  Administered 2014-07-13 – 2014-07-14 (×3): 100 mg via ORAL
  Filled 2014-07-13 (×5): qty 1

## 2014-07-13 MED ORDER — ACETAMINOPHEN 10 MG/ML IV SOLN
1000.0000 mg | Freq: Once | INTRAVENOUS | Status: DC
Start: 2014-07-13 — End: 2014-07-13

## 2014-07-13 MED ORDER — DULOXETINE HCL 60 MG PO CPEP
60.0000 mg | ORAL_CAPSULE | Freq: Every morning | ORAL | Status: DC
  Administered 2014-07-14: 60 mg via ORAL
  Filled 2014-07-13 (×2): qty 1

## 2014-07-13 MED ORDER — LACTATED RINGERS IV SOLN: INTRAVENOUS | Status: DC

## 2014-07-13 MED ORDER — FENTANYL CITRATE 0.05 MG/ML IJ SOLN
INTRAMUSCULAR | Status: AC
  Filled 2014-07-13: qty 2

## 2014-07-13 MED ORDER — OXYCODONE-ACETAMINOPHEN 5-325 MG PO TABS: 1.0000 | ORAL_TABLET | ORAL | Status: DC | PRN

## 2014-07-13 MED ORDER — DEXTROSE-NACL 5-0.45 % IV SOLN
INTRAVENOUS | Status: DC
  Administered 2014-07-13 – 2014-07-15 (×2): via INTRAVENOUS

## 2014-07-13 MED ORDER — TOPIRAMATE 25 MG PO TABS
50.0000 mg | ORAL_TABLET | Freq: Two times a day (BID) | ORAL | Status: DC
  Administered 2014-07-13 – 2014-07-14 (×3): 50 mg via ORAL
  Filled 2014-07-13 (×5): qty 2

## 2014-07-13 MED ORDER — FENTANYL CITRATE 0.05 MG/ML IJ SOLN
50.0000 ug | Freq: Once | INTRAMUSCULAR | Status: DC
  Filled 2014-07-13: qty 2

## 2014-07-13 MED ORDER — PROMETHAZINE HCL 25 MG/ML IJ SOLN: 6.2500 mg | INTRAMUSCULAR | Status: DC | PRN

## 2014-07-13 MED ORDER — IOHEXOL 300 MG/ML  SOLN
INTRAMUSCULAR | Status: DC | PRN
  Administered 2014-07-13: 25 mL

## 2014-07-13 MED ORDER — LACTATED RINGERS IV SOLN
INTRAVENOUS | Status: DC | PRN
  Administered 2014-07-13 (×2): via INTRAVENOUS

## 2014-07-13 MED ORDER — MEPERIDINE HCL 50 MG/ML IJ SOLN: 6.2500 mg | INTRAMUSCULAR | Status: DC | PRN

## 2014-07-13 MED ORDER — GENTAMICIN SULFATE 40 MG/ML IJ SOLN
5.0000 mg/kg | INTRAMUSCULAR | Status: AC
  Administered 2014-07-13: 390 mg via INTRAVENOUS
  Filled 2014-07-13 (×2): qty 9.75

## 2014-07-13 MED ORDER — 0.9 % SODIUM CHLORIDE (POUR BTL) OPTIME
TOPICAL | Status: DC | PRN
  Administered 2014-07-13: 1000 mL

## 2014-07-13 MED ORDER — LACTATED RINGERS IV SOLN: INTRAVENOUS | Status: DC | PRN

## 2014-07-13 MED ORDER — FENTANYL CITRATE 0.05 MG/ML IJ SOLN: 25.0000 ug | INTRAMUSCULAR | Status: DC | PRN

## 2014-07-13 MED ORDER — OXYBUTYNIN CHLORIDE 5 MG PO TABS
5.0000 mg | ORAL_TABLET | Freq: Three times a day (TID) | ORAL | Status: DC | PRN
  Filled 2014-07-13: qty 1

## 2014-07-13 MED ORDER — ONDANSETRON HCL 4 MG/2ML IJ SOLN
INTRAMUSCULAR | Status: AC
  Filled 2014-07-13: qty 2

## 2014-07-13 SURGICAL SUPPLY — 21 items
ADAPTER GOLDBERG URETERAL (ADAPTER) ×2 IMPLANT
BAG URO CATCHER STRL LF (DRAPE) ×2 IMPLANT
BASKET ZERO TIP NITINOL 2.4FR (BASKET) IMPLANT
CATH FOLEY 2WAY SLVR  5CC 16FR (CATHETERS) ×1
CATH FOLEY 2WAY SLVR 5CC 16FR (CATHETERS) ×1 IMPLANT
CATH INTERMIT  6FR 70CM (CATHETERS) IMPLANT
CATH URET 5FR 28IN CONE TIP (BALLOONS)
CATH URET 5FR 70CM CONE TIP (BALLOONS) IMPLANT
DRAPE CAMERA CLOSED 9X96 (DRAPES) ×2 IMPLANT
FIBER LASER FLEXIVA 365 (UROLOGICAL SUPPLIES) ×2 IMPLANT
GLOVE BIOGEL M 8.0 STRL (GLOVE) ×2 IMPLANT
GOWN STRL REUS W/ TWL XL LVL3 (GOWN DISPOSABLE) ×1 IMPLANT
GOWN STRL REUS W/TWL XL LVL3 (GOWN DISPOSABLE) ×3 IMPLANT
GUIDEWIRE ANG ZIPWIRE 038X150 (WIRE) ×4 IMPLANT
GUIDEWIRE STR DUAL SENSOR (WIRE) ×2 IMPLANT
MANIFOLD NEPTUNE II (INSTRUMENTS) ×2 IMPLANT
PACK CYSTO (CUSTOM PROCEDURE TRAY) ×2 IMPLANT
SUT SILK 2 0 (SUTURE) ×1
SUT SILK 2-0 18XBRD TIE 12 (SUTURE) ×1 IMPLANT
TUBING CONNECTING 10 (TUBING) IMPLANT
WIRE COONS/BENSON .038X145CM (WIRE) ×2 IMPLANT

## 2014-07-13 NOTE — Progress Notes (Signed)
Paged Dr Marcell Barlow and received orders for pain management.

## 2014-07-13 NOTE — Anesthesia Procedure Notes (Signed)
Procedure Name: LMA Insertion Date/Time: 07/13/2014 5:44 PM Performed by: Ofilia Neas Pre-anesthesia Checklist: Patient identified, Emergency Drugs available, Suction available, Patient being monitored and Timeout performed Patient Re-evaluated:Patient Re-evaluated prior to inductionOxygen Delivery Method: Circle system utilized Preoxygenation: Pre-oxygenation with 100% oxygen Intubation Type: IV induction LMA: LMA inserted LMA Size: 4.0 Number of attempts: 1 Placement Confirmation: positive ETCO2 Tube secured with: Tape Dental Injury: Teeth and Oropharynx as per pre-operative assessment

## 2014-07-13 NOTE — Transfer of Care (Signed)
Immediate Anesthesia Transfer of Care Note  Patient: [name]  Procedure(s) Performed: Procedure(s): CYSTOSCOPY/RETROGRADE/RIGHT URETEROSCOPY AND REMOVAL OF RIGHT STENT (Right) HOLMIUM LASER APPLICATION (Right)  Patient Location: PACU  Anesthesia Type:General  Level of Consciousness: awake, oriented, patient cooperative, lethargic and responds to stimulation  Airway & Oxygen Therapy: Patient Spontanous Breathing and Patient connected to face mask oxygen  Post-op Assessment: Report given to PACU RN, Post -op Vital signs reviewed and stable and Patient moving all extremities  Post vital signs: Reviewed and stable  Complications: No apparent anesthesia complications

## 2014-07-13 NOTE — H&P (Signed)
H&P Chief Complaint: retained ureteral stent  History of Present Illness: [name] is a 53 y.o. year old female who is admitted at this time for endoscopic assistance with removal of a retained right ureteral stent.  She underwent radioscopic stone extraction of a right mid ureteral stone on 13 July, 2015.  Her stent was removed about a week later.  Because of persistent pain, she underwent repeat cystoscopy and stent placement by Dr. Janice Norrie on July 23. She has had some intermittent abdominal pain, and some low-grade fever.  She would like her stent out, and she presents for that routine visit today.  Past Medical History  Diagnosis Date  . Hemorrhoids   . Depression   . Anxiety   . IBS (irritable bowel syndrome)   . History of pancreatitis     2001  . History of colon polyps     TUBULAR ADENOMA  . History of kidney stones   . Migraine   . Mild intermittent asthma     NO INHALER  . Pleural fibrosis     MILD SUBPLEURAL FIBROSIS-- LAST CT 2011--  PER PULMOLOGIST NOTE DR SOOD 2011  . Medullary sponge kidney   . Hydronephrosis, right     Past Surgical History  Procedure Laterality Date  . Cesarean section  X2  . Extracorporeal shock wave lithotripsy Right 12-24-2012  . Tubal ligation  1993  . Laparoscopic cholecystectomy  [phone]  . Ercp  06/2000  . Colonoscopy w/ polypectomy  2004  &  01-16-2011  . Partial nephrectomy Bilateral LEFT  05-18-2006/   RIGHT 1998    LEFT (ANGIOMYOLIPOMA)   RIGHT (BENIGN TUMOR)  . Abdominal hysterectomy  1996  . Cystoscopy/retrograde/ureteroscopy/stone extraction with basket Right 06/08/2014    Procedure: CYSTOSCOPY RIGHT RETROGRADE/URETEROSCOPY/STONE EXTRACTION WITH BASKET WITH STENT PLACEMENT;  Surgeon: Jorja Loa, MD;  Location: [facility];  Service: Urology;  Laterality: Right;  . Holmium laser application Right 8/75/6433    Procedure:  HOLMIUM LASER APPLICATION;  Surgeon: Jorja Loa, MD;  Location:  [facility];  Service: Urology;  Laterality: Right;  . Cystoscopy with retrograde pyelogram, ureteroscopy and stent placement Right 06/18/2014    Procedure: CYSTOSCOPY WITH RETROGRADE PYELOGRAM,  STENT PLACEMENT;  Surgeon: Arvil Persons, MD;  Location: [facility];  Service: Urology;  Laterality: Right;    Home Medications:   (Not in a hospital admission)  Allergies:  Allergies  Allergen Reactions  . Penicillins Hives    Family History  Problem Relation Age of Onset  . Cancer Father     Social History:  reports that she has never smoked. She has never used smokeless tobacco. She reports that she does not drink alcohol or use illicit drugs.  ROS: A complete review of systems was performed.  All systems are negative except for pertinent findings as noted.she has had intermittent gross hematuria.  Physical Exam:  Vital signs in last 24 hours: @VSRANGES @ General:  Alert and oriented, No acute distress HEENT: Normocephalic, atraumatic Neck: No JVD or lymphadenopathy Cardiovascular: Regular rate and rhythm Lungs: Clear bilaterally Abdomen: Soft, nontender, nondistended, no abdominal masses Back: No CVA tenderness Extremities: No edema Neurologic: Grossly intact  In the office, I performed cystoscopic snaring of her stent.  I was unable to remove it totally.  She did have a fair amount of pain, which was treated with Toradol,morphine and Phenergan. Laboratory Data:  No results found for this or any previous visit (from the past 24  hour(s)). No results found for this or any previous visit (from the past 240 hour(s)). Creatinine: No results found for this basename: CREATININE,  in the last 168 hours  Radiologic Imaging: No results found.  Impression/Assessment:  Retained right ureteral stent  Plan:  Cystoscopy, right retrograde, ureteroscopic assistance of removal of a right ureteral stent.    Jorja Loa 07/13/2014, 1:53 PM  Lillette Boxer. Katalyn Matin MD

## 2014-07-13 NOTE — Op Note (Addendum)
Preoperative diagnosis: Retained ureteral stent  Postoperative diagnosis: Same, with calcified stent   Procedure: Cystoscopy, right retrograde ureteropyelogram with interpretive fluoroscopy, right ureteroscopy, holmium laser of encrusted right ureteral stent, placement of right ureteral catheter    Surgeon: Lillette Boxer. Keiyon Plack, M.D.   Anesthesia: Gen.   Complications: None  Specimen(s): Urine from right renal pelvis sent for culture  Drain(s): 16 French Foley catheter, 6 French open-ended catheter in right ureter  Indications: 53 year old female who was admitted today after attempted right double-J stent extraction the office was met with the stent not being able to be pulled out in significant patient pain. KUB revealed positioning of the stent in the distal ureter with a curl seen in the distal ureter. I was unable to remove the stent, she was admitted for ureteroscopic stent removal. I have discussed with the patient and her husband the fact that she may have a ureteral injury, and she may eventually need a percutaneous nephrostomy tube placement. She is aware of the risks and complications of this procedure. She has been covered with gentamicin for the procedure.    Technique and findings: The patient was properly identified in the holding area. She received preoperative IV gentamicin. Surgical site was correctly marked. She was taken the operating room where general anesthetic was administered with the LMA. She was placed in the dorsolithotomy position. Genitalia and perineum were prepped and draped. Proper timeout was performed.  A 22 French panendoscope was advanced into her bladder. Circumferential inspection of the bladder revealed the urothelium be normal. The stent was seen protruding from the right ureteral orifice, which revealed surrounding bolus edema. No bladder lesions or stones were noted. I then passed a 6 [place] open-ended catheter through the cystoscope. This was advanced  into the distal ureter. I then performed a retrograde ureteropyelogram. This showed a normal distal ureter. There was contrast which passed the curled end of the double-J stent. This revealed a normal proximal ureter, without evidence of hydronephrosis. There was blunting of the pyelo-calyceal system. I was unable to negotiate the guidewire through the open-ended catheter via the double-J stent. I tried several times and was unsuccessful with a sensor-tip guidewire. Eventually, I was able to navigate a glide wire passed the stent, up into the right renal pelvis. I attempted to pass a 6 [place] open-ended catheter over top of the Glidewire to verify adequate positioning within the ureter. This was unsuccessful, however. A retrograde was performed, and no extravasation was noted anywhere along the ureter. I was eventually able to pass the open-ended catheter approximately 1 cm proximal. I verify position with a hydronephrotic drip of urine. I then removed the Glidewire passed a sensor-tip guidewire through the open-ended catheter, positioning was seen Laurus topically with a curl in the renal pelvis. The cystoscope was then removed. I passed a 6 [place] short rigid ureteroscope along the stent. It was obvious that there were many encrustations along the stent. I used the 365  laser fiber and holmium energy to remove the encrustations from the stent using low power on the laser. Once the laser energy remove most of the encrustations, gentle traction on the stent easily remove the stent from the patient's ureter.  At this point, I passed the ureteroscope proximally. I was unable to see any significant urothelial lesions along the ureter. There was a spasm in the distal ureter where the curl of the double-J stent was. Another retrograde ureteropyelogram failed to show any significant extravasation. A guidewire was present during this procedure.  I felt it was judicious to leave a ureteral catheter in the patient's ureter  overnight, as she may have spasm and pain. The 6 [place] open-ended catheter was passed over the guidewire into the right renal pelvis. The guidewire was removed. Positioning was verified using fluoroscopy of the proximal end of this open-ended catheter. I then passed a 50 French Foley catheter and filled the balloon      with 10 cc of water.  using the [facility] catheter adapter, I drained the ureteral catheter alongside the Foley catheter into a bedside bag. The open-ended catheter was fastened to the Foley catheter with 2-0 silk ties.  The patient tolerated the procedure well. She was awakened and taken to the PACU in stable condition.  It should be noted that a urine sample through the open-ended catheter was sent for urine culture. This was taken from the right renal pelvis.

## 2014-07-13 NOTE — Anesthesia Preprocedure Evaluation (Signed)
Anesthesia Evaluation  Patient identified by MRN, date of birth, ID band Patient awake    Reviewed: Allergy & Precautions, H&P , NPO status , Patient's Chart, lab work & pertinent test results  Airway Mallampati: II TM Distance: >3 FB Neck ROM: Full    Dental no notable dental hx.    Pulmonary asthma ,  MILD pulmonary FIBROSIS breath sounds clear to auscultation  Pulmonary exam normal       Cardiovascular negative cardio ROS  Rhythm:Regular Rate:Normal     Neuro/Psych negative neurological ROS  negative psych ROS   GI/Hepatic negative GI ROS, Neg liver ROS,   Endo/Other  negative endocrine ROS  Renal/GU negative Renal ROS  negative genitourinary   Musculoskeletal negative musculoskeletal ROS (+)   Abdominal   Peds negative pediatric ROS (+)  Hematology negative hematology ROS (+)   Anesthesia Other Findings   Reproductive/Obstetrics negative OB ROS                           Anesthesia Physical  Anesthesia Plan  ASA: II  Anesthesia Plan: General   Post-op Pain Management:    Induction: Intravenous  Airway Management Planned: LMA  Additional Equipment:   Intra-op Plan:   Post-operative Plan: Extubation in OR  Informed Consent: I have reviewed the patients History and Physical, chart, labs and discussed the procedure including the risks, benefits and alternatives for the proposed anesthesia with the patient or authorized representative who has indicated his/her understanding and acceptance.   Dental advisory given  Plan Discussed with: CRNA  Anesthesia Plan Comments:         Anesthesia Quick Evaluation

## 2014-07-14 ENCOUNTER — Encounter (HOSPITAL_COMMUNITY): Payer: Self-pay | Admitting: Urology

## 2014-07-14 DIAGNOSIS — N1330 Unspecified hydronephrosis: Secondary | ICD-10-CM | POA: Diagnosis not present

## 2014-07-14 LAB — COMPREHENSIVE METABOLIC PANEL
ALK PHOS: 139 U/L — AB (ref 39–117)
ALT: 22 U/L (ref 0–35)
ANION GAP: 11 (ref 5–15)
AST: 22 U/L (ref 0–37)
Albumin: 2.9 g/dL — ABNORMAL LOW (ref 3.5–5.2)
BILIRUBIN TOTAL: 0.3 mg/dL (ref 0.3–1.2)
BUN: 13 mg/dL (ref 6–23)
CHLORIDE: 106 meq/L (ref 96–112)
CO2: 23 mEq/L (ref 19–32)
Calcium: 8.8 mg/dL (ref 8.4–10.5)
Creatinine, Ser: 0.9 mg/dL (ref 0.50–1.10)
GFR calc Af Amer: 84 mL/min — ABNORMAL LOW (ref >90)
GFR calc non Af Amer: 72 mL/min — ABNORMAL LOW (ref >90)
Glucose, Bld: 189 mg/dL — ABNORMAL HIGH (ref 70–99)
POTASSIUM: 4.3 meq/L (ref 3.7–5.3)
Sodium: 140 mEq/L (ref 137–147)
TOTAL PROTEIN: 6.1 g/dL (ref 6.0–8.3)

## 2014-07-14 LAB — LIPASE, BLOOD: Lipase: 177 U/L — ABNORMAL HIGH (ref 11–59)

## 2014-07-14 NOTE — Progress Notes (Signed)
Inpatient Diabetes Program Recommendations  AACE/ADA: New Consensus Statement on Inpatient Glycemic Control (2013)  Target Ranges:  Prepandial:   less than 140 mg/dL      Peak postprandial:   less than 180 mg/dL (1-2 hours)      Critically ill patients:  140 - 180 mg/dL   Reason for Assessment: Hyperglycemia  Diabetes history: None noted Outpatient Diabetes medications: None Current orders for Inpatient glycemic control: None  Results for [name], [name] (MRN [id number]) as of 07/14/2014 10:24  Ref. Range 07/14/2014 04:45  Glucose Latest Range: 70-99 mg/dL 189 (H)   Note:  Request MD complete Glycemic Control Order Set and consider ordering CBG's and correction since glucose elevated.  Also request MD consider ordering an A1C to assess glycemic control prior to admission.  Thank you.  Makari Portman S. Marcelline Mates, RN, CNS, CDE Inpatient Diabetes Program, team pager [phone]

## 2014-07-14 NOTE — Progress Notes (Signed)
1 Day Post-Op Subjective: Patient reports pain is better.                  No n/v. She ate last pm  Objective: Vital signs in last 24 hours: Temp:  [97.6 F (36.4 C)-98.2 F (36.8 C)] 98.2 F (36.8 C) (08/18 0510) Pulse Rate:  [55-81] 57 (08/18 0510) Resp:  [11-18] 16 (08/18 0510) BP: (98-165)/(58-89) 98/58 mmHg (08/18 0510) SpO2:  [95 %-100 %] 98 % (08/18 0510) Weight:  [77.021 kg (169 lb 12.8 oz)] 77.021 kg (169 lb 12.8 oz) (08/17 1415)  Intake/Output from previous day: 08/17 0701 - 08/18 0700 In: 1350 [I.V.:1350] Out: 1150 [Urine:1150] Intake/Output this shift: Total I/O In: 360 [P.O.:360] Out: -   Physical Exam:  Constitutional: Vital signs reviewed. WD WN in NAD   Eyes: PERRL, No scleral icterus.   Pulmonary/Chest: Normal effort   Urine slightly bloody Lab Results: No results found for this basename: HGB, HCT,  in the last 72 hours BMET  Recent Labs  07/13/14 1445 07/14/14 0445  NA  --  140  K  --  4.3  CL  --  106  CO2  --  23  GLUCOSE  --  189*  BUN  --  13  CREATININE 0.97 0.90  CALCIUM  --  8.8   No results found for this basename: LABPT, INR,  in the last 72 hours No results found for this basename: LABURIN,  in the last 72 hours Results for orders placed during the hospital encounter of 12/18/12  URINE CULTURE     Status: None   Collection Time    12/18/12  8:00 PM      Result Value Ref Range Status   Specimen Description URINE, CLEAN CATCH   Final   Special Requests NONE   Final   Culture  Setup Time 12/19/2012 16:36   Final   Colony Count 20,OOO COLONIES/ML   Final   Culture     Final   Value: Multiple bacterial morphotypes present, none predominant. Suggest appropriate recollection if clinically indicated.   Report Status 12/20/2012 FINAL   Final    Studies/Results: No results found. Lipase is up slightly today Assessment/Plan:   1.  Right hydronephrosis secondary to an obstructed stent, status post stent removal yesterday using  cystoscopic and ureteroscopic assistance.  She is doing better today, with less pain  2.  Elevated lipase level-this could be secondary to her most recent renal issue.  I have spoken with a gastroenterologist who thinks that this could be followed as an outpatient if she is tolerating a diet  Plan:  I will see how she does with the Foley catheter and ureteral catheter removed today.  If she is feeling better with less pain, and has no fever, I will let her go home with close follow-up.  I will contact her later today.      LOS: 1 day   Jorja Loa 07/14/2014, 8:31 AM

## 2014-07-15 DIAGNOSIS — N1330 Unspecified hydronephrosis: Secondary | ICD-10-CM | POA: Diagnosis not present

## 2014-07-15 LAB — URINE CULTURE
CULTURE: NO GROWTH
Colony Count: NO GROWTH

## 2014-07-15 MED ORDER — OXYCODONE-ACETAMINOPHEN 5-325 MG PO TABS: 1.0000 | ORAL_TABLET | ORAL | Status: DC | PRN

## 2014-07-15 NOTE — Discharge Instructions (Signed)

## 2014-07-15 NOTE — Discharge Summary (Signed)
  Patient ID: JURNEI LATINI MRN: [id number] DOB/AGE: 53-May-1962 53 y.o.  Admit date: 07/13/2014 Discharge date: 07/15/2014  Primary Care Physician:  Wyatt Haste, MD  Discharge Diagnoses:   Present on Admission:  . Calculus of kidney  Consults:  None   Discharge Medications:   Medication List         ciprofloxacin 500 MG tablet  Commonly known as:  CIPRO  Take 0.5 tablets (250 mg total) by mouth 2 (two) times daily.     DULoxetine 60 MG capsule  Commonly known as:  CYMBALTA  Take 60 mg by mouth every morning.     estradiol 2 MG tablet  Commonly known as:  ESTRACE  Take 2 mg by mouth daily.     oxyCODONE-acetaminophen 5-325 MG per tablet  Commonly known as:  ROXICET  Take 1-2 tablets by mouth every 4 (four) hours as needed for severe pain.     topiramate 50 MG tablet  Commonly known as:  TOPAMAX  Take 50 mg by mouth 2 (two) times daily.         Significant Diagnostic Studies:  No results found.  Brief H and P: For complete details please refer to admission H and P, but in brief the patient was admitted for recurrent flank pain and a retained ureteral stent. Attempted stent extraction the office was unsuccessful, as it was calcified and would not easily be removed.  Hospital Course:  Active Problems:   Calculus of kidney   Day of Discharge BP 118/58  Pulse 59  Temp(Src) 98 F (36.7 C) (Oral)  Resp 20  Ht 5' 5.5" (1.664 m)  Wt 77.021 kg (169 lb 12.8 oz)  BMI 27.82 kg/m2  SpO2 98%  No results found for this or any previous visit (from the past 24 hour(s)).  Physical Exam: General: Alert and awake oriented x3 not in any acute distress. HEENT: anicteric sclera, pupils reactive to light and accommodation CVS: S1-S2 clear no murmur rubs or gallops Chest: clear to auscultation bilaterally, no wheezing rales or rhonchi Abdomen: soft nontender, nondistended, normal bowel sounds, no organomegaly Extremities: no cyanosis, clubbing or edema noted  bilaterally Neuro: Cranial nerves II-XII intact, no focal neurological deficits  Disposition:  Home  Diet:  No restrictions  Activity:  Gradually increase. She was authorized to return to work on September 27   Disposition and Follow-up:     Discharge Instructions   Discharge patient    Complete by:  As directed               TESTS THAT NEED FOLLOW-UP  Urine culture  DISCHARGE FOLLOW-UP Follow-up Information   Follow up with Jorja Loa, MD. (We will call you)    Specialty:  Urology   Contact information:   [place] [address] [phone]       Time spent on Discharge:  15 minutes  Signed: Jorja Loa 07/15/2014, 7:04 AM

## 2014-07-16 NOTE — Anesthesia Postprocedure Evaluation (Signed)
  Anesthesia Post-op Note  Patient: [name]  Procedure(s) Performed: Procedure(s) (LRB): CYSTOSCOPY/RETROGRADE/RIGHT URETEROSCOPY AND REMOVAL OF RIGHT STENT (Right) HOLMIUM LASER APPLICATION (Right)  Patient Location: PACU  Anesthesia Type: General  Level of Consciousness: awake and alert   Airway and Oxygen Therapy: Patient Spontanous Breathing  Post-op Pain: mild  Post-op Assessment: Post-op Vital signs reviewed, Patient's Cardiovascular Status Stable, Respiratory Function Stable, Patent Airway and No signs of Nausea or vomiting  Last Vitals:  Filed Vitals:   07/15/14 0554  BP: 118/58  Pulse: 59  Temp: 36.7 C  Resp: 20    Post-op Vital Signs: stable   Complications: No apparent anesthesia complications

## 2014-08-27 ENCOUNTER — Encounter: Payer: Self-pay | Admitting: Family Medicine

## 2014-08-27 ENCOUNTER — Ambulatory Visit (INDEPENDENT_AMBULATORY_CARE_PROVIDER_SITE_OTHER): Payer: 59 | Admitting: Family Medicine

## 2014-08-27 VITALS — BP 140/92 | HR 72 | Temp 98.0°F | Ht 66.0 in | Wt 170.0 lb

## 2014-08-27 DIAGNOSIS — J069 Acute upper respiratory infection, unspecified: Secondary | ICD-10-CM

## 2014-08-27 DIAGNOSIS — J45909 Unspecified asthma, uncomplicated: Secondary | ICD-10-CM

## 2014-08-27 DIAGNOSIS — J0100 Acute maxillary sinusitis, unspecified: Secondary | ICD-10-CM

## 2014-08-27 DIAGNOSIS — R0602 Shortness of breath: Secondary | ICD-10-CM

## 2014-08-27 MED ORDER — ALBUTEROL SULFATE HFA 108 (90 BASE) MCG/ACT IN AERS: 2.0000 | INHALATION_SPRAY | Freq: Four times a day (QID) | RESPIRATORY_TRACT | Status: DC | PRN

## 2014-08-27 MED ORDER — AZITHROMYCIN 250 MG PO TABS: ORAL_TABLET | ORAL | Status: DC

## 2014-08-27 NOTE — Progress Notes (Signed)
Chief Complaint  Patient presents with  . Cough    started with ST this past 'Sunday with ST, cough, nasal congestion. Mucus that she coughs up is green with a touch of blood. Nasal mucus is clear when she blows her nose. Feel like she is having trouble breathing, "like the air is thick." Has been running temps around 101, mostly at night.    Four days ago she started with sore throat, sneezing, coughing, ear pain (bilateral). It has gotten worse--has developed fevers (101).  She had some sinus pain initially, but now her throat is worse and her breathing is worse.  She has h/o RAD and pulmonary fibrosis, but no baseline breathing problems.  She does not have an inhaler (but has used them in the past.). Feels like the air is thick, but no true shortness of breath.  Nasal mucus is clear.  Phlegm is yellow, with some blood streak intermittently.  She has been using ibuprofen for pain and fever (took today). Took Mucinex-D and Tylenol cold (had been using both; today took only tylenol cold). She had some leftover Tussionex that she finished, to help with cough (helped).  Denies sick contacts  Past Medical History  Diagnosis Date  . Hemorrhoids   . Depression   . Anxiety   . IBS (irritable bowel syndrome)   . History of pancreatitis     20' 01  . History of colon polyps     TUBULAR ADENOMA  . History of kidney stones   . Migraine   . Mild intermittent asthma     NO INHALER  . Pleural fibrosis     MILD SUBPLEURAL FIBROSIS-- LAST CT 2011--  PER PULMOLOGIST NOTE DR SOOD 2011  . Medullary sponge kidney   . Hydronephrosis, right    Past Surgical History  Procedure Laterality Date  . Cesarean section  X2  . Extracorporeal shock wave lithotripsy Right 12-24-2012  . Tubal ligation  1993  . Laparoscopic cholecystectomy  [phone]  . Ercp  06/2000  . Colonoscopy w/ polypectomy  2004  &  01-16-2011  . Partial nephrectomy Bilateral LEFT  05-18-2006/   RIGHT 1998    LEFT (ANGIOMYOLIPOMA)    RIGHT (BENIGN TUMOR)  . Abdominal hysterectomy  1996  . Cystoscopy/retrograde/ureteroscopy/stone extraction with basket Right 06/08/2014    Procedure: CYSTOSCOPY RIGHT RETROGRADE/URETEROSCOPY/STONE EXTRACTION WITH BASKET WITH STENT PLACEMENT;  Surgeon: Jorja Loa, MD;  Location[facility];  Service: Urology;  Laterality: Right;  . Holmium laser application Right 3/95/3202    Procedure:  HOLMIUM LASER APPLICATION;  Surgeon: Jorja Loa, MD;  Location: [facility];  Service: Urology;  Laterality: Right;  . Cystoscopy with retrograde pyelogram, ureteroscopy and stent placement Right 06/18/2014    Procedure: CYSTOSCOPY WITH RETROGRADE PYELOGRAM,  STENT PLACEMENT;  Surgeon: Arvil Persons, MD;  Location: [facility];  Service: Urology;  Laterality: Right;  . Cystoscopy/retrograde/ureteroscopy Right 07/13/2014    Procedure: CYSTOSCOPY/RETROGRADE/RIGHT URETEROSCOPY AND REMOVAL OF RIGHT STENT;  Surgeon: Jorja Loa, MD;  Location: [facility];  Service: Urology;  Laterality: Right;  . Holmium laser application Right 3/34/3568    Procedure: HOLMIUM LASER APPLICATION;  Surgeon: Jorja Loa, MD;  Location: [facility];  Service: Urology;  Laterality: Right;   History   Social History  . Marital Status: Married    Spouse Name: N/A    Number of Children: N/A  . Years of Education: N/A   Occupational History  . Not on file.  Social History Main Topics  . Smoking status: Never Smoker   . Smokeless tobacco: Never Used  . Alcohol Use: No  . Drug Use: No  . Sexual Activity: Not on file   Other Topics Concern  . Not on file   Social History Narrative  . No narrative on file   Outpatient Encounter Prescriptions as of 08/27/2014  Medication Sig Note  . DULoxetine (CYMBALTA) 60 MG capsule Take 60 mg by mouth every morning.    Marland Kitchen estradiol (ESTRACE) 2 MG tablet Take 2 mg by mouth daily.   Marland Kitchen ibuprofen (ADVIL,MOTRIN) 200 MG tablet Take  600 mg by mouth every 6 (six) hours as needed.   . pseudoephedrine-guaifenesin (MUCINEX D) 60-600 MG per tablet Take 1 tablet by mouth every 12 (twelve) hours.   . topiramate (TOPAMAX) 50 MG tablet Take 50 mg by mouth 2 (two) times daily.   . chlorpheniramine-HYDROcodone (TUSSIONEX) 10-8 MG/5ML LQCR Take 5 mLs by mouth. 08/27/2014: Used it the last few nights; ran out  . Phenylephrine-DM-GG-APAP (TYLENOL COLD MULTI-SYMPTOM) 5-10-200-325 MG TABS Take 2 each by mouth as needed. 08/27/2014: Last dose last night  . [DISCONTINUED] ciprofloxacin (CIPRO) 500 MG tablet Take 0.5 tablets (250 mg total) by mouth 2 (two) times daily.   . [DISCONTINUED] oxyCODONE-acetaminophen (ROXICET) 5-325 MG per tablet Take 1-2 tablets by mouth every 4 (four) hours as needed for severe pain.    Allergies  Allergen Reactions  . Penicillins Hives   ROS: +fever.  No chills.  Denies nausea, vomiting, diarrhea, urinary complaints, bleeding/bruising, rashes. No headache, chest pain, joint pains or other concerns except as noted in HPI.  PHYSICAL EXAM: BP 140/92  Pulse 72  Temp(Src) 98 F (36.7 C) (Tympanic)  Ht '5\' 6"'  (1.676 m)  Wt 170 lb (77.111 kg)  BMI 27.45 kg/m2  Well developed, mildly ill appearing female (tired) in no distress.  No coughing. HEENT:  PERRL, EOMI, conjunctiva clear. Nasal mucosa is mildly edematous, no erythema, clear mucus. Sinuses are mildly tender over maxillary sinuses. OP is clear without erythema, exudate or lesions. TM's and EAC's are normal bilaterally, just slightly retracted on the left. Neck: she is tender bilaterally over mildly enlarged lymph nodes, as well as over tonsillar area. Heart: regular rate and rhythm, no murmur Lungs: clear bilaterally.  Good air movement.  No wheezes, rales, ronchi Extremities: no edema Skin: no rashes Neuro: alert and oriented.  Cranial nerves intact. Normal strength, gait Psych: normal mood, affect  ASSESSMENT/PLAN:  Acute upper respiratory  infection  Reactive airway disease, unspecified asthma severity, uncomplicated - Plan: albuterol (PROVENTIL HFA;VENTOLIN HFA) 108 (90 BASE) MCG/ACT inhaler  Shortness of breath - Plan: albuterol (PROVENTIL HFA;VENTOLIN HFA) 108 (90 BASE) MCG/ACT inhaler  Acute maxillary sinusitis, recurrence not specified - Plan: azithromycin (ZITHROMAX) 250 MG tablet   Drink plenty of fluids. Stop taking the Mucinex-D (it is making you nauseated, and is providing too much decongestant when also taken with tylenol cold). Instead, take PLAIN guaifenesin (plain robitussin, making sure not to overlap ingredients with your tylenol cold medication--check with the pharmacist). Try steamy showers. Use the albuterol inhaler if/when you feel tight in the chest/shortness of breath/wheezing.  Start the antibiotics in the next few days if your fevers and discolored mucus persist.  If you start them, you have to finish the whole course.  Consider using sinus rinses (sinus rinse kit or Neti-pot)

## 2014-08-27 NOTE — Patient Instructions (Addendum)
  Drink plenty of fluids. Stop taking the Mucinex-D (it is making you nauseated, and is providing too much decongestant when also taken with tylenol cold). Instead, take PLAIN guaifenesin (plain robitussin, making sure not to overlap ingredients with your tylenol cold medication--check with the pharmacist). Try steamy showers. Use the albuterol inhaler if/when you feel tight in the chest/shortness of breath/wheezing.  Start the antibiotics in the next few days if your fevers and discolored mucus persist.  If you start them, you have to finish the whole course. If you are not 100% better by day 10 (but you have gotten significantly better, indicating that the antibiotics are working), then call for a refill, to start on day #11.  Consider using sinus rinses (sinus rinse kit or Neti-pot)

## 2015-01-04 ENCOUNTER — Ambulatory Visit: Payer: Self-pay | Admitting: Family Medicine

## 2015-05-19 ENCOUNTER — Encounter: Payer: Self-pay | Admitting: Family Medicine

## 2015-05-20 ENCOUNTER — Encounter: Payer: Self-pay | Admitting: Family Medicine

## 2015-12-24 ENCOUNTER — Encounter: Payer: Self-pay | Admitting: Family Medicine

## 2016-01-11 ENCOUNTER — Ambulatory Visit (INDEPENDENT_AMBULATORY_CARE_PROVIDER_SITE_OTHER): Payer: BLUE CROSS/BLUE SHIELD | Admitting: Family Medicine

## 2016-01-11 ENCOUNTER — Encounter: Payer: Self-pay | Admitting: Family Medicine

## 2016-01-11 ENCOUNTER — Ambulatory Visit
Admission: RE | Admit: 2016-01-11 | Discharge: 2016-01-11 | Disposition: A | Discharge status: Home / Self Care | Payer: BLUE CROSS/BLUE SHIELD | Source: Ambulatory Visit | Attending: Family Medicine | Admitting: Family Medicine

## 2016-01-11 VITALS — BP 136/90 | HR 70 | Temp 98.7°F | Wt 190.0 lb

## 2016-01-11 DIAGNOSIS — R05 Cough: Secondary | ICD-10-CM

## 2016-01-11 DIAGNOSIS — R5383 Other fatigue: Secondary | ICD-10-CM

## 2016-01-11 DIAGNOSIS — R059 Cough, unspecified: Secondary | ICD-10-CM

## 2016-01-11 MED ORDER — HYDROCOD POLST-CPM POLST ER 10-8 MG/5ML PO SUER: 5.0000 mL | Freq: Two times a day (BID) | ORAL | Status: DC | PRN

## 2016-01-11 NOTE — Patient Instructions (Signed)
Use the Tussionex as needed and switch to Aleve. Take 2 of those 2 or 3 times per day and you can also take Tylenol

## 2016-01-11 NOTE — Progress Notes (Signed)
Left vm that chest x-ray: Let her know that the chest x-ray is negative. Recommend supportive care.

## 2016-01-11 NOTE — Progress Notes (Signed)
   Subjective:    Patient ID: [name], female    DOB: 05-01-1961, 55 y.o.   MRN: [id number]  HPI She complains of a three-day history this started with coughing followed by malaise, slight nausea and apparently a temperature up to 104 yesterday. She continues with difficulty with myalgias, headache slight chest discomfort. No sore throat or earache. She has been taking Coricidin and has used ibuprofen.   Review of Systems     Objective:   Physical Exam Alert and in no distress. Tympanic membranes and canals are normal. Pharyngeal area is normal. Neck is supple without adenopathy or thyromegaly. Cardiac exam shows a regular sinus rhythm without murmurs or gallops. Lungs are clear to auscultation. Chest x-ray is negative       Assessment & Plan:  Cough - Plan: DG Chest 2 View, chlorpheniramine-HYDROcodone (TUSSIONEX PENNKINETIC ER) 10-8 MG/5ML SUER  Other fatigue - Plan: DG Chest 2 View, chlorpheniramine-HYDROcodone (TUSSIONEX PENNKINETIC ER) 10-8 MG/5ML SUER I will treat her cough and explained to her that this is most likely a viral illness. Also recommend continuing on ibuprofen.

## 2016-02-22 ENCOUNTER — Encounter: Payer: Self-pay | Admitting: Family Medicine

## 2016-02-22 ENCOUNTER — Ambulatory Visit (INDEPENDENT_AMBULATORY_CARE_PROVIDER_SITE_OTHER): Payer: BLUE CROSS/BLUE SHIELD | Admitting: Family Medicine

## 2016-02-22 VITALS — BP 152/94 | HR 86 | Resp 14 | Ht 66.0 in | Wt 188.8 lb

## 2016-02-22 DIAGNOSIS — M1990 Unspecified osteoarthritis, unspecified site: Secondary | ICD-10-CM

## 2016-02-22 DIAGNOSIS — M05741 Rheumatoid arthritis with rheumatoid factor of right hand without organ or systems involvement: Secondary | ICD-10-CM

## 2016-02-22 DIAGNOSIS — M05742 Rheumatoid arthritis with rheumatoid factor of left hand without organ or systems involvement: Secondary | ICD-10-CM

## 2016-02-22 NOTE — Progress Notes (Signed)
   Subjective:    Patient ID: [name], female    DOB: 05-20-1961, 55 y.o.   MRN: [id number]  HPI He is here for evaluation of joint pain. She states that 3 weeks ago she noted the onset of joint pain and swelling. She states that all of her joints including shoulders, elbows, wrists, knees and ankles as well as neck are painful. She has had no difficulty with fever, chills, recent illnesses, rash. She is on no new medications.as been taking Advil and Aleve with minimal benefit.   Review of Systems     Objective:   Physical Exam Alert and complaining of pain. Exam of both wrist does show some swelling in the metacarpal area as well as the second MCP joint. No swelling of the elbows, knees or ankles or toes. No rashes noted.      Assessment & Plan:  Inflammatory arthritis (Early) - Plan: Rheumatoid Arthritis Diagnostic Panel, ANA, Sedimentation rate there is definitely a bilateral nature to this with some swelling of the joints. We'll most likely refer to rheumatology.

## 2016-02-22 NOTE — Patient Instructions (Signed)
4 ibuprofen 3 times per day and 2 Tylenol 4 times per day

## 2016-02-23 LAB — RHEUMATOID ARTHRITIS DIAGNOSTIC PANEL
Cyclic Citrullin Peptide Ab: 131 Units — ABNORMAL HIGH
RHEUMATOID FACTOR: 16 [IU]/mL — AB (ref <=14)

## 2016-02-23 LAB — ANA: Anti Nuclear Antibody(ANA): POSITIVE — AB

## 2016-02-23 LAB — SEDIMENTATION RATE: Sed Rate: 9 mm/hr (ref 0–30)

## 2016-02-23 LAB — ANTI-NUCLEAR AB-TITER (ANA TITER): ANA Titer 1: 1:160 {titer} — ABNORMAL HIGH

## 2016-02-24 NOTE — Progress Notes (Addendum)
   Subjective:    Patient ID: [name], female    DOB: [birth year], 55 y.o.   MRN: [id number]  HPI Her blood work did come back indicating rheumatoid arthritis. She is doing better on her present dosing of ibuprofen. She states that she has never had any arthritis symptoms in the past. I will also order a CRP and refer to rheumatology.   Review of Systems     Objective:   Physical Exam        Assessment & Plan:  Inflammatory arthritis ([place]) - Plan: Rheumatoid Arthritis Diagnostic Panel, ANA, Sedimentation rate  Rheumatoid arthritis involving both hands with positive rheumatoid factor ([place])

## 2016-02-24 NOTE — Addendum Note (Signed)
Addended by: Denita Lung on: 02/24/2016 09:03 AM   Modules accepted: Orders

## 2016-02-25 ENCOUNTER — Telehealth: Payer: Self-pay | Admitting: Family Medicine

## 2016-02-25 ENCOUNTER — Encounter: Payer: Self-pay | Admitting: Family Medicine

## 2016-02-25 LAB — C-REACTIVE PROTEIN: CRP: 1.8 mg/dL — ABNORMAL HIGH (ref <0.60)

## 2016-02-25 MED ORDER — DICLOFENAC SODIUM 75 MG PO TBEC: 75.0000 mg | DELAYED_RELEASE_TABLET | Freq: Two times a day (BID) | ORAL | Status: DC

## 2016-02-25 NOTE — Telephone Encounter (Signed)
Pt called and states that is still in a lot of pain and was wondering if you would send her something in for the pain,says the aleve is not helping, pt uses [id number] - [place], [name] and pt can be reached at [phone]

## 2016-03-08 ENCOUNTER — Encounter: Payer: Self-pay | Admitting: Family Medicine

## 2016-03-08 ENCOUNTER — Ambulatory Visit (INDEPENDENT_AMBULATORY_CARE_PROVIDER_SITE_OTHER): Payer: Commercial Managed Care - HMO | Admitting: Family Medicine

## 2016-03-08 ENCOUNTER — Telehealth: Payer: Self-pay

## 2016-03-08 VITALS — BP 144/100 | HR 66 | Wt 190.0 lb

## 2016-03-08 DIAGNOSIS — M05742 Rheumatoid arthritis with rheumatoid factor of left hand without organ or systems involvement: Secondary | ICD-10-CM

## 2016-03-08 DIAGNOSIS — M05741 Rheumatoid arthritis with rheumatoid factor of right hand without organ or systems involvement: Secondary | ICD-10-CM

## 2016-03-08 MED ORDER — METHYLPREDNISOLONE ACETATE 80 MG/ML IJ SUSP
80.0000 mg | Freq: Once | INTRAMUSCULAR | Status: AC
  Administered 2016-03-08: 80 mg via INTRAMUSCULAR

## 2016-03-08 NOTE — Telephone Encounter (Signed)
Return to work note

## 2016-03-08 NOTE — Patient Instructions (Signed)
Call me next week and let me know how you're doing on the shot. Let me know the name of the doctor.

## 2016-03-08 NOTE — Progress Notes (Signed)
   Subjective:    Patient ID: [name], female    DOB: January 11, 1961, 55 y.o.   MRN: [id number]  HPI She is here for a consult. She continues to complain of pain. She does have an appointment set up with rheumatology on the 25th. She has not had any success with several NSAIDs. Also tried tramadol with not much success.  Review of Systems     Objective:   Physical Exam Alert and in no distress otherwise not examined       Assessment & Plan:  Rheumatoid arthritis involving both hands with positive rheumatoid factor ([place]) I explained that using codeine would not be a good alternative especially allowing her to work with this. I will give her an injection of Depo-Medrol 80 mg. She is to call me next week and let me now how she is doing. She will also let me know who the rheumatologist is as it is not documented in the chart.

## 2016-03-09 ENCOUNTER — Telehealth: Payer: Self-pay | Admitting: Family Medicine

## 2016-03-09 ENCOUNTER — Other Ambulatory Visit: Payer: Self-pay | Admitting: Family Medicine

## 2016-03-09 MED ORDER — HYDROCODONE-ACETAMINOPHEN 5-325 MG PO TABS: 1.0000 | ORAL_TABLET | Freq: Four times a day (QID) | ORAL | Status: DC | PRN

## 2016-03-09 NOTE — Telephone Encounter (Signed)
Pt informed she will come by on her way to work

## 2016-03-09 NOTE — Telephone Encounter (Signed)
Let her know that I cannot get a hold the rheumatologist as he is close for the rest of the week. Have her come by and pick up a prescription of codeine and check with [place] at the beginning of the week

## 2016-03-09 NOTE — Telephone Encounter (Signed)
Pt states shot didn't help very much at all, barely.  Said you told her if it didn't help that you would call her in something for pain & she would like something sent in. Also the name of the Rheumatologist is Hurley Cisco.

## 2016-03-13 ENCOUNTER — Encounter: Payer: Self-pay | Admitting: Family Medicine

## 2016-03-13 ENCOUNTER — Ambulatory Visit (INDEPENDENT_AMBULATORY_CARE_PROVIDER_SITE_OTHER): Payer: Commercial Managed Care - HMO | Admitting: Family Medicine

## 2016-03-13 VITALS — BP 150/100 | HR 64 | Ht 66.0 in | Wt 189.4 lb

## 2016-03-13 DIAGNOSIS — I159 Secondary hypertension, unspecified: Secondary | ICD-10-CM | POA: Diagnosis not present

## 2016-03-13 DIAGNOSIS — M05741 Rheumatoid arthritis with rheumatoid factor of right hand without organ or systems involvement: Secondary | ICD-10-CM | POA: Diagnosis not present

## 2016-03-13 DIAGNOSIS — M05742 Rheumatoid arthritis with rheumatoid factor of left hand without organ or systems involvement: Secondary | ICD-10-CM

## 2016-03-13 LAB — CBC WITH DIFFERENTIAL/PLATELET
BASOS PCT: 1 %
Basophils Absolute: 89 cells/uL (ref 0–200)
EOS ABS: 178 {cells}/uL (ref 15–500)
Eosinophils Relative: 2 %
HCT: 37.5 % (ref 35.0–45.0)
Hemoglobin: 12.3 g/dL (ref 11.7–15.5)
LYMPHS PCT: 22 %
Lymphs Abs: 1958 cells/uL (ref 850–3900)
MCH: 29.2 pg (ref 27.0–33.0)
MCHC: 32.8 g/dL (ref 32.0–36.0)
MCV: 89.1 fL (ref 80.0–100.0)
MONOS PCT: 5 %
MPV: 11 fL (ref 7.5–12.5)
Monocytes Absolute: 445 cells/uL (ref 200–950)
Neutro Abs: 6230 cells/uL (ref 1500–7800)
Neutrophils Relative %: 70 %
PLATELETS: 269 10*3/uL (ref 140–400)
RBC: 4.21 MIL/uL (ref 3.80–5.10)
RDW: 13.7 % (ref 11.0–15.0)
WBC: 8.9 10*3/uL (ref 4.0–10.5)

## 2016-03-13 LAB — LIPID PANEL
CHOL/HDL RATIO: 2.5 ratio (ref <=5.0)
Cholesterol: 193 mg/dL (ref 125–200)
HDL: 76 mg/dL (ref >=46)
LDL Cholesterol: 95 mg/dL (ref <130)
Triglycerides: 110 mg/dL (ref <150)
VLDL: 22 mg/dL (ref <30)

## 2016-03-13 LAB — COMPREHENSIVE METABOLIC PANEL
ALT: 26 U/L (ref 6–29)
AST: 19 U/L (ref 10–35)
Albumin: 3.6 g/dL (ref 3.6–5.1)
Alkaline Phosphatase: 112 U/L (ref 33–130)
BILIRUBIN TOTAL: 0.4 mg/dL (ref 0.2–1.2)
BUN: 19 mg/dL (ref 7–25)
CO2: 19 mmol/L — ABNORMAL LOW (ref 20–31)
CREATININE: 0.73 mg/dL (ref 0.50–1.05)
Calcium: 8.8 mg/dL (ref 8.6–10.4)
Chloride: 107 mmol/L (ref 98–110)
GLUCOSE: 76 mg/dL (ref 65–99)
Potassium: 4.1 mmol/L (ref 3.5–5.3)
Sodium: 140 mmol/L (ref 135–146)
Total Protein: 6.7 g/dL (ref 6.1–8.1)

## 2016-03-13 MED ORDER — TRAMADOL HCL 50 MG PO TABS: 100.0000 mg | ORAL_TABLET | Freq: Three times a day (TID) | ORAL | Status: DC | PRN

## 2016-03-13 MED ORDER — HYDROCHLOROTHIAZIDE 12.5 MG PO CAPS: 12.5000 mg | ORAL_CAPSULE | Freq: Every day | ORAL | Status: DC

## 2016-03-13 MED ORDER — PREDNISONE 10 MG (48) PO TBPK: ORAL_TABLET | ORAL | Status: DC

## 2016-03-13 NOTE — Patient Instructions (Signed)
Follow-up with Dr. Charlestine Night and return here in 1 month.

## 2016-03-13 NOTE — Progress Notes (Signed)
Called in tramadol per jcl 

## 2016-03-13 NOTE — Progress Notes (Signed)
   Subjective:    Patient ID: [name], female    DOB: [birth year], 55 y.o.   MRN: [id number]  HPI She is here for recheck. She continues to have a great deal of pain feeling from her underlying arthropathy. This has interfered with her ability to work. She does a lot of manual labor. She has not responded to NSAIDs and most recently a steroid injection.she does say that the tramadol did help but only minimally.   Review of Systems     Objective:   Physical Exam Alert and in no distress. Swelling noted of several of her PIP as well as MCP joints. Her wrists were also slightly tender in question was swollen. Elbows knees and ankles appeared normal.       Assessment & Plan:  Rheumatoid arthritis involving both hands with positive rheumatoid factor (HCC) - Plan: CBC with Differential/Platelet, Comprehensive metabolic panel, Lipid panel, traMADol (ULTRAM) 50 MG tablet, predniSONE (STERAPRED UNI-PAK 48 TAB) 10 MG (48) TBPK tablet  Secondary hypertension, unspecified - Plan: CBC with Differential/Platelet, Comprehensive metabolic panel, Lipid panel, hydrochlorothiazide (MICROZIDE) 12.5 MG capsule her case was discussed with Dr.Truslow. I will place her on a Sterapred Dosepak. Also start to treat her hypertension which could be possibly related to her underlying SLE. I will also call in tramadol. She is to return here in 1 month. Note for out of work given until she is seen by Dr. Charlestine Night

## 2016-05-25 ENCOUNTER — Other Ambulatory Visit: Payer: Self-pay | Admitting: Obstetrics & Gynecology

## 2016-05-25 DIAGNOSIS — R928 Other abnormal and inconclusive findings on diagnostic imaging of breast: Secondary | ICD-10-CM

## 2016-06-07 ENCOUNTER — Ambulatory Visit
Admission: RE | Admit: 2016-06-07 | Discharge: 2016-06-07 | Disposition: A | Discharge status: Home / Self Care | Payer: Commercial Managed Care - HMO | Source: Ambulatory Visit | Attending: Obstetrics & Gynecology | Admitting: Obstetrics & Gynecology

## 2016-06-07 DIAGNOSIS — R928 Other abnormal and inconclusive findings on diagnostic imaging of breast: Secondary | ICD-10-CM

## 2016-07-27 ENCOUNTER — Encounter: Payer: Self-pay | Admitting: Family Medicine

## 2016-08-01 ENCOUNTER — Ambulatory Visit (INDEPENDENT_AMBULATORY_CARE_PROVIDER_SITE_OTHER): Payer: Commercial Managed Care - HMO | Admitting: Family Medicine

## 2016-08-01 ENCOUNTER — Encounter: Payer: Self-pay | Admitting: Family Medicine

## 2016-08-01 VITALS — BP 122/84 | HR 86 | Wt 182.4 lb

## 2016-08-01 DIAGNOSIS — M069 Rheumatoid arthritis, unspecified: Secondary | ICD-10-CM | POA: Diagnosis not present

## 2016-08-01 DIAGNOSIS — F329 Major depressive disorder, single episode, unspecified: Secondary | ICD-10-CM | POA: Diagnosis not present

## 2016-08-01 DIAGNOSIS — Z23 Encounter for immunization: Secondary | ICD-10-CM

## 2016-08-01 DIAGNOSIS — I159 Secondary hypertension, unspecified: Secondary | ICD-10-CM | POA: Diagnosis not present

## 2016-08-01 DIAGNOSIS — M05742 Rheumatoid arthritis with rheumatoid factor of left hand without organ or systems involvement: Secondary | ICD-10-CM | POA: Diagnosis not present

## 2016-08-01 DIAGNOSIS — R5382 Chronic fatigue, unspecified: Secondary | ICD-10-CM

## 2016-08-01 DIAGNOSIS — M05741 Rheumatoid arthritis with rheumatoid factor of right hand without organ or systems involvement: Secondary | ICD-10-CM | POA: Diagnosis not present

## 2016-08-01 DIAGNOSIS — F32A Depression, unspecified: Secondary | ICD-10-CM

## 2016-08-01 MED ORDER — LISINOPRIL-HYDROCHLOROTHIAZIDE 10-12.5 MG PO TABS: 1.0000 | ORAL_TABLET | Freq: Every day | ORAL | 3 refills | Status: DC

## 2016-08-01 NOTE — Progress Notes (Signed)
   Subjective:    Patient ID: [name], female    DOB: 09-02-1961, 55 y.o.   MRN: [id number]  HPI She is here for a recheck. She is followed by Dr.Truslow and he is now treating her with methotrexate however is causing some slight fatigue. He is concerned about B12. He does have underlying migraine headaches and is using Topamax with good results. She also is on Cymbalta for treatment of her depression. He recently placed her on amlodipine to help with blood pressure and referred her back here for further care. She is also interested in weight loss. She has been walking however the rheumatoid arthritis does interfere with this. She is also seen in nutritionist.   Review of Systems     Objective:   Physical Exam Alert and in no distress otherwise not examined       Assessment & Plan:  Need for prophylactic vaccination and inoculation against influenza - Plan: Flu Vaccine QUAD 36+ mos IM  Rheumatoid arthritis involving both hands with positive rheumatoid factor ([place])  Secondary hypertension, unspecified - Plan: lisinopril-hydrochlorothiazide (PRINZIDE,ZESTORETIC) 10-12.5 MG tablet  Depression  Chronic fatigue - Plan: Vitamin B12  Rheumatoid arthritis involving both elbows, unspecified rheumatoid factor presence ([place]) She mentioned getting a B12 injection. I discussed the lack of good efficacy with that. I will check a B12 level. Also encouraged her to potentially consider joining the [employer] so she can do water aerobics to help with her general conditioning and with weight loss. I will switch her to Prinzide to try and light in her pill load which she wants done. We will need to follow her blood pressure in one month. Discussed possible side effects of the Prinzide. She will continue on her Cymbalta as well as Topamax. I will research the possible use of weight loss pills especially in regard to the 2 medicines she is already taking.

## 2016-08-02 LAB — VITAMIN B12: VITAMIN B 12: 368 pg/mL (ref 200–1100)

## 2016-08-03 ENCOUNTER — Telehealth: Payer: Self-pay

## 2016-08-03 NOTE — Telephone Encounter (Signed)
Pt has appointment to discuss weight loss and also b-12

## 2016-08-07 ENCOUNTER — Encounter: Payer: Self-pay | Admitting: Family Medicine

## 2016-08-07 ENCOUNTER — Ambulatory Visit (INDEPENDENT_AMBULATORY_CARE_PROVIDER_SITE_OTHER): Payer: Commercial Managed Care - HMO | Admitting: Family Medicine

## 2016-08-07 ENCOUNTER — Other Ambulatory Visit: Payer: Self-pay

## 2016-08-07 VITALS — BP 126/90 | HR 75 | Wt 180.0 lb

## 2016-08-07 DIAGNOSIS — E669 Obesity, unspecified: Secondary | ICD-10-CM

## 2016-08-07 MED ORDER — PHENTERMINE HCL 15 MG PO CAPS: 15.0000 mg | ORAL_CAPSULE | ORAL | 1 refills | Status: DC

## 2016-08-07 NOTE — Telephone Encounter (Signed)
Called in phentermine

## 2016-08-07 NOTE — Progress Notes (Signed)
   Subjective:    Patient ID: [name], female    DOB: 1961/10/30, 55 y.o.   MRN: [id number]  HPI She is here for consult concerning weight loss. Presently she is on Topamax. She does have underlying rheumatoid arthritis and is being actively treated. She would like to lose weight which I think would be appropriate. His only she is also on Cymbalta. She has made some recent dietary and exercise changes that she plans to continue.   Review of Systems     Objective:   Physical Exam Alert and in no distress otherwise not examined       Assessment & Plan:  Obesity - Plan: phentermine 15 MG capsule I will add phentermine to her regimen. Since she is only on Topamax the addition of the anterior major give her the equivalent of QSYMIA. I will start her on 50 mg plus continue the Topamax and encouraged her to continue with her diet and exercise program. Recheck here in one month. Also discussed the B12 level with her and encouraged her to take a multivitamin with extra B12.

## 2016-09-05 ENCOUNTER — Ambulatory Visit (INDEPENDENT_AMBULATORY_CARE_PROVIDER_SITE_OTHER): Payer: Commercial Managed Care - HMO | Admitting: Family Medicine

## 2016-09-05 ENCOUNTER — Encounter: Payer: Self-pay | Admitting: Family Medicine

## 2016-09-05 VITALS — BP 126/80 | HR 76 | Ht 66.0 in | Wt 174.0 lb

## 2016-09-05 DIAGNOSIS — M05742 Rheumatoid arthritis with rheumatoid factor of left hand without organ or systems involvement: Secondary | ICD-10-CM | POA: Diagnosis not present

## 2016-09-05 DIAGNOSIS — E6609 Other obesity due to excess calories: Secondary | ICD-10-CM | POA: Diagnosis not present

## 2016-09-05 DIAGNOSIS — M05741 Rheumatoid arthritis with rheumatoid factor of right hand without organ or systems involvement: Secondary | ICD-10-CM

## 2016-09-05 MED ORDER — PHENTERMINE HCL 15 MG PO CAPS: 15.0000 mg | ORAL_CAPSULE | ORAL | 1 refills | Status: DC

## 2016-09-05 NOTE — Progress Notes (Signed)
   Subjective:    Patient ID: [name], female    DOB: 04/15/1961, 55 y.o.   MRN: [id number]  HPI She is here for follow-up on her obesity. She was given phentermine and presently is also taking Topamax for treatment of an underlying headache history. She also states that she was seen recently by her rheumatologist and now he is not sure whether rheumatoid arthritis is a correct diagnosis. Her note was reviewed and he is concerned that this also could be polyarthralgia. She has decided to seek another opinion. She has stopped all of her medications and wants a new physician to see her without any medications on board. Apparently she did have difficulty with MTX and did find steroids useful but realizes the need to not stay on this long-term.   Review of Systems     Objective:   Physical Exam Alert and in no distress. Her weight is down several pounds.       Assessment & Plan:  Obesity due to excess calories, unspecified classification, unspecified whether serious comorbidity present  Rheumatoid arthritis involving both hands with positive rheumatoid factor ([place]) Congratulated her on this. We'll continue her on her present medication regimen to check here in 2 months. Will also send off all data to her new rheumatologist.

## 2016-11-07 ENCOUNTER — Encounter: Payer: Self-pay | Admitting: Family Medicine

## 2016-11-07 ENCOUNTER — Other Ambulatory Visit: Payer: Self-pay

## 2016-11-07 ENCOUNTER — Ambulatory Visit (INDEPENDENT_AMBULATORY_CARE_PROVIDER_SITE_OTHER): Payer: Commercial Managed Care - HMO | Admitting: Family Medicine

## 2016-11-07 VITALS — BP 100/70 | HR 80 | Wt 175.0 lb

## 2016-11-07 DIAGNOSIS — M05742 Rheumatoid arthritis with rheumatoid factor of left hand without organ or systems involvement: Secondary | ICD-10-CM

## 2016-11-07 DIAGNOSIS — J069 Acute upper respiratory infection, unspecified: Secondary | ICD-10-CM

## 2016-11-07 DIAGNOSIS — M05741 Rheumatoid arthritis with rheumatoid factor of right hand without organ or systems involvement: Secondary | ICD-10-CM

## 2016-11-07 DIAGNOSIS — E6609 Other obesity due to excess calories: Secondary | ICD-10-CM | POA: Diagnosis not present

## 2016-11-07 MED ORDER — PHENTERMINE HCL 30 MG PO CAPS: 30.0000 mg | ORAL_CAPSULE | ORAL | 1 refills | Status: DC

## 2016-11-07 NOTE — Telephone Encounter (Signed)
Called in phentermine per [employer]

## 2016-11-07 NOTE — Progress Notes (Signed)
   Subjective:    Patient ID: [name], female    DOB: [birth year], 55 y.o.   MRN: [id number]  HPI She complains of a five-day history it started with rhinorrhea, cough, sneezing with slight sore throat and sinus pressure especially on the right. Today she states she is feeling better. She sometimes feels as if something is stuck in her throat. She also was recently placed back on steroids to help with her underlying rheumatoid arthritis. Prior to this she was losing weight however the last month when she ran out of her phentermine, she states she has lost some ground. States he thinks that the phentermine in it to wear off and not help her with her weight.   Review of Systems     Objective:   Physical Exam Alert and in no distress. Tympanic membranes and canals are normal. Pharyngeal area is normal. Neck is supple without adenopathy or thyromegaly. Cardiac exam shows a regular sinus rhythm without murmurs or gallops. Lungs are clear to auscultation. The weights were reviewed.       Assessment & Plan:  Obesity due to excess calories, unspecified classification, unspecified whether serious comorbidity present  Rheumatoid arthritis involving both hands with positive rheumatoid factor ([place])  Acute URI No therapy for the URI since she is getting better. I will increase her phentermine to 30 mg and she will continue on her Topamax. She is to check with me in 1-2 months to let me know how her weight is.

## 2016-11-28 DIAGNOSIS — R5382 Chronic fatigue, unspecified: Secondary | ICD-10-CM | POA: Diagnosis not present

## 2016-11-28 DIAGNOSIS — M2550 Pain in unspecified joint: Secondary | ICD-10-CM | POA: Diagnosis not present

## 2016-11-28 DIAGNOSIS — M0579 Rheumatoid arthritis with rheumatoid factor of multiple sites without organ or systems involvement: Secondary | ICD-10-CM | POA: Diagnosis not present

## 2016-12-07 DIAGNOSIS — G43009 Migraine without aura, not intractable, without status migrainosus: Secondary | ICD-10-CM | POA: Diagnosis not present

## 2016-12-28 DIAGNOSIS — L700 Acne vulgaris: Secondary | ICD-10-CM | POA: Diagnosis not present

## 2017-01-10 ENCOUNTER — Other Ambulatory Visit: Payer: Self-pay | Admitting: Family Medicine

## 2017-01-10 DIAGNOSIS — M0579 Rheumatoid arthritis with rheumatoid factor of multiple sites without organ or systems involvement: Secondary | ICD-10-CM | POA: Diagnosis not present

## 2017-01-10 DIAGNOSIS — E6609 Other obesity due to excess calories: Secondary | ICD-10-CM

## 2017-01-10 DIAGNOSIS — M2550 Pain in unspecified joint: Secondary | ICD-10-CM | POA: Diagnosis not present

## 2017-01-10 DIAGNOSIS — R5382 Chronic fatigue, unspecified: Secondary | ICD-10-CM | POA: Diagnosis not present

## 2017-01-11 DIAGNOSIS — L720 Epidermal cyst: Secondary | ICD-10-CM | POA: Diagnosis not present

## 2017-01-11 NOTE — Telephone Encounter (Signed)
Think this was supposed to go to you 

## 2017-01-11 NOTE — Telephone Encounter (Signed)
Go ahead and call it in 

## 2017-01-11 NOTE — Telephone Encounter (Signed)
Is this okay to refill? 

## 2017-01-11 NOTE — Telephone Encounter (Signed)
Called in per JCL 

## 2017-01-11 NOTE — Telephone Encounter (Signed)
Find out how she is doing on this and if she has lost any weight and how much

## 2017-01-11 NOTE — Telephone Encounter (Signed)
She said doing ok she has lost 4 lb since last visit her RA is acting up she hasn't been to work in [employer] the RA Dr.is working on changing her [employer]

## 2017-01-18 DIAGNOSIS — D225 Melanocytic nevi of trunk: Secondary | ICD-10-CM | POA: Diagnosis not present

## 2017-01-18 DIAGNOSIS — Z1283 Encounter for screening for malignant neoplasm of skin: Secondary | ICD-10-CM | POA: Diagnosis not present

## 2017-02-15 ENCOUNTER — Other Ambulatory Visit: Payer: Self-pay | Admitting: Family Medicine

## 2017-02-15 DIAGNOSIS — E6609 Other obesity due to excess calories: Secondary | ICD-10-CM

## 2017-02-15 NOTE — Telephone Encounter (Signed)
Is this okay to refill? 

## 2017-02-15 NOTE — Telephone Encounter (Signed)
Have her set up an appointment so we can discuss this further

## 2017-02-15 NOTE — Telephone Encounter (Signed)
Pt has not lost any weight they up her prednisone and now she is on  Cinzia,and Leflunumide now for RA she asked that you could change the med or she could come in for appointment but please dont take her off

## 2017-02-15 NOTE — Telephone Encounter (Signed)
Find out how she is doing and if she has lost any weight

## 2017-02-21 ENCOUNTER — Other Ambulatory Visit: Payer: Self-pay

## 2017-02-21 ENCOUNTER — Encounter: Payer: Self-pay | Admitting: Family Medicine

## 2017-02-21 ENCOUNTER — Ambulatory Visit (INDEPENDENT_AMBULATORY_CARE_PROVIDER_SITE_OTHER): Payer: Commercial Managed Care - HMO | Admitting: Family Medicine

## 2017-02-21 VITALS — BP 120/70 | HR 75 | Wt 172.0 lb

## 2017-02-21 DIAGNOSIS — M2550 Pain in unspecified joint: Secondary | ICD-10-CM | POA: Diagnosis not present

## 2017-02-21 DIAGNOSIS — M05741 Rheumatoid arthritis with rheumatoid factor of right hand without organ or systems involvement: Secondary | ICD-10-CM | POA: Diagnosis not present

## 2017-02-21 DIAGNOSIS — M05742 Rheumatoid arthritis with rheumatoid factor of left hand without organ or systems involvement: Secondary | ICD-10-CM

## 2017-02-21 DIAGNOSIS — R5382 Chronic fatigue, unspecified: Secondary | ICD-10-CM | POA: Diagnosis not present

## 2017-02-21 DIAGNOSIS — E6609 Other obesity due to excess calories: Secondary | ICD-10-CM | POA: Diagnosis not present

## 2017-02-21 DIAGNOSIS — M0579 Rheumatoid arthritis with rheumatoid factor of multiple sites without organ or systems involvement: Secondary | ICD-10-CM | POA: Diagnosis not present

## 2017-02-21 MED ORDER — PHENTERMINE HCL 30 MG PO CAPS: 30.0000 mg | ORAL_CAPSULE | Freq: Every morning | ORAL | 1 refills | Status: DC

## 2017-02-21 NOTE — Progress Notes (Signed)
   Subjective:    Patient ID: [name], female    DOB: 10-Feb-1961, 56 y.o.   MRN: [id number]  HPI she is here for consult. She does have underlying rheumatoid arthritis and for the last several months has been on steroids. She has been off for approximately 10 days and is now on a different DMARD. She was on Humira however it did not work properly. She states that she is doing better. Review of the record indicates that in spite of being on steroids, she has lost approximately 10 pounds since starting on the phentermine in September.  Review of Systems     Objective:   Physical Exam Alert and in no distress otherwise not examined       Assessment & Plan:  Rheumatoid arthritis involving both hands with positive rheumatoid factor (HCC)  Obesity due to excess calories, unspecified classification, unspecified whether serious comorbidity present - Plan: phentermine 30 MG capsule  Explained that since she has indeed lost weight in spite of being on steroids, this is a good sign. We discussed weight and weight management. I will keep her on the phentermine as well as Topamax. We discussed setting a goal of size 10-12 dress size. She will keep me informed in about 2 months as to how she is doing.

## 2017-02-21 NOTE — Patient Instructions (Signed)
Shoot for a size 10-12 dress. Cut back on "white food"

## 2017-02-21 NOTE — Telephone Encounter (Signed)
I called in phentermine per [employer]

## 2017-03-08 DIAGNOSIS — Z86010 Personal history of colonic polyps: Secondary | ICD-10-CM | POA: Diagnosis not present

## 2017-03-29 DIAGNOSIS — G43009 Migraine without aura, not intractable, without status migrainosus: Secondary | ICD-10-CM | POA: Diagnosis not present

## 2017-04-05 DIAGNOSIS — L281 Prurigo nodularis: Secondary | ICD-10-CM | POA: Diagnosis not present

## 2017-04-05 DIAGNOSIS — L821 Other seborrheic keratosis: Secondary | ICD-10-CM | POA: Diagnosis not present

## 2017-04-05 DIAGNOSIS — L700 Acne vulgaris: Secondary | ICD-10-CM | POA: Diagnosis not present

## 2017-04-06 DIAGNOSIS — K635 Polyp of colon: Secondary | ICD-10-CM | POA: Diagnosis not present

## 2017-04-06 DIAGNOSIS — Z86010 Personal history of colonic polyps: Secondary | ICD-10-CM | POA: Diagnosis not present

## 2017-04-06 DIAGNOSIS — D126 Benign neoplasm of colon, unspecified: Secondary | ICD-10-CM | POA: Diagnosis not present

## 2017-04-06 LAB — HM COLONOSCOPY

## 2017-04-07 IMAGING — US ULTRASOUND RIGHT BREAST LIMITED
1 series · 4 of 4 positions shown · non-contrast
Comparison: Previous exam(s).

CLINICAL DATA: Screening recall for a right breast asymmetry.

EXAM:
2D DIGITAL DIAGNOSTIC UNILATERAL RIGHT MAMMOGRAM WITH CAD AND
ADJUNCT TOMO
RIGHT BREAST ULTRASOUND

[Series 1: ultrasound right breast limited · 0.05mm/px · 4 of 4 slices shown]
[im 1/4]
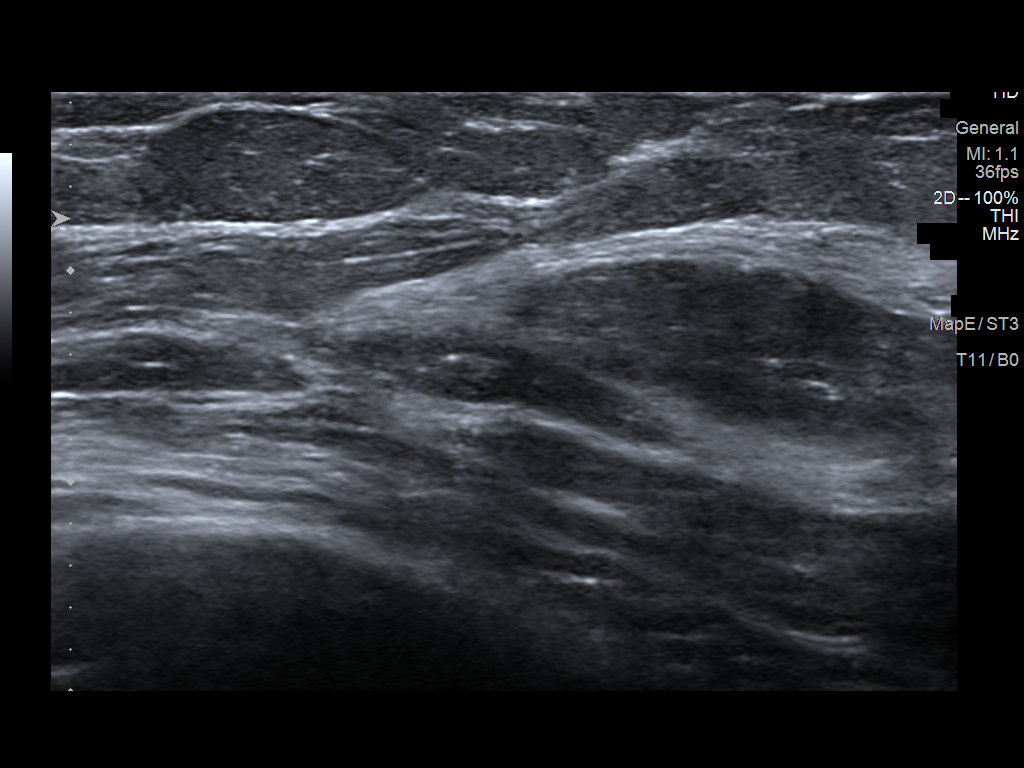
[im 2/4]
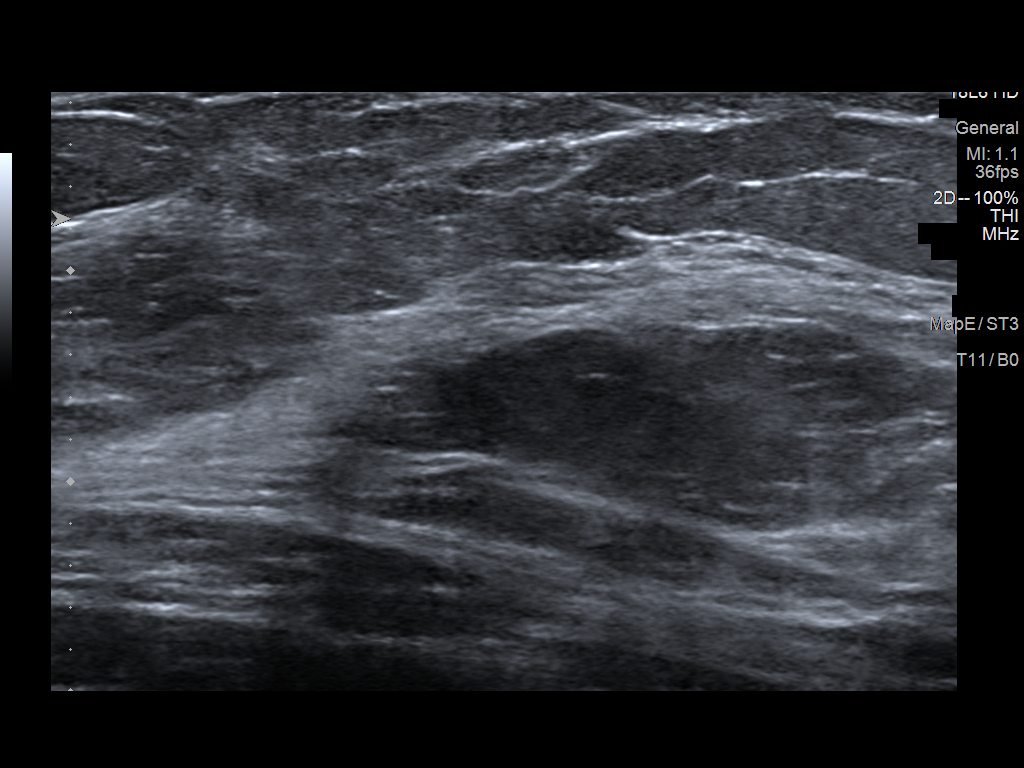
[im 3/4]
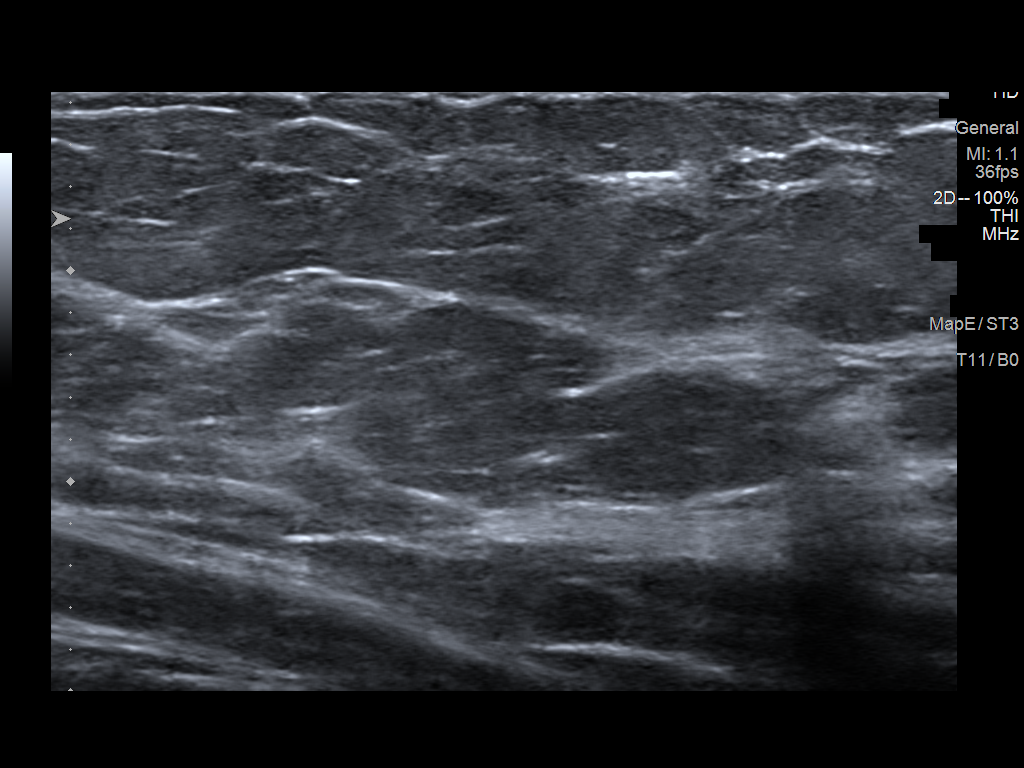
[im 4/4]
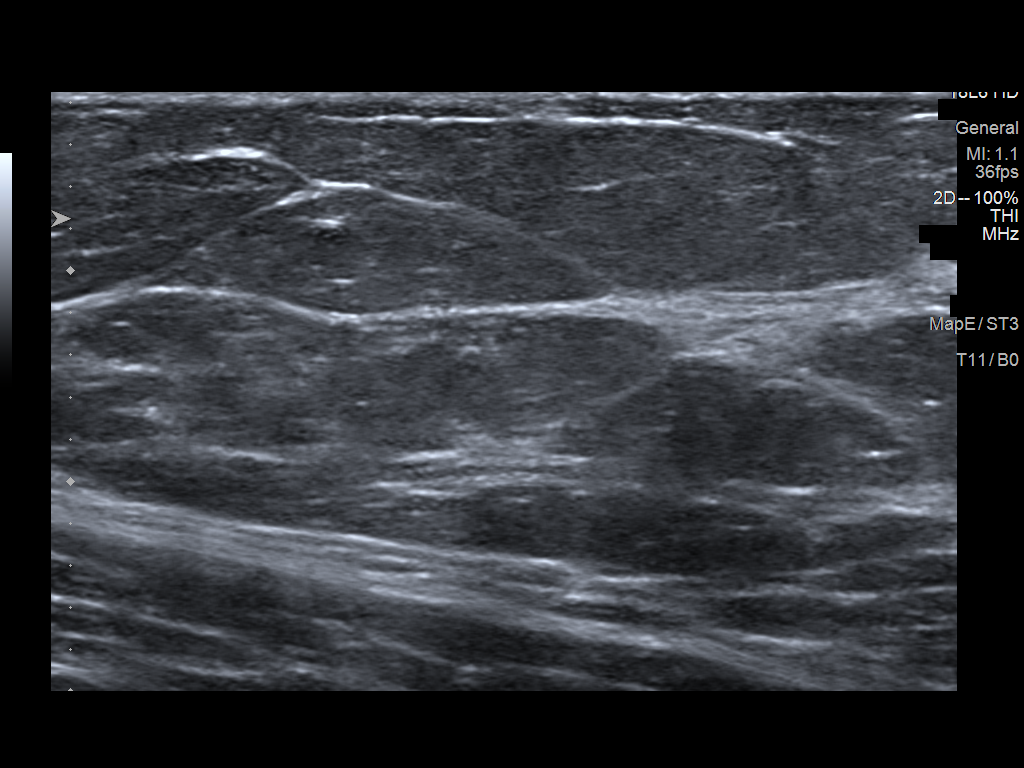

[4 of 4 positions shown; findings below may reference images not displayed]

ACR Breast Density Category c: The breast tissue is heterogeneously
dense, which may obscure small masses.
FINDINGS: The asymmetry of concern in the upper-outer right breast resolves
with additional spot compression and tomosynthesis diagnostic
images. No suspicious calcifications, masses or areas of distortion
are seen in the right breast.

Mammographic images were processed with CAD.
IMPRESSION: Resolution of the asymmetry of concern in the upper-outer right
breast consistent with overlapping fibroglandular tissue. No masses
or suspicious areas of shadowing are identified.

RECOMMENDATION:
Screening mammogram in one year.(Code:[id number])

I have discussed the findings and recommendations with the patient.
Results were also provided in writing at the conclusion of the
visit. If applicable, a reminder letter will be sent to the patient
regarding the next appointment.

BI-RADS CATEGORY  1: Negative.

## 2017-04-25 ENCOUNTER — Ambulatory Visit (INDEPENDENT_AMBULATORY_CARE_PROVIDER_SITE_OTHER): Payer: Commercial Managed Care - HMO | Admitting: Family Medicine

## 2017-04-25 ENCOUNTER — Encounter: Payer: Self-pay | Admitting: Family Medicine

## 2017-04-25 VITALS — BP 124/82 | HR 71 | Ht 65.0 in | Wt 169.0 lb

## 2017-04-25 DIAGNOSIS — Z86010 Personal history of colonic polyps: Secondary | ICD-10-CM

## 2017-04-25 DIAGNOSIS — F329 Major depressive disorder, single episode, unspecified: Secondary | ICD-10-CM | POA: Diagnosis not present

## 2017-04-25 DIAGNOSIS — R5382 Chronic fatigue, unspecified: Secondary | ICD-10-CM | POA: Diagnosis not present

## 2017-04-25 DIAGNOSIS — K5730 Diverticulosis of large intestine without perforation or abscess without bleeding: Secondary | ICD-10-CM | POA: Diagnosis not present

## 2017-04-25 DIAGNOSIS — M069 Rheumatoid arthritis, unspecified: Secondary | ICD-10-CM

## 2017-04-25 DIAGNOSIS — G43909 Migraine, unspecified, not intractable, without status migrainosus: Secondary | ICD-10-CM | POA: Insufficient documentation

## 2017-04-25 DIAGNOSIS — Z860101 Personal history of adenomatous and serrated colon polyps: Secondary | ICD-10-CM | POA: Insufficient documentation

## 2017-04-25 DIAGNOSIS — G43809 Other migraine, not intractable, without status migrainosus: Secondary | ICD-10-CM

## 2017-04-25 DIAGNOSIS — F32A Depression, unspecified: Secondary | ICD-10-CM

## 2017-04-25 NOTE — Progress Notes (Signed)
   Subjective:    Patient ID: [name], female    DOB: [birth year], 56 y.o.   MRN: [id number]  HPI She is here for follow-up visit. She has been on phentermine to help with weight loss. She recently had a colonoscopy which did show evidence of tubular adenoma as well as hyperplastic polyps. She apparently has had 2 other colonoscopies in the past. It also showed evidence of diverticulosis. She was seen recently by her neurologist for treatment of the migraine headache. He presently has her on Topamax and has also treating her depression. She has been on Cymbalta 60 and he apparently is going to increase this to 120 mg. She does complain of depression mainly revolving around her inability to function at the level that she would like. She does suffer from chronic fatigue as well as rheumatoid arthritis. She plans to see her rheumatologist within the next week or 2 and possibly have her medications readjusted. She does have a previous history of depression and suicidal ideation with that but states that she has no feelings at the present time like that.  Review of Systems     Objective:   Physical Exam Alert and in no distress but tearful.       Assessment & Plan:  Hx of adenomatous colonic polyps  Diverticulosis of large intestine without hemorrhage  Depression, unspecified depression type  Chronic fatigue  Rheumatoid arthritis involving both elbows, unspecified rheumatoid factor presence (HCC)  Other migraine without status migrainosus, not intractable She will continue to be followed for her adenomatous polyps in the 5 year basis. Discussed possible counseling with her but will wait and see if the increase in Cymbalta helps with her underlying depression. If not discussed the possibility of me taking over the care of that and possible referral for counseling. She will follow-up with rheumatology and possibly have her medications changed around. I will keep in touch with her  concerning her underlying depression as well as how she is doing with her rheumatoid arthritis. Will stop the phentermine since it is really not help with her weight. Over 25 minutes, greater than 50% spent in counseling and coordination of care.

## 2017-04-26 ENCOUNTER — Encounter: Payer: Self-pay | Admitting: Family Medicine

## 2017-05-02 ENCOUNTER — Encounter: Payer: Self-pay | Admitting: Family Medicine

## 2017-05-02 DIAGNOSIS — M0579 Rheumatoid arthritis with rheumatoid factor of multiple sites without organ or systems involvement: Secondary | ICD-10-CM | POA: Diagnosis not present

## 2017-05-02 DIAGNOSIS — Z79899 Other long term (current) drug therapy: Secondary | ICD-10-CM | POA: Diagnosis not present

## 2017-05-02 DIAGNOSIS — M2550 Pain in unspecified joint: Secondary | ICD-10-CM | POA: Diagnosis not present

## 2017-05-08 ENCOUNTER — Telehealth: Payer: Self-pay | Admitting: Family Medicine

## 2017-05-08 MED ORDER — ALBUTEROL SULFATE HFA 108 (90 BASE) MCG/ACT IN AERS: 2.0000 | INHALATION_SPRAY | Freq: Four times a day (QID) | RESPIRATORY_TRACT | 0 refills | Status: DC | PRN

## 2017-05-08 NOTE — Telephone Encounter (Signed)
Pt called and stated that her Rheumatologist has started her on a new medication. This medication can cause respiratory distress. Pt states that Rheumatologist is requesting that she have a refill of the albuterol inhaler she was given by Dr. Tomi Bamberger a while back. Please send in to [facility]. [employer]. She can be reached at [phone].

## 2017-05-22 DIAGNOSIS — Z0279 Encounter for issue of other medical certificate: Secondary | ICD-10-CM

## 2017-05-24 ENCOUNTER — Encounter: Payer: Self-pay | Admitting: Family Medicine

## 2017-05-24 ENCOUNTER — Ambulatory Visit (INDEPENDENT_AMBULATORY_CARE_PROVIDER_SITE_OTHER): Payer: 59 | Admitting: Family Medicine

## 2017-05-24 ENCOUNTER — Ambulatory Visit
Admission: RE | Admit: 2017-05-24 | Discharge: 2017-05-24 | Disposition: A | Discharge status: Home / Self Care | Payer: 59 | Source: Ambulatory Visit | Attending: Family Medicine | Admitting: Family Medicine

## 2017-05-24 VITALS — BP 104/64 | HR 71 | Temp 97.7°F | Resp 16 | Wt 168.8 lb

## 2017-05-24 DIAGNOSIS — R0609 Other forms of dyspnea: Principal | ICD-10-CM

## 2017-05-24 DIAGNOSIS — J8410 Pulmonary fibrosis, unspecified: Secondary | ICD-10-CM

## 2017-05-24 DIAGNOSIS — M05742 Rheumatoid arthritis with rheumatoid factor of left hand without organ or systems involvement: Secondary | ICD-10-CM

## 2017-05-24 DIAGNOSIS — M05741 Rheumatoid arthritis with rheumatoid factor of right hand without organ or systems involvement: Secondary | ICD-10-CM

## 2017-05-24 DIAGNOSIS — M549 Dorsalgia, unspecified: Secondary | ICD-10-CM | POA: Diagnosis not present

## 2017-05-24 DIAGNOSIS — R0600 Dyspnea, unspecified: Secondary | ICD-10-CM

## 2017-05-24 DIAGNOSIS — R079 Chest pain, unspecified: Secondary | ICD-10-CM | POA: Diagnosis not present

## 2017-05-24 LAB — CBC WITH DIFFERENTIAL/PLATELET
Basophils Absolute: 0 cells/uL (ref 0–200)
Basophils Relative: 0 %
EOS PCT: 3 %
Eosinophils Absolute: 282 cells/uL (ref 15–500)
HCT: 41 % (ref 35.0–45.0)
Hemoglobin: 13.7 g/dL (ref 11.7–15.5)
LYMPHS PCT: 29 %
Lymphs Abs: 2726 cells/uL (ref 850–3900)
MCH: 30 pg (ref 27.0–33.0)
MCHC: 33.4 g/dL (ref 32.0–36.0)
MCV: 89.9 fL (ref 80.0–100.0)
MPV: 12.6 fL — ABNORMAL HIGH (ref 7.5–12.5)
Monocytes Absolute: 846 cells/uL (ref 200–950)
Monocytes Relative: 9 %
NEUTROS PCT: 59 %
Neutro Abs: 5546 cells/uL (ref 1500–7800)
Platelets: 204 10*3/uL (ref 140–400)
RBC: 4.56 MIL/uL (ref 3.80–5.10)
RDW: 13.3 % (ref 11.0–15.0)
WBC: 9.4 10*3/uL (ref 4.0–10.5)

## 2017-05-24 NOTE — Progress Notes (Signed)
Subjective:    Patient ID: [name], female    DOB: March 23, 1961, 56 y.o.   MRN: [id number]  HPI Chief Complaint  Patient presents with  . breathing concerns    breathing concerns. having RA flare up and is causing breathing issues. coughing for about 2 weeks but getting work. pain mid back   She is a 56 year old female with a history of RA and interstitial lung disease who is here with complaints of a 2 week history of DOE and upper back pain. States she cannot walk up 8 steps at her home without feeling out of breath. Also reports a dry cough for the past several weeks, prior to onset of dyspnea.  Upper back pain is between her shoulder blades, has been constant for the past 2 weeks and worse over the past 5 days. Pain is worse with standing and certain movements. She has been awakened with back pain for the past 2 nights.    States she has a history of pneumonia. States she has seen a pulmonologist in the past but has been years ago and cannot recall the name or practice.   States she has had a mild cough since diagnosed with RA last year.  She is being managed by Dr. Lenna Gilford at [facility] rheumatology. States they changed her medication from [place] to [place] in early June prior to these symptoms starting.  She questions whether her symptoms may be related to the new medication.   Denies fever, chills, unexplained fatigue, weight loss, dizziness, rhinorrhea, nasal congestion, sore throat, post nasal drainage, chest pain, palpitations, orthopnea, abdominal pain, N/V/D, LE edema, urinary symptoms.   No leg pain with walking. No calf pain. No history of blood clots.  No recent surgery or immobilization or long distance travel.   Never smoked.   Reviewed allergies, medications, past medical, surgical, family, and social history.    Review of Systems Pertinent positives and negatives in the history of present illness.     Objective:   Physical Exam  Constitutional: She is  oriented to person, place, and time. She appears well-developed and well-nourished. She does not have a sickly appearance. No distress.  HENT:  Right Ear: Tympanic membrane and ear canal normal.  Left Ear: Tympanic membrane and ear canal normal.  Nose: Nose normal.  Mouth/Throat: Uvula is midline, oropharynx is clear and moist and mucous membranes are normal.  Eyes: Conjunctivae and lids are normal. Pupils are equal, round, and reactive to light.  Neck: Full passive range of motion without pain. Neck supple. No JVD present. No thyromegaly present.  Cardiovascular: Normal rate, regular rhythm, normal heart sounds and normal pulses.   No LE edema   Pulmonary/Chest: Effort normal and breath sounds normal. She exhibits no tenderness.  Musculoskeletal:       Cervical back: Normal.       Thoracic back: Normal. She exhibits no tenderness and no bony tenderness.       Right lower leg: Normal.       Left lower leg: Normal.  Negative Homan's  Lymphadenopathy:    She has no cervical adenopathy.       Right: No supraclavicular adenopathy present.       Left: No supraclavicular adenopathy present.  Neurological: She is alert and oriented to person, place, and time. She has normal strength. No cranial nerve deficit or sensory deficit. Gait normal.  Skin: Skin is warm and dry. No rash noted. No pallor.  Psychiatric: She has a  normal mood and affect. Her speech is normal and behavior is normal. Thought content normal. Cognition and memory are normal.   BP 104/64   Pulse 71   Temp 97.7 F (36.5 C) (Oral)   Resp 16   Wt 168 lb 12.8 oz (76.6 kg)   SpO2 97%   BMI 28.09 kg/m       Assessment & Plan:  DOE (dyspnea on exertion) - Plan: CBC with Differential/Platelet, Comprehensive metabolic panel, EKG 77-PZPS, DG Chest 2 View, Spirometry with graph  Upper back pain - Plan: EKG 12-Lead, DG Chest 2 View  INTERSTITIAL LUNG DISEASE  Rheumatoid arthritis involving both hands with positive rheumatoid  factor (HCC)  PFTs performed and normal.  ECG sinus brady.  Discussed that her exam is unremarkable. She is not in any distress. No obvious sign of any infectious process. She is low risk for PE and her history and exam speak against this.  Unable to reproduce upper back pain in office.  Plan to send her for an XR and follow up.  Cannot rule out that her new medication for RA could be playing a role and encouraged her to call her rheumatologist and discuss this possibility.  Plan to have her follow up with her PCP, Dr. Redmond School in 2-3 weeks or sooner if her symptoms worsen or any new symptoms develop. She appears comfortable with this plan.

## 2017-05-25 LAB — COMPREHENSIVE METABOLIC PANEL
ALT: 22 U/L (ref 6–29)
AST: 19 U/L (ref 10–35)
Albumin: 4.3 g/dL (ref 3.6–5.1)
Alkaline Phosphatase: 78 U/L (ref 33–130)
BILIRUBIN TOTAL: 0.4 mg/dL (ref 0.2–1.2)
BUN: 22 mg/dL (ref 7–25)
CO2: 26 mmol/L (ref 20–31)
CREATININE: 1.02 mg/dL (ref 0.50–1.05)
Calcium: 10.3 mg/dL (ref 8.6–10.4)
Chloride: 104 mmol/L (ref 98–110)
GLUCOSE: 82 mg/dL (ref 65–99)
Potassium: 4.2 mmol/L (ref 3.5–5.3)
SODIUM: 139 mmol/L (ref 135–146)
Total Protein: 6.8 g/dL (ref 6.1–8.1)

## 2017-05-31 ENCOUNTER — Telehealth: Payer: Self-pay

## 2017-05-31 NOTE — Telephone Encounter (Signed)
Records faxed to [facility] per signed request to [phone]. [name]

## 2017-06-11 DIAGNOSIS — Z1231 Encounter for screening mammogram for malignant neoplasm of breast: Secondary | ICD-10-CM | POA: Diagnosis not present

## 2017-06-11 DIAGNOSIS — Z01419 Encounter for gynecological examination (general) (routine) without abnormal findings: Secondary | ICD-10-CM | POA: Diagnosis not present

## 2017-07-04 DIAGNOSIS — M2550 Pain in unspecified joint: Secondary | ICD-10-CM | POA: Diagnosis not present

## 2017-07-04 DIAGNOSIS — Z79899 Other long term (current) drug therapy: Secondary | ICD-10-CM | POA: Diagnosis not present

## 2017-07-04 DIAGNOSIS — M0579 Rheumatoid arthritis with rheumatoid factor of multiple sites without organ or systems involvement: Secondary | ICD-10-CM | POA: Diagnosis not present

## 2017-07-31 ENCOUNTER — Ambulatory Visit (INDEPENDENT_AMBULATORY_CARE_PROVIDER_SITE_OTHER): Payer: 59 | Admitting: Family Medicine

## 2017-07-31 ENCOUNTER — Encounter: Payer: Self-pay | Admitting: Family Medicine

## 2017-07-31 VITALS — BP 120/60 | HR 67 | Temp 98.0°F | Resp 16 | Wt 172.0 lb

## 2017-07-31 DIAGNOSIS — M05741 Rheumatoid arthritis with rheumatoid factor of right hand without organ or systems involvement: Secondary | ICD-10-CM | POA: Diagnosis not present

## 2017-07-31 DIAGNOSIS — M05742 Rheumatoid arthritis with rheumatoid factor of left hand without organ or systems involvement: Secondary | ICD-10-CM | POA: Diagnosis not present

## 2017-07-31 DIAGNOSIS — R1030 Lower abdominal pain, unspecified: Secondary | ICD-10-CM | POA: Diagnosis not present

## 2017-07-31 LAB — CBC WITH DIFFERENTIAL/PLATELET
BASOS ABS: 100 {cells}/uL (ref 0–200)
Basophils Relative: 1 %
Eosinophils Absolute: 200 cells/uL (ref 15–500)
Eosinophils Relative: 2 %
HEMATOCRIT: 40.8 % (ref 35.0–45.0)
HEMOGLOBIN: 13.6 g/dL (ref 11.7–15.5)
LYMPHS ABS: 2200 {cells}/uL (ref 850–3900)
Lymphocytes Relative: 22 %
MCH: 30.3 pg (ref 27.0–33.0)
MCHC: 33.3 g/dL (ref 32.0–36.0)
MCV: 90.9 fL (ref 80.0–100.0)
MPV: 12.3 fL (ref 7.5–12.5)
Monocytes Absolute: 700 cells/uL (ref 200–950)
Monocytes Relative: 7 %
NEUTROS PCT: 68 %
Neutro Abs: 6800 cells/uL (ref 1500–7800)
Platelets: 220 10*3/uL (ref 140–400)
RBC: 4.49 MIL/uL (ref 3.80–5.10)
RDW: 13.7 % (ref 11.0–15.0)
WBC: 10 10*3/uL (ref 4.0–10.5)

## 2017-07-31 LAB — POCT URINALYSIS DIP (PROADVANTAGE DEVICE)
BILIRUBIN UA: NEGATIVE
BILIRUBIN UA: NEGATIVE mg/dL
Blood, UA: NEGATIVE
GLUCOSE UA: NEGATIVE mg/dL
Leukocytes, UA: NEGATIVE
Nitrite, UA: NEGATIVE
Protein Ur, POC: NEGATIVE mg/dL
SPECIFIC GRAVITY, URINE: 1.03
pH, UA: 6 (ref 5.0–8.0)

## 2017-07-31 NOTE — Progress Notes (Signed)
   Subjective:    Patient ID: [name], female    DOB: 07/19/1961, 56 y.o.   MRN: [id number]  HPI She complains of a three-week history of lower abdominal discomfort that is intermittent in nature. She states that it never goes away completely. She has had occasional vomiting and some nausea. She has been using Phenergan given to her initially for treatment of migraines. She cannot relate this to any foods, has had no frequency, urinary urgency. Her bowel habits have been normal. She cannot relate this to any particular stresses. She did think it might of been her new RA medication however that was started in June. She did stop it for a couple weeks but noted no change. She has had a hysterectomy. She had a colonoscopy earlier this year.   Review of Systems     Objective:   Physical Exam Alert and in no distress. Cardiac exam shows regular rhythm without murmurs goes. Lungs are clear to auscultation. Abdominal exam shows slight lower abdominal discomfort but no rebound or masses. Urinalysis was negative.       Assessment & Plan:  Lower abdominal pain - Plan: POCT Urinalysis DIP (Proadvantage Device), CBC with Differential/Platelet, Comprehensive metabolic panel  Rheumatoid arthritis involving both hands with positive rheumatoid factor (HCC) I will do blood work on her have her try Prilosec 40 mg daily and if no improvement probably refer to GI. I do not think this is related to any the meds she is on specifically her RA meds.

## 2017-07-31 NOTE — Patient Instructions (Signed)
Take 2 Prilosec daily and that see what that will do

## 2017-08-01 LAB — COMPREHENSIVE METABOLIC PANEL
ALK PHOS: 103 U/L (ref 33–130)
ALT: 30 U/L — AB (ref 6–29)
AST: 17 U/L (ref 10–35)
Albumin: 4.3 g/dL (ref 3.6–5.1)
BILIRUBIN TOTAL: 0.3 mg/dL (ref 0.2–1.2)
BUN: 18 mg/dL (ref 7–25)
CO2: 21 mmol/L (ref 20–32)
CREATININE: 0.9 mg/dL (ref 0.50–1.05)
Calcium: 9.2 mg/dL (ref 8.6–10.4)
Chloride: 107 mmol/L (ref 98–110)
Glucose, Bld: 86 mg/dL (ref 65–99)
Potassium: 3.6 mmol/L (ref 3.5–5.3)
SODIUM: 141 mmol/L (ref 135–146)
TOTAL PROTEIN: 6.8 g/dL (ref 6.1–8.1)

## 2017-08-02 ENCOUNTER — Other Ambulatory Visit: Payer: Self-pay | Admitting: Family Medicine

## 2017-08-02 DIAGNOSIS — G43009 Migraine without aura, not intractable, without status migrainosus: Secondary | ICD-10-CM | POA: Diagnosis not present

## 2017-08-02 DIAGNOSIS — G4700 Insomnia, unspecified: Secondary | ICD-10-CM | POA: Diagnosis not present

## 2017-08-02 DIAGNOSIS — I159 Secondary hypertension, unspecified: Secondary | ICD-10-CM

## 2017-09-05 ENCOUNTER — Other Ambulatory Visit: Payer: Self-pay | Admitting: Gastroenterology

## 2017-09-05 DIAGNOSIS — Z86010 Personal history of colonic polyps: Secondary | ICD-10-CM | POA: Diagnosis not present

## 2017-09-05 DIAGNOSIS — R1011 Right upper quadrant pain: Secondary | ICD-10-CM

## 2017-09-07 ENCOUNTER — Inpatient Hospital Stay
Admission: RE | Admit: 2017-09-07 | Discharge: 2017-09-07 | Disposition: A | Discharge status: Home / Self Care | Payer: Self-pay | Source: Ambulatory Visit | Attending: Gastroenterology | Admitting: Gastroenterology

## 2017-09-12 ENCOUNTER — Other Ambulatory Visit: Payer: Self-pay

## 2017-09-13 ENCOUNTER — Ambulatory Visit
Admission: RE | Admit: 2017-09-13 | Discharge: 2017-09-13 | Disposition: A | Discharge status: Home / Self Care | Payer: 59 | Source: Ambulatory Visit | Attending: Gastroenterology | Admitting: Gastroenterology

## 2017-09-13 DIAGNOSIS — R1011 Right upper quadrant pain: Secondary | ICD-10-CM

## 2017-09-13 DIAGNOSIS — K5730 Diverticulosis of large intestine without perforation or abscess without bleeding: Secondary | ICD-10-CM | POA: Diagnosis not present

## 2017-09-13 MED ORDER — IOPAMIDOL (ISOVUE-300) INJECTION 61%
100.0000 mL | Freq: Once | INTRAVENOUS | Status: AC | PRN
  Administered 2017-09-13: 100 mL via INTRAVENOUS

## 2017-10-10 DIAGNOSIS — M150 Primary generalized (osteo)arthritis: Secondary | ICD-10-CM | POA: Diagnosis not present

## 2017-10-10 DIAGNOSIS — M2550 Pain in unspecified joint: Secondary | ICD-10-CM | POA: Diagnosis not present

## 2017-10-10 DIAGNOSIS — N200 Calculus of kidney: Secondary | ICD-10-CM | POA: Diagnosis not present

## 2017-10-10 DIAGNOSIS — Z79899 Other long term (current) drug therapy: Secondary | ICD-10-CM | POA: Diagnosis not present

## 2017-10-10 DIAGNOSIS — M0579 Rheumatoid arthritis with rheumatoid factor of multiple sites without organ or systems involvement: Secondary | ICD-10-CM | POA: Diagnosis not present

## 2017-11-12 ENCOUNTER — Other Ambulatory Visit: Payer: Self-pay | Admitting: Family Medicine

## 2017-11-12 DIAGNOSIS — I159 Secondary hypertension, unspecified: Secondary | ICD-10-CM

## 2018-01-10 DIAGNOSIS — M2550 Pain in unspecified joint: Secondary | ICD-10-CM | POA: Diagnosis not present

## 2018-01-10 DIAGNOSIS — M0579 Rheumatoid arthritis with rheumatoid factor of multiple sites without organ or systems involvement: Secondary | ICD-10-CM | POA: Diagnosis not present

## 2018-01-10 DIAGNOSIS — M7989 Other specified soft tissue disorders: Secondary | ICD-10-CM | POA: Diagnosis not present

## 2018-01-15 DIAGNOSIS — Z79899 Other long term (current) drug therapy: Secondary | ICD-10-CM | POA: Diagnosis not present

## 2018-01-15 DIAGNOSIS — R0602 Shortness of breath: Secondary | ICD-10-CM | POA: Diagnosis not present

## 2018-01-15 DIAGNOSIS — R5383 Other fatigue: Secondary | ICD-10-CM | POA: Diagnosis not present

## 2018-02-03 ENCOUNTER — Other Ambulatory Visit: Payer: Self-pay | Admitting: Family Medicine

## 2018-02-03 DIAGNOSIS — I159 Secondary hypertension, unspecified: Secondary | ICD-10-CM

## 2018-02-04 ENCOUNTER — Telehealth: Payer: Self-pay

## 2018-02-04 NOTE — Telephone Encounter (Signed)
Pt was called to let her know it is time for med check . [place]

## 2018-02-12 DIAGNOSIS — R945 Abnormal results of liver function studies: Secondary | ICD-10-CM | POA: Diagnosis not present

## 2018-02-12 DIAGNOSIS — I10 Essential (primary) hypertension: Secondary | ICD-10-CM | POA: Diagnosis not present

## 2018-03-03 ENCOUNTER — Other Ambulatory Visit: Payer: Self-pay | Admitting: Family Medicine

## 2018-03-03 DIAGNOSIS — I159 Secondary hypertension, unspecified: Secondary | ICD-10-CM

## 2018-03-04 ENCOUNTER — Telehealth: Payer: Self-pay

## 2018-03-04 NOTE — Telephone Encounter (Signed)
Pt was called to schedule a med check  . Med was sent in for 30 day and asked pt to call back to make an appt. [place]

## 2018-04-08 ENCOUNTER — Ambulatory Visit: Payer: 59 | Admitting: Family Medicine

## 2018-04-08 ENCOUNTER — Encounter: Payer: Self-pay | Admitting: Family Medicine

## 2018-04-08 VITALS — BP 104/68 | HR 79 | Temp 97.9°F | Ht 65.75 in | Wt 175.0 lb

## 2018-04-08 DIAGNOSIS — M05742 Rheumatoid arthritis with rheumatoid factor of left hand without organ or systems involvement: Secondary | ICD-10-CM

## 2018-04-08 DIAGNOSIS — F329 Major depressive disorder, single episode, unspecified: Secondary | ICD-10-CM

## 2018-04-08 DIAGNOSIS — F32A Depression, unspecified: Secondary | ICD-10-CM

## 2018-04-08 DIAGNOSIS — E6609 Other obesity due to excess calories: Secondary | ICD-10-CM | POA: Diagnosis not present

## 2018-04-08 DIAGNOSIS — G43809 Other migraine, not intractable, without status migrainosus: Secondary | ICD-10-CM

## 2018-04-08 DIAGNOSIS — M05741 Rheumatoid arthritis with rheumatoid factor of right hand without organ or systems involvement: Secondary | ICD-10-CM

## 2018-04-08 DIAGNOSIS — J45909 Unspecified asthma, uncomplicated: Secondary | ICD-10-CM

## 2018-04-08 DIAGNOSIS — Z23 Encounter for immunization: Secondary | ICD-10-CM

## 2018-04-08 MED ORDER — FLUOXETINE HCL 10 MG PO CAPS: 10.0000 mg | ORAL_CAPSULE | Freq: Every day | ORAL | 3 refills | Status: DC

## 2018-04-08 NOTE — Progress Notes (Signed)
   Subjective:    Patient ID: [name], female    DOB: May 29, 1961, 57 y.o.   MRN: [id number]  HPI She is here for medication check visit.  She continues to complain of depression.  She has apparently been tried on multiple medications in the past.  Presently she is taking Cymbalta and is not sure whether it really helped with the depression or her underlying pain.  She continues to be followed by rheumatology for her underlying rheumatoid arthritis which certainly contributes to her generalized aches and pains.  She is also on Topamax for treatment of her migraines and seems to be quite stable on that.  She uses pro-air usually once or twice per month.  Review of the record indicates she needs a tetanus shot.  She had a mammogram in August 2019.  She also complains of bloody drainage from the left ear when she woke up this morning but did not have a previous history of headache, fever, chills, sore throat.   Review of Systems     Objective:   Physical Exam Alert and in no distress. Tympanic membrane on the right is normal, left is slightly dull with evidence of bleeding near the malleus and some blood at the base of the TM canals are normal. Pharyngeal area is normal. Neck is supple without adenopathy or thyromegaly. Cardiac exam shows a regular sinus rhythm without murmurs or gallops. Lungs are clear to auscultation.        Assessment & Plan:  Obesity due to excess calories, unspecified classification, unspecified whether serious comorbidity present - Plan: CBC with Differential/Platelet, Comprehensive metabolic panel, Lipid panel, TSH  Reactive airway disease without complication, unspecified asthma severity, unspecified whether persistent  Rheumatoid arthritis involving both hands with positive rheumatoid factor ([place]) - Plan: CBC with Differential/Platelet, Comprehensive metabolic panel  Other migraine without status migrainosus, not intractable  Depression, unspecified  depression type - Plan: CBC with Differential/Platelet, Comprehensive metabolic panel, FLUoxetine (PROZAC) 10 MG capsule  Need for Tdap vaccination - Plan: Tdap vaccine greater than or equal to 7yo IM  She has concerns over any medication that would cause her to gain weight.  I reassured her that I do not want that to happen.  I will start her on the low-dose Prozac.  May also consider trying Modafinil on possible referral to psychiatry for med management.  Strongly encouraged her to get more physically active as that would help her overall physical conditioning and depression. I also stated that since she is stable on her migraine medication, I will continue her on her Topamax. She will continue to be followed by rheumatology. I discussed the drainage from the left ear but at this point since she is not having any symptoms, I will not treat her.

## 2018-04-08 NOTE — Patient Instructions (Signed)
Call me in 1 month and let me know how you are doing.

## 2018-04-09 ENCOUNTER — Other Ambulatory Visit: Payer: Self-pay | Admitting: Family Medicine

## 2018-04-09 DIAGNOSIS — I159 Secondary hypertension, unspecified: Secondary | ICD-10-CM

## 2018-04-09 LAB — CBC WITH DIFFERENTIAL/PLATELET
BASOS ABS: 0.1 10*3/uL (ref 0.0–0.2)
Basos: 1 %
EOS (ABSOLUTE): 0.2 10*3/uL (ref 0.0–0.4)
Eos: 3 %
Hematocrit: 37.9 % (ref 34.0–46.6)
Hemoglobin: 12.9 g/dL (ref 11.1–15.9)
IMMATURE GRANS (ABS): 0.1 10*3/uL (ref 0.0–0.1)
Immature Granulocytes: 1 %
LYMPHS: 20 %
Lymphocytes Absolute: 1.3 10*3/uL (ref 0.7–3.1)
MCH: 29.3 pg (ref 26.6–33.0)
MCHC: 34 g/dL (ref 31.5–35.7)
MCV: 86 fL (ref 79–97)
Monocytes Absolute: 0.5 10*3/uL (ref 0.1–0.9)
Monocytes: 8 %
NEUTROS ABS: 4.6 10*3/uL (ref 1.4–7.0)
NEUTROS PCT: 67 %
PLATELETS: 224 10*3/uL (ref 150–379)
RBC: 4.41 x10E6/uL (ref 3.77–5.28)
RDW: 13.7 % (ref 12.3–15.4)
WBC: 6.6 10*3/uL (ref 3.4–10.8)

## 2018-04-09 LAB — COMPREHENSIVE METABOLIC PANEL
ALK PHOS: 106 IU/L (ref 39–117)
ALT: 24 IU/L (ref 0–32)
AST: 16 IU/L (ref 0–40)
Albumin/Globulin Ratio: 1.8 (ref 1.2–2.2)
Albumin: 4.4 g/dL (ref 3.5–5.5)
BILIRUBIN TOTAL: 0.3 mg/dL (ref 0.0–1.2)
BUN/Creatinine Ratio: 19 (ref 9–23)
BUN: 21 mg/dL (ref 6–24)
CHLORIDE: 102 mmol/L (ref 96–106)
CO2: 21 mmol/L (ref 20–29)
Calcium: 9.3 mg/dL (ref 8.7–10.2)
Creatinine, Ser: 1.09 mg/dL — ABNORMAL HIGH (ref 0.57–1.00)
GFR calc non Af Amer: 57 mL/min/{1.73_m2} — ABNORMAL LOW (ref >59)
GFR, EST AFRICAN AMERICAN: 66 mL/min/{1.73_m2} (ref >59)
GLUCOSE: 99 mg/dL (ref 65–99)
Globulin, Total: 2.5 g/dL (ref 1.5–4.5)
Potassium: 4 mmol/L (ref 3.5–5.2)
Sodium: 140 mmol/L (ref 134–144)
TOTAL PROTEIN: 6.9 g/dL (ref 6.0–8.5)

## 2018-04-09 LAB — LIPID PANEL
CHOLESTEROL TOTAL: 212 mg/dL — AB (ref 100–199)
Chol/HDL Ratio: 3.5 ratio (ref 0.0–4.4)
HDL: 60 mg/dL (ref >39)
LDL Calculated: 128 mg/dL — ABNORMAL HIGH (ref 0–99)
TRIGLYCERIDES: 119 mg/dL (ref 0–149)
VLDL CHOLESTEROL CAL: 24 mg/dL (ref 5–40)

## 2018-04-09 LAB — TSH: TSH: 1.07 u[IU]/mL (ref 0.450–4.500)

## 2018-04-13 ENCOUNTER — Ambulatory Visit
Admission: EM | Admit: 2018-04-13 | Discharge: 2018-04-13 | Disposition: A | Discharge status: Home / Self Care | Payer: 59 | Attending: Family Medicine | Admitting: Family Medicine

## 2018-04-13 ENCOUNTER — Other Ambulatory Visit: Payer: Self-pay

## 2018-04-13 DIAGNOSIS — M79641 Pain in right hand: Secondary | ICD-10-CM | POA: Diagnosis not present

## 2018-04-13 DIAGNOSIS — M069 Rheumatoid arthritis, unspecified: Secondary | ICD-10-CM | POA: Diagnosis not present

## 2018-04-13 MED ORDER — HYDROCODONE-ACETAMINOPHEN 5-325 MG PO TABS: 1.0000 | ORAL_TABLET | Freq: Four times a day (QID) | ORAL | 0 refills | Status: DC | PRN

## 2018-04-13 MED ORDER — METHYLPREDNISOLONE SODIUM SUCC 40 MG IJ SOLR
80.0000 mg | Freq: Once | INTRAMUSCULAR | Status: AC
  Administered 2018-04-13: 80 mg via INTRAMUSCULAR

## 2018-04-13 NOTE — ED Provider Notes (Signed)
[facility]  CSN: [id number] Arrival date & time: 04/13/18  1445  History   Chief Complaint Chief Complaint  Patient presents with  . Hand Pain   HPI  57 year old female presents with right hand pain.  Patient has rheumatoid arthritis.  She states that she has been experiencing a flare of her rheumatoid arthritis.  Darted last night/yesterday evening.  She reports severe pain of her right hand extending proximally.  She states that her pain is severe.  Worse than her prior flares.  She states that she has taken oral prednisone as of today as well as oxycodone and hydrocodone which have not helped.  Patient requesting steroid injection today.  No reports of fall, trauma, injury.  Worse with activity.  No relieving factors.  No other complaints.  Past Medical History:  Diagnosis Date  . Anxiety   . Decreased libido    on depo testosterone injections from GYN (Dr. Stann Mainland at [facility] 04/2015)  . Depression   . Hemorrhoids   . History of colon polyps    TUBULAR ADENOMA  . History of kidney stones   . History of pancreatitis    2001  . Hydronephrosis, right   . IBS (irritable bowel syndrome)   . Medullary sponge kidney   . Migraine   . Mild intermittent asthma    NO INHALER  . Pleural fibrosis    MILD SUBPLEURAL FIBROSIS-- LAST CT 2011--  PER PULMOLOGIST NOTE DR SOOD 2011   Patient Active Problem List   Diagnosis Date Noted  . Hx of adenomatous colonic polyps 04/25/2017  . Diverticulosis of large intestine without hemorrhage 04/25/2017  . Migraine 04/25/2017  . Rheumatoid arthritis involving both hands with positive rheumatoid factor ([place]) 11/07/2016  . Obesity due to excess calories 09/05/2016  . Calculus of kidney 07/13/2014  . Depression 10/24/2011  . Marital conflict 81/11/7508  . Reactive airway disease 04/29/2010  . INTERSTITIAL LUNG DISEASE 04/29/2010   Past Surgical History:  Procedure Laterality Date  . ABDOMINAL HYSTERECTOMY  1996  . CESAREAN SECTION  X2    . COLONOSCOPY W/ POLYPECTOMY  2004  &  01-16-2011  . CYSTOSCOPY WITH RETROGRADE PYELOGRAM, URETEROSCOPY AND STENT PLACEMENT Right 06/18/2014   Procedure: CYSTOSCOPY WITH RETROGRADE PYELOGRAM,  STENT PLACEMENT;  Surgeon: Arvil Persons, MD;  Location: [facility];  Service: Urology;  Laterality: Right;  . CYSTOSCOPY/RETROGRADE/URETEROSCOPY Right 07/13/2014   Procedure: CYSTOSCOPY/RETROGRADE/RIGHT URETEROSCOPY AND REMOVAL OF RIGHT STENT;  Surgeon: Jorja Loa, MD;  Location: [facility];  Service: Urology;  Laterality: Right;  . CYSTOSCOPY/RETROGRADE/URETEROSCOPY/STONE EXTRACTION WITH BASKET Right 06/08/2014   Procedure: CYSTOSCOPY RIGHT RETROGRADE/URETEROSCOPY/STONE EXTRACTION WITH BASKET WITH STENT PLACEMENT;  Surgeon: Jorja Loa, MD;  Location: [facility];  Service: Urology;  Laterality: Right;  . ERCP  06/2000  . EXTRACORPOREAL SHOCK WAVE LITHOTRIPSY Right 12-24-2012  . HOLMIUM LASER APPLICATION Right 2/58/5277   Procedure:  HOLMIUM LASER APPLICATION;  Surgeon: Jorja Loa, MD;  Location[facility];  Service: Urology;  Laterality: Right;  . HOLMIUM LASER APPLICATION Right 07/20/2352   Procedure: HOLMIUM LASER APPLICATION;  Surgeon: Jorja Loa, MD;  Location: [facility];  Service: Urology;  Laterality: Right;  . LAPAROSCOPIC CHOLECYSTECTOMY  [phone]  . PARTIAL NEPHRECTOMY Bilateral LEFT  05-18-2006/   RIGHT 1998   LEFT (ANGIOMYOLIPOMA)   RIGHT (BENIGN TUMOR)  . TUBAL LIGATION  1993   OB History   None    Home Medications    Prior to Admission  medications   Medication Sig Start Date End Date Taking? Authorizing Provider  aspirin 81 MG chewable tablet Chew 81 mg by mouth daily.    [provider]  DULoxetine (CYMBALTA) 60 MG capsule Take 60 mg by mouth every morning.     [provider]  estradiol (ESTRACE) 2 MG tablet Take 2 mg by mouth daily.    [provider]  FLUoxetine (PROZAC) 10 MG  capsule Take 1 capsule (10 mg total) by mouth daily. 04/08/18   Denita Lung, MD  HYDROcodone-acetaminophen (NORCO/VICODIN) 5-325 MG tablet Take 1 tablet by mouth every 6 (six) hours as needed. 04/13/18   Coral Spikes, DO  hydroxychloroquine (PLAQUENIL) 200 MG tablet Take 200 mg by mouth 2 (two) times daily.    [provider]  leflunomide (ARAVA) 10 MG tablet Take 10 mg by mouth daily.    [provider]  lisinopril-hydrochlorothiazide (PRINZIDE,ZESTORETIC) 10-12.5 MG tablet TAKE 1 TABLET BY MOUTH DAILY 04/09/18   Denita Lung, MD  PROAIR HFA 108 [phone] Base) MCG/ACT inhaler INHALE 2 PUFFS INTO THE LUNGS EVERY 6 HOURS AS NEEDED FOR WHEEZING OR SHORTNESS OF BREATH 08/02/17   Denita Lung, MD  spironolactone (ALDACTONE) 25 MG tablet Take 25 mg by mouth daily.    [provider]  topiramate (TOPAMAX) 100 MG tablet Take 100 mg by mouth daily. This is a Forensic psychologist, Historical, MD  VITAMIN D, CHOLECALCIFEROL, PO Take 1,200 Units by mouth daily.    [provider]  zolpidem (AMBIEN) 10 MG tablet Take 10 mg by mouth at bedtime as needed for sleep.    [provider]   Family History Family History  Problem Relation Age of Onset  . Cancer Father    Social History Social History   Tobacco Use  . Smoking status: Never Smoker  . Smokeless tobacco: Never Used  Substance Use Topics  . Alcohol use: No  . Drug use: No   Allergies   Penicillins  Review of Systems Review of Systems  Constitutional: Negative.   Musculoskeletal:       Hand pain, RA flare.   Physical Exam Triage Vital Signs ED Triage Vitals  Enc Vitals Group     BP 04/13/18 1452 116/62     Pulse Rate 04/13/18 1452 77     Resp 04/13/18 1452 18     Temp 04/13/18 1452 98.1 F (36.7 C)     Temp Source 04/13/18 1452 Oral     SpO2 04/13/18 1452 100 %     Weight 04/13/18 1454 175 lb (79.4 kg)     Height 04/13/18 1454 5\' 5"  (1.651 m)     Head Circumference --      Peak Flow  --      Pain Score 04/13/18 1454 10     Pain Loc --      Pain Edu? --      Excl. in [place]? --    Updated Vital Signs BP 116/62 (BP Location: Left Arm)   Pulse 77   Temp 98.1 F (36.7 C) (Oral)   Resp 18   Ht 5\' 5"  (1.651 m)   Wt 175 lb (79.4 kg)   SpO2 100%   BMI 29.12 kg/m    Physical Exam  Constitutional: She is oriented to person, place, and time. She appears well-developed. No distress.  HENT:  Head: Normocephalic and atraumatic.  Cardiovascular: Normal rate and regular rhythm.  Pulmonary/Chest: Effort normal and breath sounds normal.  She has no wheezes. She has no rales.  Musculoskeletal:  Patient with diffuse tenderness to palpation of the joints of the right hand.  Neurological: She is alert and oriented to person, place, and time.  Psychiatric: She has a normal mood and affect. Her behavior is normal.  Nursing note and vitals reviewed.  UC Treatments / Results  Labs (all labs ordered are listed, but only abnormal results are displayed) Labs Reviewed - No data to display  EKG None  Radiology No results found.  Procedures Procedures (including critical care time)  Medications Ordered in UC Medications  methylPREDNISolone sodium succinate (SOLU-MEDROL) 40 mg/mL injection 80 mg (80 mg Intramuscular Given 04/13/18 1512)    Initial Impression / Assessment and Plan / UC Course  I have reviewed the triage vital signs and the nursing notes.  Pertinent labs & imaging results that were available during my care of the patient were reviewed by me and considered in my medical decision making (see chart for details).    57 year old female presenting with RA flare.  IM injection of Solu-Medrol given today.  Advised to continue prednisone at home.  Prescription for Vicodin given.  Final Clinical Impressions(s) / UC Diagnoses   Final diagnoses:  Flare of rheumatoid arthritis ([place])     Discharge Instructions     Continue the prednisone at home.  Call rheumatology  on Monday.  Pain medication as needed.  Take care  Dr. Lacinda Axon    ED Prescriptions    Medication Sig Dispense Auth. Provider   HYDROcodone-acetaminophen (NORCO/VICODIN) 5-325 MG tablet Take 1 tablet by mouth every 6 (six) hours as needed. 10 tablet Coral Spikes, DO     Controlled Substance Prescriptions [place] Controlled Substance Registry consulted? Yes, I have consulted the [place] Controlled Substances Registry for this patient, and feel the risk/benefit ratio today is favorable for proceeding with this prescription for a controlled substance. **Patient has not had an opiate prescription since 2017 and it was tramadol.   Coral Spikes, [place] 04/13/18 1534

## 2018-04-13 NOTE — Discharge Instructions (Signed)
Continue the prednisone at home.  Call rheumatology on Monday.  Pain medication as needed.  Take care  Dr. Lacinda Axon

## 2018-04-13 NOTE — ED Triage Notes (Signed)
Pt reports rheumatoid arthritis pain starting yesterday. She reports she usually either needs steroid injection or a narcotic to take the pain away. Pain 10/10

## 2018-04-16 DIAGNOSIS — M0579 Rheumatoid arthritis with rheumatoid factor of multiple sites without organ or systems involvement: Secondary | ICD-10-CM | POA: Diagnosis not present

## 2018-04-16 DIAGNOSIS — M7989 Other specified soft tissue disorders: Secondary | ICD-10-CM | POA: Diagnosis not present

## 2018-04-16 DIAGNOSIS — Z79899 Other long term (current) drug therapy: Secondary | ICD-10-CM | POA: Diagnosis not present

## 2018-04-16 DIAGNOSIS — M2550 Pain in unspecified joint: Secondary | ICD-10-CM | POA: Diagnosis not present

## 2018-06-12 DIAGNOSIS — H11153 Pinguecula, bilateral: Secondary | ICD-10-CM | POA: Diagnosis not present

## 2018-06-12 DIAGNOSIS — Z79899 Other long term (current) drug therapy: Secondary | ICD-10-CM | POA: Diagnosis not present

## 2018-06-20 ENCOUNTER — Other Ambulatory Visit: Payer: Self-pay | Admitting: Family Medicine

## 2018-06-20 DIAGNOSIS — I159 Secondary hypertension, unspecified: Secondary | ICD-10-CM

## 2018-06-21 ENCOUNTER — Encounter (HOSPITAL_COMMUNITY): Payer: Self-pay | Admitting: Emergency Medicine

## 2018-06-21 ENCOUNTER — Ambulatory Visit
Admission: EM | Admit: 2018-06-21 | Discharge: 2018-06-21 | Disposition: A | Discharge status: Nursing Home (Includes ICF) | Payer: 59 | Attending: Family Medicine | Admitting: Family Medicine

## 2018-06-21 ENCOUNTER — Emergency Department (HOSPITAL_COMMUNITY): Payer: 59

## 2018-06-21 ENCOUNTER — Emergency Department (HOSPITAL_COMMUNITY)
Admission: EM | Admit: 2018-06-21 | Discharge: 2018-06-22 | Disposition: A | Discharge status: Home / Self Care | Payer: 59 | Attending: Emergency Medicine | Admitting: Emergency Medicine

## 2018-06-21 DIAGNOSIS — R110 Nausea: Secondary | ICD-10-CM | POA: Diagnosis not present

## 2018-06-21 DIAGNOSIS — Z79899 Other long term (current) drug therapy: Secondary | ICD-10-CM | POA: Diagnosis not present

## 2018-06-21 DIAGNOSIS — R112 Nausea with vomiting, unspecified: Secondary | ICD-10-CM | POA: Insufficient documentation

## 2018-06-21 DIAGNOSIS — R109 Unspecified abdominal pain: Secondary | ICD-10-CM | POA: Diagnosis present

## 2018-06-21 DIAGNOSIS — Z7982 Long term (current) use of aspirin: Secondary | ICD-10-CM | POA: Diagnosis not present

## 2018-06-21 DIAGNOSIS — R630 Anorexia: Secondary | ICD-10-CM | POA: Diagnosis not present

## 2018-06-21 DIAGNOSIS — R1031 Right lower quadrant pain: Secondary | ICD-10-CM

## 2018-06-21 DIAGNOSIS — J45909 Unspecified asthma, uncomplicated: Secondary | ICD-10-CM | POA: Diagnosis not present

## 2018-06-21 LAB — CBC WITH DIFFERENTIAL/PLATELET
Abs Immature Granulocytes: 0 10*3/uL (ref 0.0–0.1)
BASOS ABS: 0.1 10*3/uL (ref 0–0.1)
BASOS ABS: 0.1 10*3/uL (ref 0.0–0.1)
BASOS PCT: 1 %
Basophils Relative: 1 %
EOS ABS: 0.3 10*3/uL (ref 0–0.7)
EOS ABS: 0.3 10*3/uL (ref 0.0–0.7)
Eosinophils Relative: 4 %
Eosinophils Relative: 4 %
HCT: 41.5 % (ref 36.0–46.0)
HEMATOCRIT: 39.7 % (ref 35.0–47.0)
HEMOGLOBIN: 13.2 g/dL (ref 12.0–16.0)
Hemoglobin: 13.3 g/dL (ref 12.0–15.0)
IMMATURE GRANULOCYTES: 0 %
Lymphocytes Relative: 27 %
Lymphocytes Relative: 31 %
Lymphs Abs: 1.9 10*3/uL (ref 1.0–3.6)
Lymphs Abs: 2.3 10*3/uL (ref 0.7–4.0)
MCH: 29.9 pg (ref 26.0–34.0)
MCH: 29.8 pg (ref 26.0–34.0)
MCHC: 33.1 g/dL (ref 32.0–36.0)
MCHC: 32 g/dL (ref 30.0–36.0)
MCV: 90.1 fL (ref 80.0–100.0)
MCV: 93 fL (ref 78.0–100.0)
MONOS PCT: 6 %
Monocytes Absolute: 0.4 10*3/uL (ref 0.2–0.9)
Monocytes Absolute: 0.5 10*3/uL (ref 0.1–1.0)
Monocytes Relative: 7 %
NEUTROS PCT: 62 %
NEUTROS PCT: 57 %
Neutro Abs: 4.2 10*3/uL (ref 1.4–6.5)
Neutro Abs: 4.3 10*3/uL (ref 1.7–7.7)
Platelets: 161 10*3/uL (ref 150–440)
Platelets: 175 10*3/uL (ref 150–400)
RBC: 4.41 MIL/uL (ref 3.80–5.20)
RBC: 4.46 MIL/uL (ref 3.87–5.11)
RDW: 12.8 % (ref 11.5–14.5)
RDW: 12.4 % (ref 11.5–15.5)
WBC: 6.9 10*3/uL (ref 3.6–11.0)
WBC: 7.5 10*3/uL (ref 4.0–10.5)

## 2018-06-21 LAB — COMPREHENSIVE METABOLIC PANEL
ALBUMIN: 4.4 g/dL (ref 3.5–5.0)
ALBUMIN: 4.3 g/dL (ref 3.5–5.0)
ALK PHOS: 79 U/L (ref 38–126)
ALT: 27 U/L (ref 0–44)
ALT: 27 U/L (ref 0–44)
ANION GAP: 13 (ref 5–15)
AST: 26 U/L (ref 15–41)
AST: 25 U/L (ref 15–41)
Alkaline Phosphatase: 77 U/L (ref 38–126)
Anion gap: 10 (ref 5–15)
BUN: 16 mg/dL (ref 6–20)
BUN: 13 mg/dL (ref 6–20)
CALCIUM: 9.6 mg/dL (ref 8.9–10.3)
CHLORIDE: 110 mmol/L (ref 98–111)
CO2: 23 mmol/L (ref 22–32)
CO2: 24 mmol/L (ref 22–32)
Calcium: 9.9 mg/dL (ref 8.9–10.3)
Chloride: 107 mmol/L (ref 98–111)
Creatinine, Ser: 0.93 mg/dL (ref 0.44–1.00)
Creatinine, Ser: 1.02 mg/dL — ABNORMAL HIGH (ref 0.44–1.00)
GFR calc Af Amer: >60 mL/min (ref >60)
GFR calc non Af Amer: >60 mL/min (ref >60)
GFR calc non Af Amer: >60 mL/min (ref >60)
Glucose, Bld: 96 mg/dL (ref 70–99)
Glucose, Bld: 101 mg/dL — ABNORMAL HIGH (ref 70–99)
POTASSIUM: 3.6 mmol/L (ref 3.5–5.1)
Potassium: 3.6 mmol/L (ref 3.5–5.1)
SODIUM: 143 mmol/L (ref 135–145)
Sodium: 144 mmol/L (ref 135–145)
TOTAL PROTEIN: 7.3 g/dL (ref 6.5–8.1)
Total Bilirubin: 0.5 mg/dL (ref 0.3–1.2)
Total Bilirubin: 0.6 mg/dL (ref 0.3–1.2)
Total Protein: 7.1 g/dL (ref 6.5–8.1)

## 2018-06-21 LAB — URINALYSIS, COMPLETE (UACMP) WITH MICROSCOPIC
Bilirubin Urine: NEGATIVE
Glucose, UA: NEGATIVE mg/dL
KETONES UR: NEGATIVE mg/dL
LEUKOCYTES UA: NEGATIVE
Nitrite: NEGATIVE
PROTEIN: NEGATIVE mg/dL
Specific Gravity, Urine: >1.03 — ABNORMAL HIGH (ref 1.005–1.030)
pH: 5 (ref 5.0–8.0)

## 2018-06-21 LAB — LIPASE, BLOOD: Lipase: 36 U/L (ref 11–51)

## 2018-06-21 MED ORDER — ONDANSETRON 8 MG PO TBDP
8.0000 mg | ORAL_TABLET | Freq: Once | ORAL | Status: AC
  Administered 2018-06-21: 8 mg via ORAL

## 2018-06-21 MED ORDER — IOHEXOL 300 MG/ML  SOLN
100.0000 mL | Freq: Once | INTRAMUSCULAR | Status: AC | PRN
  Administered 2018-06-21: 100 mL via INTRAVENOUS

## 2018-06-21 NOTE — ED Notes (Signed)
Patient unable to void at this time.  Urine was however collected at [facility] and results are in the chart.

## 2018-06-21 NOTE — Discharge Instructions (Addendum)
Go directly to Emergency room as discussed. Do not eat or drink.

## 2018-06-21 NOTE — ED Triage Notes (Signed)
Reports RLQ pain for the last three weeks that's gotten worse the last 24 hours.  Also endorses n/v.  [facility].  Sent here to r/o appendicitis.

## 2018-06-21 NOTE — ED Provider Notes (Addendum)
[facility] ____________________________________________  Time seen: Approximately 7:51 PM  I have reviewed the triage vital signs and the nursing notes.   HISTORY  Chief Complaint Abdominal Pain   HPI [name] is a 57 y.o. female past medical history of kidney stones, gallstone induced pancreatitis with cholecystectomy, irritable bowel syndrome presenting for evaluation of right-sided abdominal pain.  Patient states abdominal pain has been present sporadically over the last 2 weeks, but states she usually had some pain even briefly  every day.  Patient reports for the last 2 days she has had constant pain with strongly increased pain today.  States pain is remained a 10 out of 10 today, nonradiating and stays focused in the right lower abdomen.  Some accompanying nausea, decreased appetite.  No vomiting or fever.  Continues with normal bowel habits and normal urination.  States does not feel consistent with her previous kidney stones.  Also history of partial hysterectomy.  States lifting right leg and walking increases pain, rest slightly helps but does not resolve the pain.   denies other aggravating or alleviating factors.  Last ate small amount of food at lunchtime.  Denies other aggravating alleviating factors.  Denies chest pain, shortness of breath, dysuria, extremity pain, or rash. Denies recent sickness. Denies recent antibiotic use.   Denita Lung, MD: PCP   Past Medical History:  Diagnosis Date  . Anxiety   . Decreased libido    on depo testosterone injections from GYN (Dr. Stann Mainland at [facility] 04/2015)  . Depression   . Hemorrhoids   . History of colon polyps    TUBULAR ADENOMA  . History of kidney stones   . History of pancreatitis    2001  . Hydronephrosis, right   . IBS (irritable bowel syndrome)   . Medullary sponge kidney   . Migraine   . Mild intermittent asthma    NO INHALER  . Pleural fibrosis    MILD SUBPLEURAL FIBROSIS-- LAST CT 2011--   PER PULMOLOGIST NOTE DR SOOD 2011    Patient Active Problem List   Diagnosis Date Noted  . Hx of adenomatous colonic polyps 04/25/2017  . Diverticulosis of large intestine without hemorrhage 04/25/2017  . Migraine 04/25/2017  . Rheumatoid arthritis involving both hands with positive rheumatoid factor ([place]) 11/07/2016  . Obesity due to excess calories 09/05/2016  . Calculus of kidney 07/13/2014  . Depression 10/24/2011  . Marital conflict 58/52/7782  . Reactive airway disease 04/29/2010  . INTERSTITIAL LUNG DISEASE 04/29/2010    Past Surgical History:  Procedure Laterality Date  . ABDOMINAL HYSTERECTOMY  1996  . CESAREAN SECTION  X2  . COLONOSCOPY W/ POLYPECTOMY  2004  &  01-16-2011  . CYSTOSCOPY WITH RETROGRADE PYELOGRAM, URETEROSCOPY AND STENT PLACEMENT Right 06/18/2014   Procedure: CYSTOSCOPY WITH RETROGRADE PYELOGRAM,  STENT PLACEMENT;  Surgeon: Arvil Persons, MD;  Location: [facility];  Service: Urology;  Laterality: Right;  . CYSTOSCOPY/RETROGRADE/URETEROSCOPY Right 07/13/2014   Procedure: CYSTOSCOPY/RETROGRADE/RIGHT URETEROSCOPY AND REMOVAL OF RIGHT STENT;  Surgeon: Jorja Loa, MD;  Location: [facility];  Service: Urology;  Laterality: Right;  . CYSTOSCOPY/RETROGRADE/URETEROSCOPY/STONE EXTRACTION WITH BASKET Right 06/08/2014   Procedure: CYSTOSCOPY RIGHT RETROGRADE/URETEROSCOPY/STONE EXTRACTION WITH BASKET WITH STENT PLACEMENT;  Surgeon: Jorja Loa, MD;  Location: [facility];  Service: Urology;  Laterality: Right;  . ERCP  06/2000  . EXTRACORPOREAL SHOCK WAVE LITHOTRIPSY Right 12-24-2012  . HOLMIUM LASER APPLICATION Right 03/19/5360   Procedure:  HOLMIUM LASER APPLICATION;  Surgeon:  Jorja Loa, MD;  Location: [facility];  Service: Urology;  Laterality: Right;  . HOLMIUM LASER APPLICATION Right 6/44/0347   Procedure: HOLMIUM LASER APPLICATION;  Surgeon: Jorja Loa, MD;  Location: [facility];  Service: Urology;   Laterality: Right;  . LAPAROSCOPIC CHOLECYSTECTOMY  [phone]  . PARTIAL NEPHRECTOMY Bilateral LEFT  05-18-2006/   RIGHT 1998   LEFT (ANGIOMYOLIPOMA)   RIGHT (BENIGN TUMOR)  . TUBAL LIGATION  1993     No current facility-administered medications for this encounter.   Current Outpatient Medications:  .  aspirin 81 MG chewable tablet, Chew 81 mg by mouth daily., Disp: , Rfl:  .  DULoxetine (CYMBALTA) 60 MG capsule, Take 60 mg by mouth every morning. , Disp: , Rfl:  .  estradiol (ESTRACE) 2 MG tablet, Take 2 mg by mouth daily., Disp: , Rfl:  .  hydroxychloroquine (PLAQUENIL) 200 MG tablet, Take 200 mg by mouth 2 (two) times daily., Disp: , Rfl:  .  leflunomide (ARAVA) 10 MG tablet, Take 10 mg by mouth daily., Disp: , Rfl:  .  lisinopril-hydrochlorothiazide (PRINZIDE,ZESTORETIC) 10-12.5 MG tablet, TAKE 1 TABLET BY MOUTH DAILY, Disp: 90 tablet, Rfl: 1 .  spironolactone (ALDACTONE) 25 MG tablet, Take 25 mg by mouth daily., Disp: , Rfl:  .  topiramate (TOPAMAX) 100 MG tablet, Take 100 mg by mouth daily. This is a capsul, Disp: , Rfl:  .  VITAMIN D, CHOLECALCIFEROL, PO, Take 1,200 Units by mouth daily., Disp: , Rfl:  .  zolpidem (AMBIEN) 10 MG tablet, Take 10 mg by mouth at bedtime as needed for sleep., Disp: , Rfl:  .  FLUoxetine (PROZAC) 10 MG capsule, Take 1 capsule (10 mg total) by mouth daily., Disp: 30 capsule, Rfl: 3 .  HYDROcodone-acetaminophen (NORCO/VICODIN) 5-325 MG tablet, Take 1 tablet by mouth every 6 (six) hours as needed., Disp: 10 tablet, Rfl: 0 .  PROAIR HFA 108 (90 Base) MCG/ACT inhaler, INHALE 2 PUFFS INTO THE LUNGS EVERY 6 HOURS AS NEEDED FOR WHEEZING OR SHORTNESS OF BREATH, Disp: 8.5 g, Rfl: 0  Allergies Penicillins  Family History  Problem Relation Age of Onset  . Cancer Father     Social History Social History   Tobacco Use  . Smoking status: Never Smoker  . Smokeless tobacco: Never Used  Substance Use Topics  . Alcohol use: No  . Drug use: No    Review  of Systems Constitutional: No fever Cardiovascular: Denies chest pain. Respiratory: Denies shortness of breath. Gastrointestinal: As above.  Genitourinary: Negative for dysuria. Musculoskeletal: Negative for back pain. Skin: Negative for rash.   ____________________________________________   PHYSICAL EXAM:  VITAL SIGNS: ED Triage Vitals  Enc Vitals Group     BP 06/21/18 1840 (!) 150/121     Pulse Rate 06/21/18 1840 70     Resp 06/21/18 1840 18     Temp 06/21/18 1840 98.4 F (36.9 C)     Temp Source 06/21/18 1840 Oral     SpO2 06/21/18 1840 100 %     Weight --      Height --      Head Circumference --      Peak Flow --      Pain Score 06/21/18 1844 10     Pain Loc --      Pain Edu? --      Excl. in [place]? --     Constitutional: Alert and oriented.  Patient holding abdomen and appears uncomfortable.   ENT  Head: Normocephalic and atraumatic.. Cardiovascular: Normal rate, regular rhythm. Grossly normal heart sounds.  Good peripheral circulation. Respiratory: Normal respiratory effort without tachypnea nor retractions. Breath sounds are clear and equal bilaterally. No wheezes, rales, rhonchi. Gastrointestinal: Moderate tenderness to direct palpation right lower quadrant right mid and upper abdomen mild tenderness to direct palpation, tenderness at direct McBurney's point, abdomen otherwise soft and nontender.  Normal bowel sounds.  Nondistended.  No CVA tenderness. Musculoskeletal:  No midline cervical, thoracic or lumbar tenderness to palpation.  Neurologic:  Normal speech and language. Speech is normal. No gait instability.  Skin:  Skin is warm, dry and intact. No rash noted. Psychiatric: Mood and affect are normal. Speech and behavior are normal. Patient exhibits appropriate insight and judgment   ___________________________________________   LABS (all labs ordered are listed, but only abnormal results are displayed)  Labs Reviewed  URINALYSIS, COMPLETE (UACMP)  WITH MICROSCOPIC - Abnormal; Notable for the following components:      Result Value   Specific Gravity, Urine >1.030 (*)    Hgb urine dipstick MODERATE (*)    Bacteria, UA FEW (*)    All other components within normal limits  URINE CULTURE  CBC WITH DIFFERENTIAL/PLATELET  COMPREHENSIVE METABOLIC PANEL  LIPASE, BLOOD   ____________________________________________   PROCEDURES Procedures    INITIAL IMPRESSION / ASSESSMENT AND PLAN / ED COURSE  Pertinent labs & imaging results that were available during my care of the patient were reviewed by me and considered in my medical decision making (see chart for details).  Presenting for right-sided abdominal pain.  Previous kidney stones and cholecystectomy.  Also history of pancreatitis prior to cholecystectomy.  Discussed concern for obstructing stone versus appendicitis.  Patient urinalysis reviewed, and patient does report normally some blood in urine.  By exam highly concerned for appendicitis.  Repeat recommend further evaluation with imaging at this time the emergency room.  Will initiate lab studies as patient reports she will be going to [facility] ER in [place].  Denies CMA to call report.  Patient states that her sister will be taking her.  Patient declines EMS transport.  Patient stable at time of discharge.   8mg  ODT Zofran given in urgent care.  Lab results as above discussed with patient via telephone.   ____________________________________________   FINAL CLINICAL IMPRESSION(S) / ED DIAGNOSES  Final diagnoses:  Right lower quadrant abdominal pain     ED Discharge Orders    None       Note: This dictation was prepared with Dragon dictation along with smaller phrase technology. Any transcriptional errors that result from this process are unintentional.         Marylene Land, NP 06/21/18 2004    Marylene Land, NP 06/21/18 2006

## 2018-06-21 NOTE — ED Provider Notes (Signed)
Patient placed in Quick Look pathway, seen and evaluated   Chief Complaint: Abdominal pain  HPI: Patient presents with complaint of right lower quadrant abdominal pain that has been mild intermittent for the past 3 weeks however over the past day it has become much worse.  Patient went to the urgent care in [place].  She has had lack of appetite and nausea.  She was referred here for CT of the abdomen and pelvis to rule out appendicitis.  She denies any urinary symptoms.  She is having normal bowel movements.  She has a history of a cholecystectomy and several surgeries to remove tumors from her kidneys.  ROS:  Positive ROS: (+) Abdominal pain, anorexia Negative ROS: (-) Fever, diarrhea  Physical Exam:   Gen: No distress  Neuro: Awake and Alert  Skin: Warm    Focused Exam: Heart RRR, nml S1,S2, no m/r/g; Lungs CTAB; soft, right upper, periumbilical and right lower quadrant abdominal pain to palpation, no rebound or guarding; Ext 2+ pedal pulses bilaterally, no edema.  BP 139/83 (BP Location: Right Arm)   Pulse 73   Temp 97.9 F (36.6 C) (Oral)   Resp 18   Ht 5\' 5"  (1.651 m)   Wt 78 kg (172 lb)   SpO2 100%   BMI 28.62 kg/m   Plan: Labs, UA, CT.  Initiation of care has begun. The patient has been counseled on the process, plan, and necessity for staying for the completion/evaluation, and the remainder of the medical screening examination    [name] 06/21/18 2052    Lajean Saver, MD 06/21/18 2228

## 2018-06-21 NOTE — ED Triage Notes (Signed)
Pt here for abdominal pain in her RQ and states it feels like a kidney stone but also has a hx of pancreatitis. No other symptoms reported, worse when she stands up and has been consistent for the past few hours. Has felt the pain for the past few weeks in tinges but now it's constant. Taking antacids without relief, originally thought it was gas. Pain doesn't radiate

## 2018-06-21 NOTE — ED Notes (Signed)
Nurse started IV and collected labs.

## 2018-06-22 MED ORDER — ONDANSETRON HCL 4 MG PO TABS: 4.0000 mg | ORAL_TABLET | Freq: Four times a day (QID) | ORAL | 0 refills | Status: DC | PRN

## 2018-06-22 MED ORDER — TRAMADOL HCL 50 MG PO TABS
50.0000 mg | ORAL_TABLET | Freq: Once | ORAL | Status: AC
  Administered 2018-06-22: 50 mg via ORAL
  Filled 2018-06-22: qty 1

## 2018-06-22 MED ORDER — TRAMADOL HCL 50 MG PO TABS: 50.0000 mg | ORAL_TABLET | Freq: Four times a day (QID) | ORAL | 0 refills | Status: DC | PRN

## 2018-06-22 NOTE — ED Provider Notes (Signed)
[facility] EMERGENCY DEPARTMENT Provider Note   CSN: [id number] Arrival date & time: 06/21/18  2037     History   Chief Complaint Chief Complaint  Patient presents with  . Abdominal Pain    HPI [name] is a 57 y.o. female.  The history is provided by the patient.  She has history of kidney stones, rheumatoid arthritis, interstitial lung disease and comes in with a 2-3-week history of pain in the right mid abdomen.  Pain is intermittent, but has been coming more often and has been getting worse.  She rates pain at 7/10.  There is associated nausea and vomiting.  Pain is worse with movement.  Nothing makes it better.  She denies fever or chills.  She denies constipation or diarrhea.  She denies any urinary difficulty.  She has noticed decreased appetite.  She went to an urgent care center today and was referred here for CT scan to evaluate for possible appendicitis.  Past Medical History:  Diagnosis Date  . Anxiety   . Decreased libido    on depo testosterone injections from GYN (Dr. Stann Mainland at [facility] 04/2015)  . Depression   . Hemorrhoids   . History of colon polyps    TUBULAR ADENOMA  . History of kidney stones   . History of pancreatitis    2001  . Hydronephrosis, right   . IBS (irritable bowel syndrome)   . Medullary sponge kidney   . Migraine   . Mild intermittent asthma    NO INHALER  . Pleural fibrosis    MILD SUBPLEURAL FIBROSIS-- LAST CT 2011--  PER PULMOLOGIST NOTE DR SOOD 2011    Patient Active Problem List   Diagnosis Date Noted  . Hx of adenomatous colonic polyps 04/25/2017  . Diverticulosis of large intestine without hemorrhage 04/25/2017  . Migraine 04/25/2017  . Rheumatoid arthritis involving both hands with positive rheumatoid factor ([place]) 11/07/2016  . Obesity due to excess calories 09/05/2016  . Calculus of kidney 07/13/2014  . Depression 10/24/2011  . Marital conflict 22/97/9892  . Reactive airway disease 04/29/2010  .  INTERSTITIAL LUNG DISEASE 04/29/2010    Past Surgical History:  Procedure Laterality Date  . ABDOMINAL HYSTERECTOMY  1996  . CESAREAN SECTION  X2  . COLONOSCOPY W/ POLYPECTOMY  2004  &  01-16-2011  . CYSTOSCOPY WITH RETROGRADE PYELOGRAM, URETEROSCOPY AND STENT PLACEMENT Right 06/18/2014   Procedure: CYSTOSCOPY WITH RETROGRADE PYELOGRAM,  STENT PLACEMENT;  Surgeon: Arvil Persons, MD;  Location: [facility];  Service: Urology;  Laterality: Right;  . CYSTOSCOPY/RETROGRADE/URETEROSCOPY Right 07/13/2014   Procedure: CYSTOSCOPY/RETROGRADE/RIGHT URETEROSCOPY AND REMOVAL OF RIGHT STENT;  Surgeon: Jorja Loa, MD;  Location: [facility];  Service: Urology;  Laterality: Right;  . CYSTOSCOPY/RETROGRADE/URETEROSCOPY/STONE EXTRACTION WITH BASKET Right 06/08/2014   Procedure: CYSTOSCOPY RIGHT RETROGRADE/URETEROSCOPY/STONE EXTRACTION WITH BASKET WITH STENT PLACEMENT;  Surgeon: Jorja Loa, MD;  Location: [facility];  Service: Urology;  Laterality: Right;  . ERCP  06/2000  . EXTRACORPOREAL SHOCK WAVE LITHOTRIPSY Right 12-24-2012  . HOLMIUM LASER APPLICATION Right 12/15/4172   Procedure:  HOLMIUM LASER APPLICATION;  Surgeon: Jorja Loa, MD;  Location: [facility];  Service: Urology;  Laterality: Right;  . HOLMIUM LASER APPLICATION Right 0/81/4481   Procedure: HOLMIUM LASER APPLICATION;  Surgeon: Jorja Loa, MD;  Location: [facility];  Service: Urology;  Laterality: Right;  . LAPAROSCOPIC CHOLECYSTECTOMY  [phone]  . PARTIAL NEPHRECTOMY Bilateral LEFT  05-18-2006/   RIGHT 1998  LEFT (ANGIOMYOLIPOMA)   RIGHT (BENIGN TUMOR)  . TUBAL LIGATION  1993     OB History   None      Home Medications    Prior to Admission medications   Medication Sig Start Date End Date Taking? Authorizing Provider  aspirin 81 MG chewable tablet Chew 81 mg by mouth daily.   Yes [provider]  DULoxetine (CYMBALTA) 60 MG capsule Take 60 mg by mouth  daily.    Yes [provider]  estradiol (ESTRACE) 2 MG tablet Take 2 mg by mouth daily.   Yes [provider]  hydroxychloroquine (PLAQUENIL) 200 MG tablet Take 200 mg by mouth 2 (two) times daily.   Yes [provider]  leflunomide (ARAVA) 10 MG tablet Take 10 mg by mouth daily.   Yes [provider]  lisinopril-hydrochlorothiazide (PRINZIDE,ZESTORETIC) 10-12.5 MG tablet TAKE 1 TABLET BY MOUTH DAILY 06/21/18  Yes Denita Lung, MD  PROAIR HFA 108 [phone] Base) MCG/ACT inhaler INHALE 2 PUFFS INTO THE LUNGS EVERY 6 HOURS AS NEEDED FOR WHEEZING OR SHORTNESS OF BREATH 08/02/17  Yes Denita Lung, MD  spironolactone (ALDACTONE) 25 MG tablet Take 25 mg by mouth daily.   Yes [provider]  TROKENDI XR 100 MG CP24 Take 100 mg by mouth daily. 05/22/18  Yes [provider]  VITAMIN D, CHOLECALCIFEROL, PO Take 1,200 Units by mouth daily.   Yes [provider]  zolpidem (AMBIEN) 10 MG tablet Take 10 mg by mouth at bedtime as needed for sleep.   Yes [provider]  FLUoxetine (PROZAC) 10 MG capsule Take 1 capsule (10 mg total) by mouth daily. Patient not taking: Reported on 06/22/2018 04/08/18   Denita Lung, MD  HYDROcodone-acetaminophen (NORCO/VICODIN) 5-325 MG tablet Take 1 tablet by mouth every 6 (six) hours as needed. Patient not taking: Reported on 06/22/2018 04/13/18   Coral Spikes, DO    Family History Family History  Problem Relation Age of Onset  . Cancer Father     Social History Social History   Tobacco Use  . Smoking status: Never Smoker  . Smokeless tobacco: Never Used  Substance Use Topics  . Alcohol use: No  . Drug use: No     Allergies   Penicillins   Review of Systems Review of Systems  All other systems reviewed and are negative.    Physical Exam Updated Vital Signs BP 135/84   Pulse 71   Temp 97.9 F (36.6 C) (Oral)   Resp 16   Ht 5\' 5"  (1.651 m)   Wt 78 kg (172 lb)   SpO2 100%   BMI  28.62 kg/m   Physical Exam  Nursing note and vitals reviewed.  57 year old female, resting comfortably and in no acute distress. Vital signs are normal. Oxygen saturation is 100%, which is normal. Head is normocephalic and atraumatic. PERRLA, EOMI. Oropharynx is clear. Neck is nontender and supple without adenopathy or JVD. Back is nontender and there is no CVA tenderness. Lungs are clear without rales, wheezes, or rhonchi. Chest is nontender. Heart has regular rate and rhythm without murmur. Abdomen is soft, flat, with moderate tenderness well localized to the right mid abdomen.  There is no rebound or guarding.  There are no masses or hepatosplenomegaly and peristalsis is normoactive. Extremities have no cyanosis or edema, full range of motion is present. Skin is warm and dry without rash. Neurologic: Mental status is normal, cranial nerves are intact, there are no motor  or sensory deficits.  ED Treatments / Results  Labs (all labs ordered are listed, but only abnormal results are displayed) Labs Reviewed  COMPREHENSIVE METABOLIC PANEL - Abnormal; Notable for the following components:      Result Value   Glucose, Bld 101 (*)    Creatinine, Ser 1.02 (*)    All other components within normal limits  CBC WITH DIFFERENTIAL/PLATELET   Radiology Ct Abdomen Pelvis W Contrast  Result Date: 06/21/2018 CLINICAL DATA:  Acute right lower quadrant abdominal pain. EXAM: CT ABDOMEN AND PELVIS WITH CONTRAST TECHNIQUE: Multidetector CT imaging of the abdomen and pelvis was performed using the standard protocol following bolus administration of intravenous contrast. CONTRAST:  137mL OMNIPAQUE IOHEXOL 300 MG/ML  SOLN COMPARISON:  CT scan of September 13, 2017. FINDINGS: Lower chest: No acute abnormality. Hepatobiliary: No focal liver abnormality is seen. Status post cholecystectomy. No biliary dilatation. Pancreas: Unremarkable. No pancreatic ductal dilatation or surrounding inflammatory changes.  Spleen: Normal in size without focal abnormality. Adrenals/Urinary Tract: Adrenal glands appear normal. Status post partial left nephrectomy. 11 x 5 mm nonobstructive calculus is noted in lower pole collecting system. Nonobstructive calculus is seen in upper pole collecting system of right kidney. No hydronephrosis or renal obstruction is noted. Urinary bladder is unremarkable. Stomach/Bowel: Stomach is within normal limits. Appendix appears normal. No evidence of bowel wall thickening, distention, or inflammatory changes. Sigmoid diverticulosis is noted without inflammation. Vascular/Lymphatic: No significant vascular findings are present. No enlarged abdominal or pelvic lymph nodes. Reproductive: Status post hysterectomy. No adnexal masses. Other: No abdominal wall hernia or abnormality. No abdominopelvic ascites. Musculoskeletal: No acute or significant osseous findings. IMPRESSION: Bilateral nonobstructive nephrolithiasis. Status post partial left nephrectomy. No hydronephrosis or renal obstruction is noted. Sigmoid diverticulosis is noted without inflammation. No acute abnormality seen in the abdomen or pelvis. Electronically Signed   By: Marijo Conception, M.D.   On: 06/21/2018 21:49    Procedures Procedures  Medications Ordered in ED Medications  iohexol (OMNIPAQUE) 300 MG/ML solution 100 mL (100 mLs Intravenous Contrast Given 06/21/18 2121)  traMADol (ULTRAM) tablet 50 mg (50 mg Oral Given 06/22/18 0228)     Initial Impression / Assessment and Plan / ED Course  I have reviewed the triage vital signs and the nursing notes.  Pertinent labs & imaging results that were available during my care of the patient were reviewed by me and considered in my medical decision making (see chart for details).  Abdominal pain of uncertain cause.  Old records are reviewed confirming urgent care visit earlier today.  Laboratory work-up is unremarkable.  CT shows no acute process.  Specifically, no evidence of  appendicitis.  Cause of pain is unclear.  She has a gastroenterologist, and is referred to him for further outpatient work-up.  She is given prescriptions for tramadol and ondansetron.  Return precautions discussed.  Final Clinical Impressions(s) / ED Diagnoses   Final diagnoses:  Right sided abdominal pain    ED Discharge Orders        Ordered    traMADol (ULTRAM) 50 MG tablet  Every 6 hours PRN     06/22/18 0229    ondansetron (ZOFRAN) 4 MG tablet  Every 6 hours PRN     21/30/86 5784       Delora Fuel, MD 69/62/95 [phone]

## 2018-06-22 NOTE — Discharge Instructions (Signed)
Return if pain gets worse, or if you start running a fever.

## 2018-06-22 NOTE — ED Notes (Signed)
Patient verbalizes understanding of discharge instructions. Opportunity for questioning and answers were provided. Armband removed by staff, pt discharged from ED ambulatory.   

## 2018-06-22 NOTE — ED Notes (Signed)
Patient ambulatory to room with steady gait at this time. No acute distress

## 2018-06-22 NOTE — ED Notes (Signed)
Abdominal pain increases with activity

## 2018-06-23 LAB — URINE CULTURE

## 2018-07-02 DIAGNOSIS — Z01419 Encounter for gynecological examination (general) (routine) without abnormal findings: Secondary | ICD-10-CM | POA: Diagnosis not present

## 2018-07-02 LAB — HM PAP SMEAR

## 2018-07-03 LAB — HM PAP SMEAR

## 2018-07-03 LAB — HM MAMMOGRAPHY

## 2018-07-08 ENCOUNTER — Telehealth: Payer: Self-pay | Admitting: Family Medicine

## 2018-07-08 MED ORDER — TROKENDI XR 100 MG PO CP24: 100.0000 mg | ORAL_CAPSULE | Freq: Every day | ORAL | 5 refills | Status: DC

## 2018-07-08 MED ORDER — DULOXETINE HCL 60 MG PO CPEP: 60.0000 mg | ORAL_CAPSULE | Freq: Every day | ORAL | 5 refills | Status: DC

## 2018-07-08 MED ORDER — TIZANIDINE HCL 4 MG PO TABS: ORAL_TABLET | ORAL | 0 refills | Status: DC

## 2018-07-08 NOTE — Telephone Encounter (Signed)
Pt says Dr. Redmond School agreed to take over maintaining refills on meds from her previous doctor. Pt requesting refills on Trokendi 100 mg 1 per day #30, Duloxetine 60 mg 1 twice a day #60, Tizanidine 4 mg 1 - 1 1/2 at bedtime  #45

## 2018-07-11 DIAGNOSIS — D485 Neoplasm of uncertain behavior of skin: Secondary | ICD-10-CM | POA: Diagnosis not present

## 2018-07-11 DIAGNOSIS — R239 Unspecified skin changes: Secondary | ICD-10-CM | POA: Diagnosis not present

## 2018-07-11 DIAGNOSIS — H61002 Unspecified perichondritis of left external ear: Secondary | ICD-10-CM | POA: Diagnosis not present

## 2018-07-11 DIAGNOSIS — D2272 Melanocytic nevi of left lower limb, including hip: Secondary | ICD-10-CM | POA: Diagnosis not present

## 2018-07-17 DIAGNOSIS — R5382 Chronic fatigue, unspecified: Secondary | ICD-10-CM | POA: Diagnosis not present

## 2018-07-17 DIAGNOSIS — M2550 Pain in unspecified joint: Secondary | ICD-10-CM | POA: Diagnosis not present

## 2018-07-17 DIAGNOSIS — Z79899 Other long term (current) drug therapy: Secondary | ICD-10-CM | POA: Diagnosis not present

## 2018-07-17 DIAGNOSIS — M0579 Rheumatoid arthritis with rheumatoid factor of multiple sites without organ or systems involvement: Secondary | ICD-10-CM | POA: Diagnosis not present

## 2018-08-05 ENCOUNTER — Other Ambulatory Visit: Payer: Self-pay | Admitting: Family Medicine

## 2018-08-05 NOTE — Telephone Encounter (Signed)
[facility] is requesting to fill pt tizanidine. Please advise [facility]

## 2018-09-10 ENCOUNTER — Telehealth: Payer: Self-pay

## 2018-09-10 NOTE — Telephone Encounter (Signed)
I need to know the kind of meningitis she was exposed to.

## 2018-09-10 NOTE — Telephone Encounter (Signed)
Patient called and stated she was expose to bacterial meningitis at a birthday party. She's not currently having any symptoms but the CDC called and told her and others that attended the party to get a prescription from their doctor. Patient noted she has rheumatoid arthritis which puts her more at risk. Please advise.

## 2018-09-11 NOTE — Telephone Encounter (Signed)
Called pt to advise of need more info about what type of meningitis she was exposed to. LVM to call office back. [place]

## 2018-10-03 ENCOUNTER — Ambulatory Visit: Payer: 59 | Admitting: Family Medicine

## 2018-10-03 ENCOUNTER — Encounter: Payer: Self-pay | Admitting: Family Medicine

## 2018-10-03 VITALS — BP 128/82 | HR 61 | Temp 97.7°F | Wt 190.0 lb

## 2018-10-03 DIAGNOSIS — J069 Acute upper respiratory infection, unspecified: Secondary | ICD-10-CM

## 2018-10-03 DIAGNOSIS — Z23 Encounter for immunization: Secondary | ICD-10-CM | POA: Diagnosis not present

## 2018-10-03 DIAGNOSIS — Z79899 Other long term (current) drug therapy: Secondary | ICD-10-CM | POA: Diagnosis not present

## 2018-10-03 DIAGNOSIS — F329 Major depressive disorder, single episode, unspecified: Secondary | ICD-10-CM | POA: Diagnosis not present

## 2018-10-03 DIAGNOSIS — B9789 Other viral agents as the cause of diseases classified elsewhere: Secondary | ICD-10-CM

## 2018-10-03 DIAGNOSIS — M05741 Rheumatoid arthritis with rheumatoid factor of right hand without organ or systems involvement: Secondary | ICD-10-CM

## 2018-10-03 DIAGNOSIS — F32A Depression, unspecified: Secondary | ICD-10-CM

## 2018-10-03 DIAGNOSIS — M05742 Rheumatoid arthritis with rheumatoid factor of left hand without organ or systems involvement: Secondary | ICD-10-CM

## 2018-10-03 MED ORDER — FLUOXETINE HCL 20 MG PO TABS: 20.0000 mg | ORAL_TABLET | Freq: Every day | ORAL | 3 refills | Status: DC

## 2018-10-03 NOTE — Progress Notes (Signed)
   Subjective:    Patient ID: [name], female    DOB: [birth year], 57 y.o.   MRN: [id number]  HPI She is here for consult concerning multiple issues.  She does have underlying depression and is taking Cymbalta.  She was given 10 mg of Prozac, took it for a month and states that it did not help but did not call me concerning this.  She also has rheumatoid arthritis.  She did finish steroids a week or so ago and is on other immunomodulating medications at the present time.  She will need immunizations updated.  She also complains of a 1 day history of sore throat, chest congestion and dry cough with left earache.  No fever, chills, shortness of breath, malaise or myalgias.  Apparently her daughter was diagnosed with the flu.   Review of Systems     Objective:   Physical Exam Alert and in no distress, flat demeanor noted.. Tympanic membranes and canals are normal. Pharyngeal area is normal. Neck is supple without adenopathy or thyromegaly. Cardiac exam shows a regular sinus rhythm without murmurs or gallops. Lungs are clear to auscultation.        Assessment & Plan:  Need for vaccination against Streptococcus pneumoniae - Plan: Pneumococcal conjugate vaccine 13-valent  Need for influenza vaccination - Plan: Flu Vaccine QUAD 6+ mos PF IM (Fluarix Quad PF)  Viral URI with cough  Rheumatoid arthritis involving both hands with positive rheumatoid factor ([place])  Depression, unspecified depression type - Plan: FLUoxetine (PROZAC) 20 MG tablet  Encounter for long-term (current) use of high-risk medication Explained that she has URI to treat symptoms however if they get worse towards the end of the week, she will let me know. Her immunizations were updated. I will increase her Prozac to 20 mg and have her return here in 1 month.  Also discussed counseling however she is not interested in that.

## 2018-10-11 DIAGNOSIS — M7671 Peroneal tendinitis, right leg: Secondary | ICD-10-CM | POA: Diagnosis not present

## 2018-10-11 DIAGNOSIS — M84374A Stress fracture, right foot, initial encounter for fracture: Secondary | ICD-10-CM | POA: Diagnosis not present

## 2018-10-11 DIAGNOSIS — S92214A Nondisplaced fracture of cuboid bone of right foot, initial encounter for closed fracture: Secondary | ICD-10-CM | POA: Diagnosis not present

## 2018-10-17 DIAGNOSIS — M2550 Pain in unspecified joint: Secondary | ICD-10-CM | POA: Diagnosis not present

## 2018-10-17 DIAGNOSIS — S92214D Nondisplaced fracture of cuboid bone of right foot, subsequent encounter for fracture with routine healing: Secondary | ICD-10-CM | POA: Diagnosis not present

## 2018-10-17 DIAGNOSIS — Z79899 Other long term (current) drug therapy: Secondary | ICD-10-CM | POA: Diagnosis not present

## 2018-10-17 DIAGNOSIS — R5382 Chronic fatigue, unspecified: Secondary | ICD-10-CM | POA: Diagnosis not present

## 2018-10-17 DIAGNOSIS — M0579 Rheumatoid arthritis with rheumatoid factor of multiple sites without organ or systems involvement: Secondary | ICD-10-CM | POA: Diagnosis not present

## 2018-10-29 ENCOUNTER — Other Ambulatory Visit: Payer: Self-pay | Admitting: Rheumatology

## 2018-10-29 DIAGNOSIS — E2839 Other primary ovarian failure: Secondary | ICD-10-CM

## 2018-11-05 ENCOUNTER — Ambulatory Visit: Payer: 59 | Admitting: Family Medicine

## 2018-12-20 DIAGNOSIS — R6882 Decreased libido: Secondary | ICD-10-CM | POA: Diagnosis not present

## 2019-02-14 ENCOUNTER — Telehealth: Payer: Self-pay | Admitting: Family Medicine

## 2019-02-14 NOTE — Telephone Encounter (Signed)
Pt called and needs prescription for Duloxetine transferred to the [facility] on [address]. She can be reached at [phone]

## 2019-02-16 MED ORDER — DULOXETINE HCL 60 MG PO CPEP: 60.0000 mg | ORAL_CAPSULE | Freq: Every day | ORAL | 5 refills | Status: DC

## 2019-04-04 ENCOUNTER — Other Ambulatory Visit: Payer: Self-pay | Admitting: Medical

## 2019-04-04 ENCOUNTER — Telehealth: Payer: Self-pay | Admitting: Family Medicine

## 2019-04-04 MED ORDER — TOPIRAMATE 100 MG PO TABS: 100.0000 mg | ORAL_TABLET | Freq: Two times a day (BID) | ORAL | 0 refills | Status: DC

## 2019-04-04 NOTE — Telephone Encounter (Signed)
I sent Topiramate 100mg  BID which should be similar in dosing and strength.

## 2019-04-04 NOTE — Telephone Encounter (Signed)
Pt called and states she no longer has insurance and the Trokendi cost $1400 for her migraines and she can't afford.  States she was taking Topamax 100mg  before prescribed by Dr. Redmond School and would like to be switched back to that because it is cheap without insurance.  She states she needs ASAP because she is out of her medication.

## 2019-04-04 NOTE — Telephone Encounter (Signed)
done

## 2019-04-29 ENCOUNTER — Other Ambulatory Visit: Payer: Self-pay | Admitting: Medical

## 2019-04-30 NOTE — Telephone Encounter (Signed)
Is this ok to refill?  

## 2019-05-15 DIAGNOSIS — M0579 Rheumatoid arthritis with rheumatoid factor of multiple sites without organ or systems involvement: Secondary | ICD-10-CM | POA: Diagnosis not present

## 2019-05-20 ENCOUNTER — Other Ambulatory Visit: Payer: Self-pay

## 2019-05-20 ENCOUNTER — Ambulatory Visit (INDEPENDENT_AMBULATORY_CARE_PROVIDER_SITE_OTHER): Payer: Medicare Other | Admitting: Family Medicine

## 2019-05-20 ENCOUNTER — Encounter: Payer: Self-pay | Admitting: Family Medicine

## 2019-05-20 VITALS — BP 130/68 | Temp 98.9°F | Wt 184.0 lb

## 2019-05-20 DIAGNOSIS — J0300 Acute streptococcal tonsillitis, unspecified: Secondary | ICD-10-CM | POA: Diagnosis not present

## 2019-05-20 MED ORDER — CLARITHROMYCIN 500 MG PO TABS: 500.0000 mg | ORAL_TABLET | Freq: Two times a day (BID) | ORAL | 0 refills | Status: DC

## 2019-05-20 NOTE — Progress Notes (Signed)
   Subjective:    Patient ID: [name], female    DOB: 01-13-1961, 58 y.o.   MRN: [id number]  HPI Documentation for virtual telephone encounter. Documentation for virtual audio and video telecommunications through doximity encounter: The patient was located at home. The provider was located in the office. The patient did consent to this visit and is aware of possible charges through their insurance for this visit. The other persons participating in this telemedicine service were none. Time spent on call was 5 minutes and in review of previous records >10 minutes total. This virtual service is not related to other E/M service within previous 7 days. She complains of a one-week history of sore throat, fatigue, slight fever, swollen pussy tonsils, tender adenopathy.  Her daughter had strep prior to this.    Review of Systems     Objective:   Physical Exam Alert and in no distress otherwise not examined       Assessment & Plan:   Strep tonsillitis - Plan: clarithromycin (BIAXIN) 500 MG tablet,  Since her daughter had strep is a good possibility that she also has strep and I feel comfortable treating her.

## 2019-05-22 ENCOUNTER — Telehealth: Payer: Self-pay | Admitting: Family Medicine

## 2019-05-22 NOTE — Telephone Encounter (Signed)
Pt called back & states blisters are beginning to hurt, no itching, she hasn't taken benadryl or anything yet she was waiting to hear back.  I advised not to take anymore antibiotic until she hears back from [place].  Please advise

## 2019-05-22 NOTE — Telephone Encounter (Signed)
Pt states woke this morning with like burn marks on her face, now approximately 12 and spreading,  One is 1 1/2 inches long.  Thinks is due to new antibiotic,  no other new meds & nothing else changed,  Been inside due to strep.   She is sending pics to my email.

## 2019-05-22 NOTE — Telephone Encounter (Signed)
Dr. Redmond School reviewed the pictures and I called pt back and advised per Dr. Redmond School, stop Clarithromycin and start Benadryl as directed and cool compresses and call in morning to let him know how she is doing.

## 2019-05-23 MED ORDER — CEFDINIR 300 MG PO CAPS: 300.0000 mg | ORAL_CAPSULE | Freq: Two times a day (BID) | ORAL | 0 refills | Status: DC

## 2019-05-23 NOTE — Telephone Encounter (Signed)
Pt was advise of new med and to keep [place] informed of how she is doing. So far since she has stopped the med no new spots per pt. [place]

## 2019-05-23 NOTE — Telephone Encounter (Signed)
See how she is doing.  Let her know that I switch her to a different medication.  There is a slight chance of cross-reactivity with penicillin in terms of allergies but are not think that she will have a problem.  Have her let me know if there is any trouble.

## 2019-07-30 ENCOUNTER — Other Ambulatory Visit: Payer: Self-pay | Admitting: Family Medicine

## 2019-09-16 ENCOUNTER — Other Ambulatory Visit: Payer: Self-pay | Admitting: Family Medicine

## 2019-09-17 NOTE — Telephone Encounter (Signed)
[facility] is requesting to fill pt topamax. Please advise [facility]

## 2019-10-02 DIAGNOSIS — L258 Unspecified contact dermatitis due to other agents: Secondary | ICD-10-CM | POA: Diagnosis not present

## 2019-10-02 DIAGNOSIS — L0100 Impetigo, unspecified: Secondary | ICD-10-CM | POA: Diagnosis not present

## 2019-10-13 DIAGNOSIS — R5382 Chronic fatigue, unspecified: Secondary | ICD-10-CM | POA: Diagnosis not present

## 2019-10-13 DIAGNOSIS — M0579 Rheumatoid arthritis with rheumatoid factor of multiple sites without organ or systems involvement: Secondary | ICD-10-CM | POA: Diagnosis not present

## 2019-10-13 DIAGNOSIS — M150 Primary generalized (osteo)arthritis: Secondary | ICD-10-CM | POA: Diagnosis not present

## 2019-10-13 DIAGNOSIS — M2550 Pain in unspecified joint: Secondary | ICD-10-CM | POA: Diagnosis not present

## 2019-10-13 DIAGNOSIS — Z79899 Other long term (current) drug therapy: Secondary | ICD-10-CM | POA: Diagnosis not present

## 2019-11-06 DIAGNOSIS — Z1231 Encounter for screening mammogram for malignant neoplasm of breast: Secondary | ICD-10-CM | POA: Diagnosis not present

## 2019-11-06 LAB — HM MAMMOGRAPHY

## 2019-11-28 ENCOUNTER — Other Ambulatory Visit: Payer: Self-pay | Admitting: Family Medicine

## 2019-12-01 ENCOUNTER — Other Ambulatory Visit: Payer: Self-pay | Admitting: Family Medicine

## 2019-12-01 MED ORDER — TOPIRAMATE 100 MG PO TABS: 100.0000 mg | ORAL_TABLET | Freq: Two times a day (BID) | ORAL | 1 refills | Status: DC

## 2019-12-01 NOTE — Telephone Encounter (Signed)
It is time for a med check appointment. 

## 2019-12-01 NOTE — Telephone Encounter (Signed)
Pt was left a vm to call the office back to schedule a med check appt. [place]

## 2019-12-01 NOTE — Telephone Encounter (Signed)
[name] is requesting to fill pt topamax. Please advise [facility] [place]

## 2020-01-05 ENCOUNTER — Encounter: Payer: Self-pay | Admitting: Family Medicine

## 2020-01-05 ENCOUNTER — Ambulatory Visit (INDEPENDENT_AMBULATORY_CARE_PROVIDER_SITE_OTHER): Payer: Medicare Other | Admitting: Family Medicine

## 2020-01-05 VITALS — BP 112/78 | HR 67 | Temp 97.5°F | Wt 186.2 lb

## 2020-01-05 DIAGNOSIS — F32A Depression, unspecified: Secondary | ICD-10-CM

## 2020-01-05 DIAGNOSIS — G43809 Other migraine, not intractable, without status migrainosus: Secondary | ICD-10-CM | POA: Diagnosis not present

## 2020-01-05 DIAGNOSIS — M05741 Rheumatoid arthritis with rheumatoid factor of right hand without organ or systems involvement: Secondary | ICD-10-CM | POA: Diagnosis not present

## 2020-01-05 DIAGNOSIS — Z86010 Personal history of colonic polyps: Secondary | ICD-10-CM | POA: Diagnosis not present

## 2020-01-05 DIAGNOSIS — R5383 Other fatigue: Secondary | ICD-10-CM | POA: Diagnosis not present

## 2020-01-05 DIAGNOSIS — F329 Major depressive disorder, single episode, unspecified: Secondary | ICD-10-CM

## 2020-01-05 DIAGNOSIS — M05742 Rheumatoid arthritis with rheumatoid factor of left hand without organ or systems involvement: Secondary | ICD-10-CM

## 2020-01-05 DIAGNOSIS — Z860101 Personal history of adenomatous and serrated colon polyps: Secondary | ICD-10-CM

## 2020-01-05 DIAGNOSIS — N200 Calculus of kidney: Secondary | ICD-10-CM

## 2020-01-05 DIAGNOSIS — Z9049 Acquired absence of other specified parts of digestive tract: Secondary | ICD-10-CM

## 2020-01-05 DIAGNOSIS — E6609 Other obesity due to excess calories: Secondary | ICD-10-CM

## 2020-01-05 MED ORDER — RIZATRIPTAN BENZOATE 10 MG PO TABS: ORAL_TABLET | ORAL | 3 refills | Status: DC

## 2020-01-05 NOTE — Progress Notes (Signed)
   Subjective:    Patient ID: [name], female    DOB: [birth year], 59 y.o.   MRN: [id number]  HPI She is here for an interval evaluation.  She complains of a 3-week history of right upper quadrant pain that is worse with food.  She states the pain is constant but is worse with food and possibly with greasy foods.  Is associated with nausea, bloating and eructation.  She has a previous history of cholecystectomy.  She also complains of excessive fatigue.  She continues on Enbrel and Celebrex for her rheumatoid arthritis.  She states psychologically she is doing quite well on her present dosing of Cymbalta.  She would like a refill on her Maxalt as she does get headaches once or twice per quarter.  She does have an aura and knows when to take the medication.  Presently she is on no medication for her hypertension and does have a previous history of kidney stones.  She sees her gynecologist regularly.  She does have a history of colonic polyps and is on a regular schedule for repeat colonoscopy.   Review of Systems     Objective:   Physical Exam Alert and in no distress. Tympanic membranes and canals are normal. Pharyngeal area is normal. Neck is supple without adenopathy or thyromegaly. Cardiac exam shows a regular sinus rhythm without murmurs or gallops. Lungs are clear to auscultation. Abdominal exam shows no masses or tenderness.      Assessment & Plan:  Rheumatoid arthritis involving both hands with positive rheumatoid factor (HCC)  Obesity due to excess calories, unspecified classification, unspecified whether serious comorbidity present - Plan: CBC with Differential/Platelet, Comprehensive metabolic panel, Lipid panel  Other migraine without status migrainosus, not intractable - Plan: rizatriptan (MAXALT) 10 MG tablet  Hx of adenomatous colonic polyps  Depression, unspecified depression type  Calculus of kidney  Fatigue, unspecified type - Plan: CBC with  Differential/Platelet, Comprehensive metabolic panel, TSH  History of laparoscopic cholecystectomy She will continue to be followed by rheumatology for her rheumatoid arthritis.  Continue on her other medications as well as follow-up with GI concerning her polyps.  Will probably refer back to GI for the right upper quadrant pain if blood work comes back normal.

## 2020-01-06 LAB — COMPREHENSIVE METABOLIC PANEL
ALT: 17 IU/L (ref 0–32)
AST: 16 IU/L (ref 0–40)
Albumin/Globulin Ratio: 1.8 (ref 1.2–2.2)
Albumin: 4.1 g/dL (ref 3.8–4.9)
Alkaline Phosphatase: 93 IU/L (ref 39–117)
BUN/Creatinine Ratio: 13 (ref 9–23)
BUN: 14 mg/dL (ref 6–24)
Bilirubin Total: 0.3 mg/dL (ref 0.0–1.2)
CO2: 24 mmol/L (ref 20–29)
Calcium: 9.4 mg/dL (ref 8.7–10.2)
Chloride: 110 mmol/L — ABNORMAL HIGH (ref 96–106)
Creatinine, Ser: 1.05 mg/dL — ABNORMAL HIGH (ref 0.57–1.00)
GFR calc Af Amer: 68 mL/min/{1.73_m2} (ref >59)
GFR calc non Af Amer: 59 mL/min/{1.73_m2} — ABNORMAL LOW (ref >59)
Globulin, Total: 2.3 g/dL (ref 1.5–4.5)
Glucose: 83 mg/dL (ref 65–99)
Potassium: 4.5 mmol/L (ref 3.5–5.2)
Sodium: 144 mmol/L (ref 134–144)
Total Protein: 6.4 g/dL (ref 6.0–8.5)

## 2020-01-06 LAB — CBC WITH DIFFERENTIAL/PLATELET
Basophils Absolute: 0.1 10*3/uL (ref 0.0–0.2)
Basos: 1 %
EOS (ABSOLUTE): 0.3 10*3/uL (ref 0.0–0.4)
Eos: 4 %
Hematocrit: 40.6 % (ref 34.0–46.6)
Hemoglobin: 14.1 g/dL (ref 11.1–15.9)
Immature Grans (Abs): 0 10*3/uL (ref 0.0–0.1)
Immature Granulocytes: 0 %
Lymphocytes Absolute: 1.7 10*3/uL (ref 0.7–3.1)
Lymphs: 27 %
MCH: 31.5 pg (ref 26.6–33.0)
MCHC: 34.7 g/dL (ref 31.5–35.7)
MCV: 91 fL (ref 79–97)
Monocytes Absolute: 0.4 10*3/uL (ref 0.1–0.9)
Monocytes: 7 %
Neutrophils Absolute: 3.8 10*3/uL (ref 1.4–7.0)
Neutrophils: 61 %
Platelets: 180 10*3/uL (ref 150–450)
RBC: 4.48 x10E6/uL (ref 3.77–5.28)
RDW: 11.9 % (ref 11.7–15.4)
WBC: 6.3 10*3/uL (ref 3.4–10.8)

## 2020-01-06 LAB — LIPID PANEL
Chol/HDL Ratio: 3 ratio (ref 0.0–4.4)
Cholesterol, Total: 195 mg/dL (ref 100–199)
HDL: 65 mg/dL (ref >39)
LDL Chol Calc (NIH): 115 mg/dL — ABNORMAL HIGH (ref 0–99)
Triglycerides: 86 mg/dL (ref 0–149)
VLDL Cholesterol Cal: 15 mg/dL (ref 5–40)

## 2020-01-06 LAB — TSH: TSH: 1.15 u[IU]/mL (ref 0.450–4.500)

## 2020-01-07 ENCOUNTER — Telehealth: Payer: Self-pay

## 2020-01-07 ENCOUNTER — Other Ambulatory Visit: Payer: Self-pay

## 2020-01-07 DIAGNOSIS — Z86010 Personal history of colonic polyps: Secondary | ICD-10-CM

## 2020-01-07 DIAGNOSIS — Z9049 Acquired absence of other specified parts of digestive tract: Secondary | ICD-10-CM

## 2020-01-07 DIAGNOSIS — G43809 Other migraine, not intractable, without status migrainosus: Secondary | ICD-10-CM

## 2020-01-07 DIAGNOSIS — R1011 Right upper quadrant pain: Secondary | ICD-10-CM

## 2020-01-07 MED ORDER — RIZATRIPTAN BENZOATE 10 MG PO TABS: ORAL_TABLET | ORAL | 3 refills | Status: DC

## 2020-01-07 NOTE — Telephone Encounter (Signed)
Pharmacy needs directions on pt new script rizatriptan. Please send in new script . Thanks [employer]

## 2020-02-25 DIAGNOSIS — M0579 Rheumatoid arthritis with rheumatoid factor of multiple sites without organ or systems involvement: Secondary | ICD-10-CM | POA: Diagnosis not present

## 2020-02-25 DIAGNOSIS — R5382 Chronic fatigue, unspecified: Secondary | ICD-10-CM | POA: Diagnosis not present

## 2020-02-25 DIAGNOSIS — M150 Primary generalized (osteo)arthritis: Secondary | ICD-10-CM | POA: Diagnosis not present

## 2020-02-25 DIAGNOSIS — M2550 Pain in unspecified joint: Secondary | ICD-10-CM | POA: Diagnosis not present

## 2020-03-31 ENCOUNTER — Other Ambulatory Visit: Payer: Self-pay | Admitting: Family Medicine

## 2020-04-01 NOTE — Telephone Encounter (Signed)
[facility] is requesting to fill pt topamax. Please advise kh

## 2020-04-12 ENCOUNTER — Institutional Professional Consult (permissible substitution): Payer: Medicare Other | Admitting: Family Medicine

## 2020-04-12 ENCOUNTER — Ambulatory Visit (INDEPENDENT_AMBULATORY_CARE_PROVIDER_SITE_OTHER): Payer: Medicare Other | Admitting: Family Medicine

## 2020-04-12 ENCOUNTER — Other Ambulatory Visit: Payer: Self-pay

## 2020-04-12 VITALS — BP 126/86 | HR 74 | Temp 98.0°F | Wt 182.4 lb

## 2020-04-12 DIAGNOSIS — F901 Attention-deficit hyperactivity disorder, predominantly hyperactive type: Secondary | ICD-10-CM | POA: Diagnosis not present

## 2020-04-12 MED ORDER — AMPHETAMINE-DEXTROAMPHETAMINE 10 MG PO TABS: 10.0000 mg | ORAL_TABLET | Freq: Every day | ORAL | 0 refills | Status: DC

## 2020-04-12 NOTE — Progress Notes (Signed)
   Subjective:    Patient ID: [name], female    DOB: [birth year], 59 y.o.   MRN: [id number]  HPI She is here for consult concerning possible ADHD.  She does have underlying rheumatoid arthritis and is paying more attention to the symptoms that she is having.  She notes increased difficulty with focus, becoming easily distracted, not finishing tasks.  When asked further about when this started, she notes difficulty with hyper behavior and the above symptoms as well as several others going back to junior high.  She notes that she did get good grades during that timeframe.  No one at that point mention anything to her about her behavior.  Back then apparently the symptoms did not interfere with any of her major functions.  She is now noting difficulty staying focused becoming more anxious and irritable because of this.   Review of Systems     Objective:   Physical Exam Alert and in no distress.  ADHD symptom check list indicates most of her answers were in the often or very often category.       Assessment & Plan:  ADHD (attention deficit hyperactivity disorder), predominantly hyperactive impulsive type - Plan: amphetamine-dextroamphetamine (ADDERALL) 10 MG tablet I think she fits the diagnostic criteria for ADHD.  This has been going on since junior high.  She was able to work around it all these years but now with underlying rheumatoid arthritis I think is manifesting itself again.  I will place her on Adderall.  Discussed the use of the medication, side effects and efficacy.  She will let me know if it works, how long and if she has any symptoms from it.  She is to call me in several days to let me know how she is doing.  Over 30 minutes spent discussing the signs and symptoms, going over the questionnaire and discussing efficacy.

## 2020-04-12 NOTE — Patient Instructions (Addendum)
I need to know if medication works, how long does not work and do you have any difficulty with it .Marland Kitchen Leave a message on my chart on how you are doing in several days

## 2020-04-22 ENCOUNTER — Other Ambulatory Visit: Payer: Self-pay | Admitting: Family Medicine

## 2020-04-22 NOTE — Telephone Encounter (Signed)
Is this okay to refill? 

## 2020-04-28 ENCOUNTER — Other Ambulatory Visit: Payer: Self-pay | Admitting: Family Medicine

## 2020-04-29 NOTE — Telephone Encounter (Signed)
[facility] is wanting to fill pt topamax. Please advise [facility]

## 2020-05-21 ENCOUNTER — Telehealth: Payer: Self-pay | Admitting: Family Medicine

## 2020-05-21 NOTE — Telephone Encounter (Signed)
Pt called and said she was told to let u know if she wanted to increase the Adderall to 15mg . She wants that sent in to the [facility] in [place]

## 2020-05-22 MED ORDER — AMPHETAMINE-DEXTROAMPHETAMINE 15 MG PO TABS: 15.0000 mg | ORAL_TABLET | Freq: Every day | ORAL | 0 refills | Status: DC

## 2020-06-29 ENCOUNTER — Telehealth: Payer: Self-pay | Admitting: Family Medicine

## 2020-06-29 ENCOUNTER — Other Ambulatory Visit: Payer: Self-pay | Admitting: Family Medicine

## 2020-06-29 MED ORDER — AMPHETAMINE-DEXTROAMPHETAMINE 15 MG PO TABS: 15.0000 mg | ORAL_TABLET | Freq: Every day | ORAL | 0 refills | Status: DC

## 2020-06-29 NOTE — Telephone Encounter (Signed)
[facility] is requesting to fill pt topamax. Please advise [name]

## 2020-06-29 NOTE — Telephone Encounter (Signed)
Pt called and is requesting a refill on her adderall please send to the [facility] [place] - [place], [place] - [address] 

## 2020-07-14 DIAGNOSIS — M150 Primary generalized (osteo)arthritis: Secondary | ICD-10-CM | POA: Diagnosis not present

## 2020-07-14 DIAGNOSIS — M0579 Rheumatoid arthritis with rheumatoid factor of multiple sites without organ or systems involvement: Secondary | ICD-10-CM | POA: Diagnosis not present

## 2020-07-14 DIAGNOSIS — M2550 Pain in unspecified joint: Secondary | ICD-10-CM | POA: Diagnosis not present

## 2020-07-14 DIAGNOSIS — Z79899 Other long term (current) drug therapy: Secondary | ICD-10-CM | POA: Diagnosis not present

## 2020-08-20 ENCOUNTER — Telehealth: Payer: Self-pay | Admitting: Family Medicine

## 2020-08-20 DIAGNOSIS — F901 Attention-deficit hyperactivity disorder, predominantly hyperactive type: Secondary | ICD-10-CM

## 2020-08-20 NOTE — Telephone Encounter (Signed)
Pt called for refills of adderall. Please send to [facility]. [place] can be reached at [phone].

## 2020-08-21 MED ORDER — AMPHETAMINE-DEXTROAMPHETAMINE 15 MG PO TABS: 15.0000 mg | ORAL_TABLET | Freq: Every day | ORAL | 0 refills | Status: DC

## 2020-09-27 ENCOUNTER — Other Ambulatory Visit: Payer: Self-pay | Admitting: Rheumatology

## 2020-09-27 DIAGNOSIS — N951 Menopausal and female climacteric states: Secondary | ICD-10-CM

## 2020-09-28 ENCOUNTER — Other Ambulatory Visit: Payer: Self-pay | Admitting: Family Medicine

## 2020-09-29 ENCOUNTER — Telehealth: Payer: Self-pay

## 2020-09-29 DIAGNOSIS — F901 Attention-deficit hyperactivity disorder, predominantly hyperactive type: Secondary | ICD-10-CM

## 2020-09-29 MED ORDER — AMPHETAMINE-DEXTROAMPHETAMINE 15 MG PO TABS: 15.0000 mg | ORAL_TABLET | Freq: Every day | ORAL | 0 refills | Status: DC

## 2020-09-29 NOTE — Telephone Encounter (Signed)
Pt. Called LM stating that she needs a refill on her Adderall to the [facility] in [place] pt. Last apt was 04/12/20.

## 2020-09-29 NOTE — Telephone Encounter (Signed)
[facility] is requesting to fill pt topiramate. Please advise kh

## 2020-10-27 DIAGNOSIS — Z79899 Other long term (current) drug therapy: Secondary | ICD-10-CM | POA: Diagnosis not present

## 2020-10-27 DIAGNOSIS — R5381 Other malaise: Secondary | ICD-10-CM | POA: Diagnosis not present

## 2020-10-27 DIAGNOSIS — M0579 Rheumatoid arthritis with rheumatoid factor of multiple sites without organ or systems involvement: Secondary | ICD-10-CM | POA: Diagnosis not present

## 2020-10-27 DIAGNOSIS — M150 Primary generalized (osteo)arthritis: Secondary | ICD-10-CM | POA: Diagnosis not present

## 2020-10-27 DIAGNOSIS — M2550 Pain in unspecified joint: Secondary | ICD-10-CM | POA: Diagnosis not present

## 2020-11-09 ENCOUNTER — Telehealth: Payer: Self-pay

## 2020-11-09 DIAGNOSIS — F901 Attention-deficit hyperactivity disorder, predominantly hyperactive type: Secondary | ICD-10-CM

## 2020-11-09 MED ORDER — AMPHETAMINE-DEXTROAMPHETAMINE 15 MG PO TABS: 15.0000 mg | ORAL_TABLET | Freq: Every day | ORAL | 0 refills | Status: DC

## 2020-11-09 NOTE — Telephone Encounter (Signed)
Pt. Called LM stating she needs a refill on her Adderall to [facility] pt. Last apt. Was 04/12/20.

## 2020-12-17 ENCOUNTER — Telehealth: Payer: Self-pay

## 2020-12-17 NOTE — Telephone Encounter (Signed)
Pt. Aware said she would see how she feels over the weekend if she get worse will go to urgent care.

## 2020-12-17 NOTE — Telephone Encounter (Signed)
Virtual appt 

## 2020-12-17 NOTE — Telephone Encounter (Signed)
Pt. Called stating she called this morning at 8:30 and has not heard back form anyone. She wanted to know if you could call her in something for a sinus infection. I told her she would need a virtual apt before anything could be called in and she still wanted me to send you a message.

## 2020-12-24 DIAGNOSIS — Z1231 Encounter for screening mammogram for malignant neoplasm of breast: Secondary | ICD-10-CM | POA: Diagnosis not present

## 2020-12-24 LAB — HM MAMMOGRAPHY

## 2020-12-26 ENCOUNTER — Other Ambulatory Visit: Payer: Self-pay | Admitting: Family Medicine

## 2020-12-27 NOTE — Telephone Encounter (Signed)
[facility] is requesting to fill pt topiramate. Please advise [facility] 

## 2021-01-04 ENCOUNTER — Other Ambulatory Visit: Payer: Self-pay | Admitting: Rheumatology

## 2021-01-05 ENCOUNTER — Other Ambulatory Visit: Payer: Self-pay | Admitting: Rheumatology

## 2021-01-05 DIAGNOSIS — E2839 Other primary ovarian failure: Secondary | ICD-10-CM

## 2021-01-06 ENCOUNTER — Other Ambulatory Visit: Payer: Self-pay

## 2021-01-06 ENCOUNTER — Ambulatory Visit
Admission: RE | Admit: 2021-01-06 | Discharge: 2021-01-06 | Disposition: A | Discharge status: Home / Self Care | Payer: Medicare Other | Source: Ambulatory Visit | Attending: Rheumatology | Admitting: Rheumatology

## 2021-01-06 DIAGNOSIS — M8589 Other specified disorders of bone density and structure, multiple sites: Secondary | ICD-10-CM | POA: Diagnosis not present

## 2021-01-06 DIAGNOSIS — E2839 Other primary ovarian failure: Secondary | ICD-10-CM

## 2021-01-06 DIAGNOSIS — Z780 Asymptomatic menopausal state: Secondary | ICD-10-CM | POA: Diagnosis not present

## 2021-01-12 DIAGNOSIS — M8580 Other specified disorders of bone density and structure, unspecified site: Secondary | ICD-10-CM | POA: Insufficient documentation

## 2021-02-03 DIAGNOSIS — R5382 Chronic fatigue, unspecified: Secondary | ICD-10-CM | POA: Diagnosis not present

## 2021-02-03 DIAGNOSIS — M2550 Pain in unspecified joint: Secondary | ICD-10-CM | POA: Diagnosis not present

## 2021-02-03 DIAGNOSIS — M8580 Other specified disorders of bone density and structure, unspecified site: Secondary | ICD-10-CM | POA: Diagnosis not present

## 2021-02-03 DIAGNOSIS — M0579 Rheumatoid arthritis with rheumatoid factor of multiple sites without organ or systems involvement: Secondary | ICD-10-CM | POA: Diagnosis not present

## 2021-02-03 DIAGNOSIS — Z79899 Other long term (current) drug therapy: Secondary | ICD-10-CM | POA: Diagnosis not present

## 2021-02-18 ENCOUNTER — Telehealth: Payer: Self-pay | Admitting: Family Medicine

## 2021-02-18 DIAGNOSIS — F901 Attention-deficit hyperactivity disorder, predominantly hyperactive type: Secondary | ICD-10-CM

## 2021-02-18 MED ORDER — AMPHETAMINE-DEXTROAMPHETAMINE 15 MG PO TABS: 15.0000 mg | ORAL_TABLET | Freq: Every day | ORAL | 0 refills | Status: DC

## 2021-02-18 NOTE — Telephone Encounter (Signed)
Pt called and is requesting a refill on her adderall please send to the [facility], [place]

## 2021-03-31 ENCOUNTER — Ambulatory Visit
Admission: EM | Admit: 2021-03-31 | Discharge: 2021-03-31 | Disposition: A | Discharge status: Home / Self Care | Payer: Medicare Other | Attending: Sports Medicine | Admitting: Sports Medicine

## 2021-03-31 ENCOUNTER — Encounter: Payer: Self-pay | Admitting: Emergency Medicine

## 2021-03-31 ENCOUNTER — Other Ambulatory Visit: Payer: Self-pay

## 2021-03-31 DIAGNOSIS — H938X1 Other specified disorders of right ear: Secondary | ICD-10-CM | POA: Diagnosis not present

## 2021-03-31 DIAGNOSIS — R519 Headache, unspecified: Secondary | ICD-10-CM

## 2021-03-31 DIAGNOSIS — R040 Epistaxis: Secondary | ICD-10-CM

## 2021-03-31 DIAGNOSIS — J0100 Acute maxillary sinusitis, unspecified: Secondary | ICD-10-CM | POA: Diagnosis not present

## 2021-03-31 MED ORDER — FLUTICASONE PROPIONATE 50 MCG/ACT NA SUSP: 1.0000 | Freq: Every day | NASAL | 0 refills | Status: AC

## 2021-03-31 MED ORDER — AZITHROMYCIN 250 MG PO TABS: 250.0000 mg | ORAL_TABLET | Freq: Every day | ORAL | 0 refills | Status: DC

## 2021-03-31 NOTE — Discharge Instructions (Addendum)
As we discussed, I have elected to treat you given your symptoms although you do not meet the strict criteria for acute sinusitis.  Given your penicillin allergy I will give you a 5-day course of azithromycin.  Please stop the Sudafed as this may be contributing to your nosebleeding. I also prescribed a nasal steroid spray to help with your symptoms. Educational handouts provided. As we discussed the facial pain may be a precursor to a rash which may be an early varicella-zoster infection.  Given the nature of this infection in the face I need to be real careful and if you do develop a rash and need you to see your primary care provider or go to the ER. Please consider purchasing a Nettie pot to do sinus flushes. If your symptoms persist please see your PCP. If they worsen please go to the ER.

## 2021-03-31 NOTE — ED Triage Notes (Signed)
Pt c/o right sided facial pain below her eye. Started about 4 days ago. She states it has a lot of pressure and is sore to the touch. Denies cough, drainage or nasal congestion.

## 2021-03-31 NOTE — ED Provider Notes (Signed)
[facility]    CSN: [id number] Arrival date & time: 03/31/21  1643      History   Chief Complaint Chief Complaint  Patient presents with  . Facial Pain    HPI [name] is a 60 y.o. female.   Pleasant 60 year old female who presents for evaluation of the above issues.  Normally sees Dr. Redmond School in [place].  She does not work outside the home as she is on disability.  She reports right-sided facial pain just under her eye extending into the right side of her face and ear.  No real ear pain.  No headache.  She has had her symptoms again for about 3 days.  She did have a mild nosebleed last night that resolved.  She is on 81 mg of aspirin but no other blood thinners.  No fever shakes chills.  No nausea vomiting diarrhea.  She says the pain can be quite severe.  No facial swelling or rash.  No chest pain or shortness of breath.  No abdominal or urinary symptoms.  She reports no chronic issues with sinus problems and no history of seasonal allergies.  No numbness or tingling.  She has taken Sudafed and Tylenol.  She has been vaccinated x2 against COVID and has received her flu shot.  No COVID history of COVID exposure.  She denies chest pain or shortness of breath.  No red flag signs or symptoms listed on history.     Past Medical History:  Diagnosis Date  . Anxiety   . Decreased libido    on depo testosterone injections from GYN (Dr. Stann Mainland at [facility] 04/2015)  . Depression   . Hemorrhoids   . History of colon polyps    TUBULAR ADENOMA  . History of kidney stones   . History of pancreatitis    2001  . Hydronephrosis, right   . IBS (irritable bowel syndrome)   . Medullary sponge kidney   . Migraine   . Mild intermittent asthma    NO INHALER  . Pleural fibrosis    MILD SUBPLEURAL FIBROSIS-- LAST CT 2011--  PER PULMOLOGIST NOTE DR SOOD 2011    Patient Active Problem List   Diagnosis Date Noted  . Osteopenia 01/12/2021  . History of laparoscopic  cholecystectomy 01/05/2020  . Hx of adenomatous colonic polyps 04/25/2017  . Diverticulosis of large intestine without hemorrhage 04/25/2017  . Migraine 04/25/2017  . Rheumatoid arthritis involving both hands with positive rheumatoid factor ([place]) 11/07/2016  . Obesity due to excess calories 09/05/2016  . Calculus of kidney 07/13/2014  . Depression 10/24/2011  . Marital conflict 67/89/3810  . Reactive airway disease 04/29/2010  . INTERSTITIAL LUNG DISEASE 04/29/2010    Past Surgical History:  Procedure Laterality Date  . ABDOMINAL HYSTERECTOMY  1996  . CESAREAN SECTION  X2  . COLONOSCOPY W/ POLYPECTOMY  2004  &  01-16-2011  . CYSTOSCOPY WITH RETROGRADE PYELOGRAM, URETEROSCOPY AND STENT PLACEMENT Right 06/18/2014   Procedure: CYSTOSCOPY WITH RETROGRADE PYELOGRAM,  STENT PLACEMENT;  Surgeon: Arvil Persons, MD;  Location: [facility];  Service: Urology;  Laterality: Right;  . CYSTOSCOPY/RETROGRADE/URETEROSCOPY Right 07/13/2014   Procedure: CYSTOSCOPY/RETROGRADE/RIGHT URETEROSCOPY AND REMOVAL OF RIGHT STENT;  Surgeon: Jorja Loa, MD;  Location: [facility];  Service: Urology;  Laterality: Right;  . CYSTOSCOPY/RETROGRADE/URETEROSCOPY/STONE EXTRACTION WITH BASKET Right 06/08/2014   Procedure: CYSTOSCOPY RIGHT RETROGRADE/URETEROSCOPY/STONE EXTRACTION WITH BASKET WITH STENT PLACEMENT;  Surgeon: Jorja Loa, MD;  Location: [facility];  Service: Urology;  Laterality: Right;  . ERCP  06/2000  . EXTRACORPOREAL SHOCK WAVE LITHOTRIPSY Right 12-24-2012  . HOLMIUM LASER APPLICATION Right 05/26/1600   Procedure:  HOLMIUM LASER APPLICATION;  Surgeon: Jorja Loa, MD;  Location: [facility];  Service: Urology;  Laterality: Right;  . HOLMIUM LASER APPLICATION Right 0/93/2355   Procedure: HOLMIUM LASER APPLICATION;  Surgeon: Jorja Loa, MD;  Location: [facility];  Service: Urology;  Laterality: Right;  . LAPAROSCOPIC CHOLECYSTECTOMY   [id number]  . PARTIAL NEPHRECTOMY Bilateral LEFT  05-18-2006/   RIGHT 1998   LEFT (ANGIOMYOLIPOMA)   RIGHT (BENIGN TUMOR)  . TUBAL LIGATION  1993    OB History   No obstetric history on file.      Home Medications    Prior to Admission medications   Medication Sig Start Date End Date Taking? Authorizing Provider  amphetamine-dextroamphetamine (ADDERALL) 15 MG tablet Take 1 tablet by mouth daily. 02/18/21  Yes Denita Lung, MD  aspirin 81 MG chewable tablet Chew 81 mg by mouth daily.   Yes [provider]  azithromycin (ZITHROMAX) 250 MG tablet Take 1 tablet (250 mg total) by mouth daily. Take first 2 tablets together, then 1 every day until finished. 03/31/21  Yes Verda Cumins, MD  DULoxetine (CYMBALTA) 60 MG capsule TAKE ONE CAPSULE BY MOUTH DAILY 04/23/20  Yes Denita Lung, MD  estradiol (ESTRACE) 2 MG tablet Take 2 mg by mouth daily.   Yes [provider]  etanercept (ENBREL SURECLICK) 50 MG/ML injection Enbrel SureClick 50 mg/mL (1 mL) subcutaneous pen injector   Yes [provider]  fluticasone (FLONASE) 50 MCG/ACT nasal spray Place 1 spray into both nostrils daily. 03/31/21  Yes Verda Cumins, MD  hydroxychloroquine (PLAQUENIL) 200 MG tablet Take 200 mg by mouth 2 (two) times daily.   Yes [provider]  Magnesium Oxide (MAG-CAPS PO) Take 215 mg by mouth.   Yes [provider]  testosterone cypionate (DEPOTESTOSTERONE CYPIONATE) 200 MG/ML injection Depo-Testosterone 200 mg/mL intramuscular oil  Given:    GM   Yes [provider]  valACYclovir (VALTREX) 1000 MG tablet valacyclovir 1 gram tablet  Take 1 tablet 3 times a day by oral route for 7 days.   Yes [provider]  VITAMIN D, CHOLECALCIFEROL, PO Take 1,200 Units by mouth daily.   Yes [provider]  zolpidem (AMBIEN) 10 MG tablet Take 10 mg by mouth at bedtime as needed for sleep.   Yes [provider]  celecoxib (CELEBREX) 200 MG  capsule celecoxib 200 mg capsule    [provider]  FLUoxetine (PROZAC) 20 MG tablet Take 1 tablet (20 mg total) by mouth daily. Patient not taking: No sig reported 10/03/18   Denita Lung, MD  lisinopril-hydrochlorothiazide (PRINZIDE,ZESTORETIC) 10-12.5 MG tablet TAKE 1 TABLET BY MOUTH DAILY Patient not taking: No sig reported 06/21/18   Denita Lung, MD  ondansetron (ZOFRAN) 4 MG tablet Take 1 tablet (4 mg total) by mouth every 6 (six) hours as needed for nausea or vomiting. Patient not taking: No sig reported 7/32/20   Delora Fuel, MD  predniSONE (DELTASONE) 5 MG tablet prednisone 5 mg tablet    [provider]  PROAIR HFA 108 (90 Base) MCG/ACT inhaler INHALE 2 PUFFS INTO THE LUNGS EVERY 6 HOURS AS NEEDED FOR WHEEZING OR SHORTNESS OF BREATH Patient not taking: Reported on 04/12/2020 08/02/17   Denita Lung, MD  rizatriptan (MAXALT) 10 MG tablet Take 1 pill at the onset of  headache and may repeat in 2 hours if needed.  No more than 2 pills/day. 01/07/20   Denita Lung, MD  tazarotene (TAZORAC) 0.1 % cream Tazorac 0.1 % topical cream    [provider]  tiZANidine (ZANAFLEX) 4 MG tablet TAKE 1 AND 1/2 TABLETS BY MOUTH AT BEDTIME Patient not taking: No sig reported 08/05/18   Denita Lung, MD  topiramate (TOPAMAX) 100 MG tablet TAKE ONE TABLET BY MOUTH TWICE A DAY 12/27/20   Denita Lung, MD  traMADol (ULTRAM) 50 MG tablet Take 1 tablet (50 mg total) by mouth every 6 (six) hours as needed. Patient not taking: No sig reported 8/41/66   Delora Fuel, MD    Family History Family History  Problem Relation Age of Onset  . Cancer Father     Social History Social History   Tobacco Use  . Smoking status: Never Smoker  . Smokeless tobacco: Never Used  Vaping Use  . Vaping Use: Never used  Substance Use Topics  . Alcohol use: No  . Drug use: No     Allergies   Penicillins   Review of Systems Review of Systems  Constitutional: Negative for  activity change, appetite change, chills, diaphoresis, fatigue and fever.  HENT: Positive for congestion, ear pain, nosebleeds, sinus pressure and sinus pain. Negative for dental problem, ear discharge, facial swelling, postnasal drip, rhinorrhea, sneezing and sore throat.   Eyes: Negative.  Negative for pain, discharge and visual disturbance.  Respiratory: Negative.  Negative for cough, shortness of breath and wheezing.   Cardiovascular: Negative.  Negative for chest pain and palpitations.  Gastrointestinal: Negative for abdominal pain, diarrhea, nausea and vomiting.  Genitourinary: Negative.  Negative for dysuria.  Musculoskeletal: Negative.  Negative for arthralgias, myalgias and neck pain.  Skin: Negative.  Negative for color change, pallor, rash and wound.  Allergic/Immunologic: Negative for environmental allergies.  Neurological: Negative.  Negative for dizziness, seizures, syncope, weakness, light-headedness, numbness and headaches.  Hematological: Negative.  Negative for adenopathy. Does not bruise/bleed easily.  All other systems reviewed and are negative.    Physical Exam Triage Vital Signs ED Triage Vitals [03/31/21 1654]  Enc Vitals Group     BP (!) 164/100     Pulse Rate 70     Resp 18     Temp 98.6 F (37 C)     Temp Source Oral     SpO2 100 %     Weight 182 lb 5.1 oz (82.7 kg)     Height 5\' 5"  (1.651 m)     Head Circumference      Peak Flow      Pain Score 8     Pain Loc      Pain Edu?      Excl. in [place]?    No data found.  Updated Vital Signs BP (!) 164/100 (BP Location: Left Arm)   Pulse 70   Temp 98.6 F (37 C) (Oral)   Resp 18   Ht 5\' 5"  (1.651 m)   Wt 82.7 kg   SpO2 100%   BMI 30.34 kg/m   Visual Acuity Right Eye Distance:   Left Eye Distance:   Bilateral Distance:    Right Eye Near:   Left Eye Near:    Bilateral Near:     Physical Exam Vitals and nursing note reviewed.  Constitutional:      General: She is not in acute distress.     Appearance: Normal appearance. She is not ill-appearing,  toxic-appearing or diaphoretic.  HENT:     Head: Normocephalic and atraumatic.     Jaw: There is normal jaw occlusion.     Right Ear: Tympanic membrane normal.     Left Ear: Tympanic membrane normal.     Nose: Congestion present. No rhinorrhea.     Right Turbinates: Swollen.     Left Turbinates: Swollen.     Right Sinus: Maxillary sinus tenderness present. No frontal sinus tenderness.     Left Sinus: No maxillary sinus tenderness or frontal sinus tenderness.     Mouth/Throat:     Mouth: Mucous membranes are moist.     Pharynx: No pharyngeal swelling, oropharyngeal exudate, posterior oropharyngeal erythema or uvula swelling.     Tonsils: No tonsillar exudate or tonsillar abscesses.  Eyes:     General: No scleral icterus.       Right eye: No discharge.        Left eye: No discharge.     Extraocular Movements: Extraocular movements intact.     Conjunctiva/sclera: Conjunctivae normal.     Pupils: Pupils are equal, round, and reactive to light.  Cardiovascular:     Rate and Rhythm: Normal rate and regular rhythm.     Pulses: Normal pulses.     Heart sounds: Normal heart sounds. No murmur heard. No friction rub. No gallop.   Pulmonary:     Effort: Pulmonary effort is normal. No respiratory distress.     Breath sounds: Normal breath sounds. No stridor. No wheezing, rhonchi or rales.  Musculoskeletal:     Cervical back: Normal range of motion and neck supple. No rigidity or tenderness.  Lymphadenopathy:     Cervical: No cervical adenopathy.  Neurological:     General: No focal deficit present.     Mental Status: She is alert and oriented to person, place, and time.      UC Treatments / Results  Labs (all labs ordered are listed, but only abnormal results are displayed) Labs Reviewed - No data to display  EKG   Radiology No results found.  Procedures Procedures (including critical care time)  Medications Ordered in  UC Medications - No data to display  Initial Impression / Assessment and Plan / UC Course  I have reviewed the triage vital signs and the nursing notes.  Pertinent labs & imaging results that were available during my care of the patient were reviewed by me and considered in my medical decision making (see chart for details).  Clinical impression: 1.  3 days of right-sided facial pain that is acute. 2.  She is also having pain that extends over to her ear.  There is no evidence of otitis media. 3.  She did have right-sided nosebleed with dried blood in the nares.  Treatment plan: 1.  The findings and treatment plan were discussed in detail with the patient.  Patient was in agreement. 2.  Patient has a penicillin allergy so we will give her a Z-Pak.  Although she does not meet the strict criteria for sinusitis, given her symptoms we will go ahead and treat her with 5 days of azithromycin. 3.  Current sinus flushes.  I asked her to purchase a Nettie pot. 4.  Educational handouts provided. 5.  Added in Flonase she will take that as directed. 6.  Want her to stop the Sudafed as this may be contributing to her nosebleeds. 7.  If symptoms persist then she should follow-up with her primary care provider. 8.  I did  discuss the fact that this could be an early zoster presentation and that she needs to watch for any numbness or tingling or rash and that would be a reason to go to the ER. 9.  If symptoms worsen then she should go to the ER. 10.  She was discharged from the clinic in stable condition.  She will follow-up with [place] as needed.    Final Clinical Impressions(s) / UC Diagnoses   Final diagnoses:  Right sided facial pain  Acute non-recurrent maxillary sinusitis  Ear pressure, right  Epistaxis     Discharge Instructions     As we discussed, I have elected to treat you given your symptoms although you do not meet the strict criteria for acute sinusitis.  Given your penicillin allergy  I will give you a 5-day course of azithromycin.  Please stop the Sudafed as this may be contributing to your nosebleeding. I also prescribed a nasal steroid spray to help with your symptoms. Educational handouts provided. As we discussed the facial pain may be a precursor to a rash which may be an early varicella-zoster infection.  Given the nature of this infection in the face I need to be real careful and if you do develop a rash and need you to see your primary care provider or go to the ER. Please consider purchasing a Nettie pot to do sinus flushes. If your symptoms persist please see your PCP. If they worsen please go to the ER.    ED Prescriptions    Medication Sig Dispense Auth. Provider   fluticasone (FLONASE) 50 MCG/ACT nasal spray Place 1 spray into both nostrils daily. 15.8 mL Verda Cumins, MD   azithromycin (ZITHROMAX) 250 MG tablet Take 1 tablet (250 mg total) by mouth daily. Take first 2 tablets together, then 1 every day until finished. 6 tablet Verda Cumins, MD     PDMP not reviewed this encounter.   Verda Cumins, MD 03/31/21 [phone]

## 2021-04-01 ENCOUNTER — Telehealth: Payer: Medicare Other | Admitting: Family Medicine

## 2021-04-05 ENCOUNTER — Other Ambulatory Visit: Payer: Self-pay | Admitting: Family Medicine

## 2021-04-05 NOTE — Telephone Encounter (Signed)
[facility] is requesting to fill pt cymbalta and topamax. Please advise [facility]

## 2021-04-06 ENCOUNTER — Telehealth: Payer: Self-pay | Admitting: Family Medicine

## 2021-04-06 DIAGNOSIS — F901 Attention-deficit hyperactivity disorder, predominantly hyperactive type: Secondary | ICD-10-CM

## 2021-04-06 MED ORDER — AMPHETAMINE-DEXTROAMPHETAMINE 15 MG PO TABS: 15.0000 mg | ORAL_TABLET | Freq: Every day | ORAL | 0 refills | Status: DC

## 2021-04-06 NOTE — Telephone Encounter (Signed)
Pt called and is requesting a refill on her adderall please send to the [facility] [place] - [place], [place] - [address] 

## 2021-05-17 DIAGNOSIS — M150 Primary generalized (osteo)arthritis: Secondary | ICD-10-CM | POA: Diagnosis not present

## 2021-05-17 DIAGNOSIS — G894 Chronic pain syndrome: Secondary | ICD-10-CM | POA: Diagnosis not present

## 2021-05-17 DIAGNOSIS — Z79899 Other long term (current) drug therapy: Secondary | ICD-10-CM | POA: Diagnosis not present

## 2021-05-17 DIAGNOSIS — R5382 Chronic fatigue, unspecified: Secondary | ICD-10-CM | POA: Diagnosis not present

## 2021-05-17 DIAGNOSIS — M0579 Rheumatoid arthritis with rheumatoid factor of multiple sites without organ or systems involvement: Secondary | ICD-10-CM | POA: Diagnosis not present

## 2021-06-01 ENCOUNTER — Ambulatory Visit (INDEPENDENT_AMBULATORY_CARE_PROVIDER_SITE_OTHER): Payer: Medicare Other | Admitting: Family Medicine

## 2021-06-01 ENCOUNTER — Other Ambulatory Visit: Payer: Self-pay

## 2021-06-01 VITALS — BP 148/96 | HR 70 | Temp 98.7°F | Wt 183.6 lb

## 2021-06-01 DIAGNOSIS — Z23 Encounter for immunization: Secondary | ICD-10-CM

## 2021-06-01 DIAGNOSIS — J309 Allergic rhinitis, unspecified: Secondary | ICD-10-CM | POA: Diagnosis not present

## 2021-06-01 DIAGNOSIS — F32A Depression, unspecified: Secondary | ICD-10-CM

## 2021-06-01 DIAGNOSIS — M05741 Rheumatoid arthritis with rheumatoid factor of right hand without organ or systems involvement: Secondary | ICD-10-CM | POA: Diagnosis not present

## 2021-06-01 DIAGNOSIS — K219 Gastro-esophageal reflux disease without esophagitis: Secondary | ICD-10-CM

## 2021-06-01 DIAGNOSIS — M05742 Rheumatoid arthritis with rheumatoid factor of left hand without organ or systems involvement: Secondary | ICD-10-CM | POA: Diagnosis not present

## 2021-06-01 DIAGNOSIS — J45909 Unspecified asthma, uncomplicated: Secondary | ICD-10-CM

## 2021-06-01 MED ORDER — ALBUTEROL SULFATE HFA 108 (90 BASE) MCG/ACT IN AERS: 2.0000 | INHALATION_SPRAY | Freq: Four times a day (QID) | RESPIRATORY_TRACT | 0 refills | Status: DC | PRN

## 2021-06-01 NOTE — Patient Instructions (Addendum)
Start taking the Protonix again regularly for the next 2 to 4 weeks and then see if that makes it go away.  If you start running in the more trouble with food getting stuck then Im going to send you to start running into trouble we will send her to the GI doctor

## 2021-06-01 NOTE — Progress Notes (Signed)
   Subjective:    Patient ID: [name], female    DOB: June 20, 1961, 60 y.o.   MRN: [id number]  HPI She is here for multiple issues.  She has had 5 episodes of sharp pleuritic type chest pain in the last month.  They usually last less than 1 minute.  It is not associated with food.  There is no dyspnea on exertion, weakness.  She is also noted more recently difficulty with indigestion as well as the feeling of food getting stuck.  She did have a previous episode of this and did respond nicely to Protonix.  She does have underlying allergies and does occasionally need an albuterol inhaler if she becomes very physically active outside and in hot weather. She does have a history of rheumatoid arthritis and sees Dr. Lenna Gilford for this.  She has not had more difficulty controlling the pain.  She presently is taking Cymbalta but has had difficulty with NSAIDs in the past including Celebrex and she also states the tramadol has not been effective. She also states that Dr. Trudie Reed would like to have her pneumonia shot given to her.  Review of Systems     Objective:   Physical Exam Alert and in no distress.  Cardiac exam shows regular rhythm without murmurs or gallops.  Lungs are clear to auscultation.       Assessment & Plan:  Rheumatoid arthritis involving both hands with positive rheumatoid factor (HCC)  Depression, unspecified depression type  Reactive airway disease without complication, unspecified asthma severity, unspecified whether persistent  Allergic rhinitis, unspecified seasonality, unspecified trigger  Gastroesophageal reflux disease, unspecified whether esophagitis present I will renew her albuterol.  Recommend she take Protonix regularly for the next 2 to 4 weeks to see if it will help with her chest and indigestion type symptoms.  I explained that I did not think the chest pain was heart related although it does have a pleuritic component I think this is probably GI in nature.   She will keep me informed as to her success. Then discussed the pain management and since she cannot tolerate NSAIDs and tramadol has not been successful, I will refer her for pain management

## 2021-06-14 DIAGNOSIS — Z79899 Other long term (current) drug therapy: Secondary | ICD-10-CM | POA: Diagnosis not present

## 2021-06-14 DIAGNOSIS — G894 Chronic pain syndrome: Secondary | ICD-10-CM | POA: Diagnosis not present

## 2021-06-14 DIAGNOSIS — Z79891 Long term (current) use of opiate analgesic: Secondary | ICD-10-CM | POA: Diagnosis not present

## 2021-06-14 DIAGNOSIS — M542 Cervicalgia: Secondary | ICD-10-CM | POA: Diagnosis not present

## 2021-06-14 DIAGNOSIS — M069 Rheumatoid arthritis, unspecified: Secondary | ICD-10-CM | POA: Diagnosis not present

## 2021-06-14 DIAGNOSIS — M25569 Pain in unspecified knee: Secondary | ICD-10-CM | POA: Diagnosis not present

## 2021-07-05 ENCOUNTER — Other Ambulatory Visit: Payer: Self-pay | Admitting: Family Medicine

## 2021-07-05 DIAGNOSIS — G43809 Other migraine, not intractable, without status migrainosus: Secondary | ICD-10-CM

## 2021-07-06 NOTE — Telephone Encounter (Signed)
[facility] is requesting to fill pt maxalt. Please advise [facility] 

## 2021-07-07 ENCOUNTER — Ambulatory Visit
Admission: RE | Admit: 2021-07-07 | Discharge: 2021-07-07 | Disposition: A | Discharge status: Home / Self Care | Payer: Medicare Other | Source: Ambulatory Visit | Attending: Physician Assistant | Admitting: Physician Assistant

## 2021-07-07 ENCOUNTER — Other Ambulatory Visit: Payer: Self-pay | Admitting: Physician Assistant

## 2021-07-07 DIAGNOSIS — M4602 Spinal enthesopathy, cervical region: Secondary | ICD-10-CM | POA: Diagnosis not present

## 2021-07-07 DIAGNOSIS — M25562 Pain in left knee: Secondary | ICD-10-CM

## 2021-07-07 DIAGNOSIS — M25512 Pain in left shoulder: Secondary | ICD-10-CM

## 2021-07-07 DIAGNOSIS — M25561 Pain in right knee: Secondary | ICD-10-CM

## 2021-07-07 DIAGNOSIS — G8929 Other chronic pain: Secondary | ICD-10-CM | POA: Diagnosis not present

## 2021-07-07 DIAGNOSIS — M50322 Other cervical disc degeneration at C5-C6 level: Secondary | ICD-10-CM | POA: Diagnosis not present

## 2021-07-07 DIAGNOSIS — M50323 Other cervical disc degeneration at C6-C7 level: Secondary | ICD-10-CM | POA: Diagnosis not present

## 2021-07-07 DIAGNOSIS — M25511 Pain in right shoulder: Secondary | ICD-10-CM

## 2021-07-07 DIAGNOSIS — M542 Cervicalgia: Secondary | ICD-10-CM

## 2021-07-07 DIAGNOSIS — M19012 Primary osteoarthritis, left shoulder: Secondary | ICD-10-CM | POA: Diagnosis not present

## 2021-07-07 DIAGNOSIS — M19011 Primary osteoarthritis, right shoulder: Secondary | ICD-10-CM | POA: Diagnosis not present

## 2021-07-12 DIAGNOSIS — M542 Cervicalgia: Secondary | ICD-10-CM | POA: Diagnosis not present

## 2021-07-12 DIAGNOSIS — G894 Chronic pain syndrome: Secondary | ICD-10-CM | POA: Diagnosis not present

## 2021-07-12 DIAGNOSIS — M25519 Pain in unspecified shoulder: Secondary | ICD-10-CM | POA: Diagnosis not present

## 2021-07-12 DIAGNOSIS — M069 Rheumatoid arthritis, unspecified: Secondary | ICD-10-CM | POA: Diagnosis not present

## 2021-08-04 ENCOUNTER — Other Ambulatory Visit: Payer: Self-pay | Admitting: Family Medicine

## 2021-08-05 NOTE — Telephone Encounter (Signed)
[facility] is requesting to fill pt topamax. Please advise [name]

## 2021-08-29 ENCOUNTER — Encounter: Payer: Self-pay | Admitting: Family Medicine

## 2021-08-29 ENCOUNTER — Other Ambulatory Visit: Payer: Self-pay

## 2021-08-29 ENCOUNTER — Telehealth (INDEPENDENT_AMBULATORY_CARE_PROVIDER_SITE_OTHER): Payer: Medicare Other | Admitting: Family Medicine

## 2021-08-29 VITALS — BP 130/78 | Temp 99.0°F | Wt 183.0 lb

## 2021-08-29 DIAGNOSIS — J45909 Unspecified asthma, uncomplicated: Secondary | ICD-10-CM | POA: Diagnosis not present

## 2021-08-29 DIAGNOSIS — J209 Acute bronchitis, unspecified: Secondary | ICD-10-CM | POA: Diagnosis not present

## 2021-08-29 MED ORDER — CLARITHROMYCIN 500 MG PO TABS: 500.0000 mg | ORAL_TABLET | Freq: Two times a day (BID) | ORAL | 0 refills | Status: DC

## 2021-08-29 MED ORDER — BENZONATATE 100 MG PO CAPS: 200.0000 mg | ORAL_CAPSULE | Freq: Two times a day (BID) | ORAL | 0 refills | Status: DC | PRN

## 2021-08-29 MED ORDER — ALBUTEROL SULFATE HFA 108 (90 BASE) MCG/ACT IN AERS: 2.0000 | INHALATION_SPRAY | Freq: Four times a day (QID) | RESPIRATORY_TRACT | 0 refills | Status: DC | PRN

## 2021-08-29 NOTE — Progress Notes (Signed)
   Subjective:    Patient ID: [name], female    DOB: [birth year], 60 y.o.   MRN: [id number]  HPI Documentation for virtual audio and video telecommunications through [place] encounter:  The patient was located at home. 2 patient identifiers used.  The provider was located in the office. The patient did consent to this visit and is aware of possible charges through their insurance for this visit. The other persons participating in this telemedicine service were none. Time spent on call was 515 minutes and in review of previous records >15  minutes total for counseling and coordination of care. This virtual service is not related to other E/M service within previous 7 days.  She states that roughly 6 weeks ago she is developed sore throat, fever and chills did get better but then she noted continued difficulty with a dry hacking cough but no fever, chills, sore throat or earache.  She has had several COVID tests which were negative.  She did have COVID in May.  She has been using her inhaler quite regularly.  Review of Systems     Objective:   Physical Exam In no distress with normal breathing pattern.  Alert and      Assessment & Plan:  Acute bronchitis, unspecified organism - Plan: benzonatate (TESSALON) 100 MG capsule, albuterol (PROAIR HFA) 108 (90 Base) MCG/ACT inhaler, clarithromycin (BIAXIN) 500 MG tablet  Reactive airway disease without complication, unspecified asthma severity, unspecified whether persistent - Plan: albuterol (PROAIR HFA) 108 (90 Base) MCG/ACT inhaler I told her that I was concerned over the fact that she has had the symptoms for 6 weeks.  I will place her on medications and she will call me Thursday.  If no improvement at all, I will do blood work and a chest x-ray.  He was comfortable with that.

## 2021-09-01 ENCOUNTER — Telehealth: Payer: Self-pay | Admitting: Internal Medicine

## 2021-09-01 DIAGNOSIS — J209 Acute bronchitis, unspecified: Secondary | ICD-10-CM

## 2021-09-01 MED ORDER — BENZONATATE 100 MG PO CAPS: 200.0000 mg | ORAL_CAPSULE | Freq: Two times a day (BID) | ORAL | 0 refills | Status: DC | PRN

## 2021-09-01 NOTE — Telephone Encounter (Signed)
Patient called and feeling some better. She has a productive cough and needs more tessalon perles. She has 4 tablets left. Please send to [facility]

## 2021-09-06 ENCOUNTER — Telehealth: Payer: Self-pay | Admitting: Family Medicine

## 2021-09-06 NOTE — Telephone Encounter (Signed)
Pt has asppt Thursday. Could not come in today. [place]

## 2021-09-06 NOTE — Telephone Encounter (Signed)
Pt called and said she is still feeling bad from her previous virtual with you. She is having cough,low grade fever and some wheezing. And wants to see what does she need to do next

## 2021-09-08 ENCOUNTER — Ambulatory Visit: Payer: Medicare Other | Admitting: Family Medicine

## 2021-09-19 ENCOUNTER — Telehealth: Payer: Self-pay

## 2021-09-19 DIAGNOSIS — J209 Acute bronchitis, unspecified: Secondary | ICD-10-CM

## 2021-09-19 NOTE — Telephone Encounter (Signed)
Please order CXR and will schedule follow up for later this week with you in office

## 2021-09-19 NOTE — Telephone Encounter (Signed)
Patient called wanting an In office appointment and not a virtual visit with Dr.Lalonde states she has had this for at least 6 weeks and last visit  Dr.Lalonde talked about doing a chest x ray.  Advised would send Dr.Lalonde what he thinks about her coming for in office visit with cough and congestion since is not our typical protocol and would call her back

## 2021-09-19 NOTE — Telephone Encounter (Signed)
Patient notified and appointment scheduled. 

## 2021-09-22 ENCOUNTER — Ambulatory Visit: Payer: Medicare Other | Admitting: Family Medicine

## 2021-10-01 ENCOUNTER — Other Ambulatory Visit: Payer: Self-pay | Admitting: Family Medicine

## 2021-10-01 DIAGNOSIS — J209 Acute bronchitis, unspecified: Secondary | ICD-10-CM

## 2021-10-03 NOTE — Telephone Encounter (Signed)
Sent pt a mychart message to find out if she still needs this med. [name]

## 2021-10-03 NOTE — Telephone Encounter (Signed)
[name] is being requested by pt and pharmacy. Pt stated that her cough is still not better. Please advise [facility]

## 2021-10-14 ENCOUNTER — Ambulatory Visit (INDEPENDENT_AMBULATORY_CARE_PROVIDER_SITE_OTHER): Payer: Medicare Other

## 2021-10-14 VITALS — Ht 65.0 in | Wt 178.0 lb

## 2021-10-14 DIAGNOSIS — Z0000 Encounter for general adult medical examination without abnormal findings: Secondary | ICD-10-CM

## 2021-10-14 NOTE — Progress Notes (Signed)
I connected with [name] today by telephone and verified that I am speaking with the correct person using two identifiers. Location patient: home Location provider: work Persons participating in the virtual visit: Andrea Hillock LPN.   I discussed the limitations, risks, security and privacy concerns of performing an evaluation and management service by telephone and the availability of in person appointments. I also discussed with the patient that there may be a patient responsible charge related to this service. The patient expressed understanding and verbally consented to this telephonic visit.    Interactive audio and video telecommunications were attempted between this provider and patient, however failed, due to patient having technical difficulties OR patient did not have access to video capability.  We continued and completed visit with audio only.     Vital signs may be patient reported or missing.  Subjective:   [name] is a 60 y.o. female who presents for an Initial Medicare Annual Wellness Visit.  Review of Systems     Cardiac Risk Factors include: none     Objective:    Today's Vitals   10/14/21 1425  Weight: 178 lb (80.7 kg)  Height: 5\' 5"  (1.651 m)  PainSc: 5    Body mass index is 29.62 kg/m.  Advanced Directives 10/14/2021 03/31/2021 06/21/2018 07/13/2014 06/18/2014 06/08/2014 12/23/2012  Does Patient Have a Medical Advance Directive? No No No Yes Patient has advance directive, copy not in chart Patient has advance directive, copy not in chart -  Type of Advance Directive - - - Living will Living will Living will -  Does patient want to make changes to medical advance directive? - - - No - Patient declined - No change requested -  Copy of [place] in Chart? - - - No - copy requested Copy requested from family Copy requested from family -  Would patient like information on creating a medical advance directive? - - No  - Patient declined - - - -  Pre-existing out of facility DNR order (yellow form or pink MOST form) - - - - - - No    Current Medications (verified) Outpatient Encounter Medications as of 10/14/2021  Medication Sig   albuterol (PROAIR HFA) 108 (90 Base) MCG/ACT inhaler Inhale 2 puffs into the lungs every 6 (six) hours as needed for wheezing or shortness of breath.   aspirin 81 MG chewable tablet Chew 81 mg by mouth daily.   benzonatate (TESSALON) 100 MG capsule TAKE TWO CAPSULES BY MOUTH TWICE A DAY AS NEEDED FOR COUGH   DULoxetine (CYMBALTA) 60 MG capsule TAKE ONE CAPSULE BY MOUTH DAILY   estradiol (ESTRACE) 2 MG tablet Take 2 mg by mouth daily.   etanercept (ENBREL SURECLICK) 50 MG/ML injection Enbrel SureClick 50 mg/mL (1 mL) subcutaneous pen injector   fluticasone (FLONASE) 50 MCG/ACT nasal spray Place 1 spray into both nostrils daily.   hydroxychloroquine (PLAQUENIL) 200 MG tablet Take 200 mg by mouth 2 (two) times daily.   Magnesium Oxide (MAG-CAPS PO) Take 215 mg by mouth.   rizatriptan (MAXALT) 10 MG tablet TAKE ONE TABLET BY MOUTH AT ONSET OF HEADACHE; MAY REPEAT ONE TABLET IN 2 HOURS IF NEEDED. NO MORE THAN 2 TABLETS PER DAY   topiramate (TOPAMAX) 100 MG tablet TAKE ONE TABLET BY MOUTH TWICE A DAY   VITAMIN D, CHOLECALCIFEROL, PO Take 1,200 Units by mouth daily.   zolpidem (AMBIEN) 10 MG tablet Take 10 mg by mouth at bedtime as needed for sleep.  amphetamine-dextroamphetamine (ADDERALL) 15 MG tablet Take 1 tablet by mouth daily. (Patient not taking: Reported on 08/29/2021)   clarithromycin (BIAXIN) 500 MG tablet Take 1 tablet (500 mg total) by mouth 2 (two) times daily.   lisinopril-hydrochlorothiazide (PRINZIDE,ZESTORETIC) 10-12.5 MG tablet TAKE 1 TABLET BY MOUTH DAILY (Patient not taking: No sig reported)   testosterone cypionate (DEPOTESTOSTERONE CYPIONATE) 200 MG/ML injection Depo-Testosterone 200 mg/mL intramuscular oil  Given:    GM (Patient not taking: Reported on 06/01/2021)    No facility-administered encounter medications on file as of 10/14/2021.    Allergies (verified) Penicillins   History: Past Medical History:  Diagnosis Date   Anxiety    Decreased libido    on depo testosterone injections from GYN (Dr. Stann Mainland at [facility] 04/2015)   Depression    Hemorrhoids    History of colon polyps    TUBULAR ADENOMA   History of kidney stones    History of pancreatitis    2001   Hydronephrosis, right    IBS (irritable bowel syndrome)    Medullary sponge kidney    Migraine    Mild intermittent asthma    NO INHALER   Pleural fibrosis    MILD SUBPLEURAL FIBROSIS-- LAST CT 2011--  PER PULMOLOGIST NOTE DR SOOD 2011   Past Surgical History:  Procedure Laterality Date   ABDOMINAL HYSTERECTOMY  1996   CESAREAN SECTION  X2   COLONOSCOPY W/ POLYPECTOMY  2004  &  01-16-2011   CYSTOSCOPY WITH RETROGRADE PYELOGRAM, URETEROSCOPY AND STENT PLACEMENT Right 06/18/2014   Procedure: CYSTOSCOPY WITH RETROGRADE PYELOGRAM,  STENT PLACEMENT;  Surgeon: Arvil Persons, MD;  Location: [facility];  Service: Urology;  Laterality: Right;   CYSTOSCOPY/RETROGRADE/URETEROSCOPY Right 07/13/2014   Procedure: CYSTOSCOPY/RETROGRADE/RIGHT URETEROSCOPY AND REMOVAL OF RIGHT STENT;  Surgeon: Jorja Loa, MD;  Location: [facility];  Service: Urology;  Laterality: Right;   CYSTOSCOPY/RETROGRADE/URETEROSCOPY/STONE EXTRACTION WITH BASKET Right 06/08/2014   Procedure: CYSTOSCOPY RIGHT RETROGRADE/URETEROSCOPY/STONE EXTRACTION WITH BASKET WITH STENT PLACEMENT;  Surgeon: Jorja Loa, MD;  Location: [facility];  Service: Urology;  Laterality: Right;   ERCP  06/2000   EXTRACORPOREAL SHOCK WAVE LITHOTRIPSY Right 12-24-2012   HOLMIUM LASER APPLICATION Right 06/08/4579   Procedure:  HOLMIUM LASER APPLICATION;  Surgeon: Jorja Loa, MD;  Location: [facility];  Service: Urology;  Laterality: Right;   HOLMIUM LASER APPLICATION Right 9/98/3382    Procedure: HOLMIUM LASER APPLICATION;  Surgeon: Jorja Loa, MD;  Location: [facility];  Service: Urology;  Laterality: Right;   LAPAROSCOPIC CHOLECYSTECTOMY  [phone]   PARTIAL NEPHRECTOMY Bilateral LEFT  05-18-2006/   RIGHT 1998   LEFT (ANGIOMYOLIPOMA)   RIGHT (BENIGN TUMOR)   TUBAL LIGATION  1993   Family History  Problem Relation Age of Onset   Cancer Father    Social History   Socioeconomic History   Marital status: Divorced    Spouse name: Not on file   Number of children: Not on file   Years of education: Not on file   Highest education level: Not on file  Occupational History   Not on file  Tobacco Use   Smoking status: Never   Smokeless tobacco: Never  Vaping Use   Vaping Use: Never used  Substance and Sexual Activity   Alcohol use: No   Drug use: No   Sexual activity: Yes  Other Topics Concern   Not on file  Social History Narrative   Not on file   Social Determinants of Health  Financial Resource Strain: Low Risk    Difficulty of Paying Living Expenses: Not hard at all  Food Insecurity: No Food Insecurity   Worried About Charity fundraiser in the Last Year: Never true   Ran Out of Food in the Last Year: Never true  Transportation Needs: No Transportation Needs   Lack of Transportation (Medical): No   Lack of Transportation (Non-Medical): No  Physical Activity: Insufficiently Active   Days of Exercise per Week: 2 days   Minutes of Exercise per Session: 40 min  Stress: No Stress Concern Present   Feeling of Stress : Not at all  Social Connections: Not on file    Tobacco Counseling Counseling given: Not Answered   Clinical Intake:  Pre-visit preparation completed: Yes  Pain : 0-10 Pain Score: 5  Pain Type: Chronic pain Pain Location: Hand Pain Orientation: Right, Left Pain Descriptors / Indicators: Aching Pain Onset: More than a month ago Pain Frequency: Constant     Nutritional Status: BMI 25 -29 Overweight Nutritional Risks:  None Diabetes: No  How often do you need to have someone help you when you read instructions, pamphlets, or other written materials from your doctor or pharmacy?: 1 - Never What is the last grade level you completed in school?: 12th grade  Diabetic? no  Interpreter Needed?: No  Information entered by :: NAllen LPN   Activities of Daily Living In your present state of health, do you have any difficulty performing the following activities: 10/14/2021 10/10/2021  Hearing? N N  Vision? N N  Difficulty concentrating or making decisions? N N  Walking or climbing stairs? Y Y  Dressing or bathing? N N  Doing errands, shopping? N Y  Conservation officer, nature and eating ? N N  Using the Toilet? N N  In the past six months, have you accidently leaked urine? N N  Do you have problems with loss of bowel control? N N  Managing your Medications? N N  Managing your Finances? N N  Housekeeping or managing your Housekeeping? N Y  Some recent data might be hidden    Patient Care Team: Denita Lung, MD as PCP - General (Family Medicine)  Indicate any recent Medical Services you may have received from other than [facility] providers in the past year (date may be approximate).     Assessment:   This is a routine wellness examination for [facility].  Hearing/Vision screen Vision Screening - Comments:: Regular eye exams, [facility]  Dietary issues and exercise activities discussed: Current Exercise Habits: Home exercise routine, Type of exercise: walking, Time (Minutes): 20   Goals Addressed             This Visit's Progress    Patient Stated       10/14/2021, no goals       Depression Screen PHQ 2/9 Scores 10/14/2021  PHQ - 2 Score 0    Fall Risk Fall Risk  10/14/2021 10/10/2021  Falls in the past year? 1 1  Comment loses balance and trips -  Number falls in past yr: 1 1  Injury with Fall? 0 -  Risk for fall due to : Impaired balance/gait;Medication side effect -  Follow  up Falls evaluation completed;Education provided;Falls prevention discussed -    FALL RISK PREVENTION PERTAINING TO THE HOME:  Any stairs in or around the home? Yes  If so, are there any without handrails? No  Home free of loose throw rugs in walkways, pet beds, electrical  cords, etc? Yes  Adequate lighting in your home to reduce risk of falls? Yes   ASSISTIVE DEVICES UTILIZED TO PREVENT FALLS:  Life alert? No  Use of a cane, walker or w/c? No  Grab bars in the bathroom? Yes  Shower chair or bench in shower? No  Elevated toilet seat or a handicapped toilet? Yes   TIMED UP AND GO:  Was the test performed? No .      Cognitive Function:     6CIT Screen 10/14/2021  What Year? 0 points  What month? 0 points  What time? 0 points  Count back from 20 0 points  Months in reverse 0 points  Repeat phrase 4 points  Total Score 4    Immunizations Immunization History  Administered Date(s) Administered   Influenza Split 09/12/2011   Influenza,inj,Quad PF,6+ Mos 08/01/2016, 10/03/2018   Influenza-Unspecified 07/28/2018   Moderna Sars-Covid-2 Vaccination 06/04/2020, 10/15/2020   Pneumococcal Conjugate-13 10/03/2018   Pneumococcal Polysaccharide-23 06/01/2021   Tdap 04/08/2018    TDAP status: Up to date  Flu Vaccine status: Due, Education has been provided regarding the importance of this vaccine. Advised may receive this vaccine at local pharmacy or Health Dept. Aware to provide a copy of the vaccination record if obtained from local pharmacy or Health Dept. Verbalized acceptance and understanding.  Pneumococcal vaccine status: Up to date  Covid-19 vaccine status: Completed vaccines  Qualifies for Shingles Vaccine? Yes   Zostavax completed No   Shingrix Completed?: No.    Education has been provided regarding the importance of this vaccine. Patient has been advised to call insurance company to determine out of pocket expense if they have not yet received this vaccine.  Advised may also receive vaccine at local pharmacy or Health Dept. Verbalized acceptance and understanding.  Screening Tests Health Maintenance  Topic Date Due   Zoster Vaccines- Shingrix (1 of 2) Never done   COVID-19 Vaccine (3 - Moderna risk series) 11/12/2020   INFLUENZA VACCINE  06/27/2021   PAP SMEAR-Modifier  07/03/2021   HIV Screening  06/01/2022 (Originally 11/24/1976)   MAMMOGRAM  12/24/2022   Pneumococcal Vaccine 53-26 Years old (3 - PPSV23 if available, else PCV20) 06/01/2026   COLONOSCOPY (Pts 45-71yrs Insurance coverage will need to be confirmed)  04/07/2027   TETANUS/TDAP  04/08/2028   Hepatitis C Screening  Completed   HPV VACCINES  Aged Out    Health Maintenance  Health Maintenance Due  Topic Date Due   Zoster Vaccines- Shingrix (1 of 2) Never done   COVID-19 Vaccine (3 - Moderna risk series) 11/12/2020   INFLUENZA VACCINE  06/27/2021   PAP SMEAR-Modifier  07/03/2021    Colorectal cancer screening: Type of screening: Colonoscopy. Completed 04/06/2017. Repeat every 10 years  Mammogram status: Completed 12/24/2020. Repeat every year  Bone Density status: Completed 01/06/2021.   Lung Cancer Screening: (Low Dose CT Chest recommended if Age 81-80 years, 30 pack-year currently smoking OR have quit w/in 15years.) does not qualify.   Lung Cancer Screening Referral: no  Additional Screening:  Hepatitis C Screening: does qualify; Completed 04/29/2010  Vision Screening: Recommended annual ophthalmology exams for early detection of glaucoma and other disorders of the eye. Is the patient up to date with their annual eye exam?  Yes  Who is the provider or what is the name of the office in which the patient attends annual eye exams? [facility] If pt is not established with a provider, would they like to be referred to a provider to  establish care? No .   Dental Screening: Recommended annual dental exams for proper oral hygiene  Community Resource Referral / Chronic  Care Management: CRR required this visit?  No   CCM required this visit?  No      Plan:     I have personally reviewed and noted the following in the patient's chart:   Medical and social history Use of alcohol, tobacco or illicit drugs  Current medications and supplements including opioid prescriptions. Patient is not currently taking opioid prescriptions. Functional ability and status Nutritional status Physical activity Advanced directives List of other physicians Hospitalizations, surgeries, and ER visits in previous 12 months Vitals Screenings to include cognitive, depression, and falls Referrals and appointments  In addition, I have reviewed and discussed with patient certain preventive protocols, quality metrics, and best practice recommendations. A written personalized care plan for preventive services as well as general preventive health recommendations were provided to patient.     Kellie Simmering, LPN   [id number]   Nurse Notes: none

## 2021-10-14 NOTE — Patient Instructions (Signed)
[name] , Thank you for taking time to come for your Medicare Wellness Visit. I appreciate your ongoing commitment to your health goals. Please review the following plan we discussed and let me know if I can assist you in the future.   Screening recommendations/referrals: Colonoscopy: completed 04/06/2017 Mammogram: completed 12/24/2020 Bone Density: completed 01/06/2021 Recommended yearly ophthalmology/optometry visit for glaucoma screening and checkup Recommended yearly dental visit for hygiene and checkup  Vaccinations: Influenza vaccine: due Pneumococcal vaccine: completed 06/01/2021 Tdap vaccine: completed 04/08/2018, due 04/08/2028 Shingles vaccine: discussed  Covid-19:  06/04/2020, 10/15/2020  Advanced directives: Advance directive discussed with you today.   Conditions/risks identified: none  Next appointment: Follow up in one year for your annual wellness visit.   Preventive Care 40-64 Years, Female Preventive care refers to lifestyle choices and visits with your health care provider that can promote health and wellness. What does preventive care include? A yearly physical exam. This is also called an annual well check. Dental exams once or twice a year. Routine eye exams. Ask your health care provider how often you should have your eyes checked. Personal lifestyle choices, including: Daily care of your teeth and gums. Regular physical activity. Eating a healthy diet. Avoiding tobacco and drug use. Limiting alcohol use. Practicing safe sex. Taking low-dose aspirin daily starting at age 77. Taking vitamin and mineral supplements as recommended by your health care provider. What happens during an annual well check? The services and screenings done by your health care provider during your annual well check will depend on your age, overall health, lifestyle risk factors, and family history of disease. Counseling  Your health care provider may ask you questions about  your: Alcohol use. Tobacco use. Drug use. Emotional well-being. Home and relationship well-being. Sexual activity. Eating habits. Work and work Statistician. Method of birth control. Menstrual cycle. Pregnancy history. Screening  You may have the following tests or measurements: Height, weight, and BMI. Blood pressure. Lipid and cholesterol levels. These may be checked every 5 years, or more frequently if you are over 60 years old. Skin check. Lung cancer screening. You may have this screening every year starting at age 60 if you have a 30-pack-year history of smoking and currently smoke or have quit within the past 15 years. Fecal occult blood test (FOBT) of the stool. You may have this test every year starting at age 60. Flexible sigmoidoscopy or colonoscopy. You may have a sigmoidoscopy every 5 years or a colonoscopy every 10 years starting at age 60. Hepatitis C blood test. Hepatitis B blood test. Sexually transmitted disease (STD) testing. Diabetes screening. This is done by checking your blood sugar (glucose) after you have not eaten for a while (fasting). You may have this done every 1-3 years. Mammogram. This may be done every 1-2 years. Talk to your health care provider about when you should start having regular mammograms. This may depend on whether you have a family history of breast cancer. BRCA-related cancer screening. This may be done if you have a family history of breast, ovarian, tubal, or peritoneal cancers. Pelvic exam and Pap test. This may be done every 3 years starting at age 60. Starting at age 60, this may be done every 5 years if you have a Pap test in combination with an HPV test. Bone density scan. This is done to screen for osteoporosis. You may have this scan if you are at high risk for osteoporosis. Discuss your test results, treatment options, and if necessary, the need for  more tests with your health care provider. Vaccines  Your health care provider may  recommend certain vaccines, such as: Influenza vaccine. This is recommended every year. Tetanus, diphtheria, and acellular pertussis (Tdap, Td) vaccine. You may need a Td booster every 10 years. Zoster vaccine. You may need this after age 60. Pneumococcal 13-valent conjugate (PCV13) vaccine. You may need this if you have certain conditions and were not previously vaccinated. Pneumococcal polysaccharide (PPSV23) vaccine. You may need one or two doses if you smoke cigarettes or if you have certain conditions. Talk to your health care provider about which screenings and vaccines you need and how often you need them. This information is not intended to replace advice given to you by your health care provider. Make sure you discuss any questions you have with your health care provider. Document Released: 12/10/2015 Document Revised: 08/02/2016 Document Reviewed: 09/14/2015 Elsevier Interactive Patient Education  2017 [place] Prevention in the Home Falls can cause injuries. They can happen to people of all ages. There are many things you can do to make your home safe and to help prevent falls. What can I do on the outside of my home? Regularly fix the edges of walkways and driveways and fix any cracks. Remove anything that might make you trip as you walk through a door, such as a raised step or threshold. Trim any bushes or trees on the path to your home. Use bright outdoor lighting. Clear any walking paths of anything that might make someone trip, such as rocks or tools. Regularly check to see if handrails are loose or broken. Make sure that both sides of any steps have handrails. Any raised decks and porches should have guardrails on the edges. Have any leaves, snow, or ice cleared regularly. Use sand or salt on walking paths during winter. Clean up any spills in your garage right away. This includes oil or grease spills. What can I do in the bathroom? Use night lights. Install  grab bars by the toilet and in the tub and shower. Do not use towel bars as grab bars. Use non-skid mats or decals in the tub or shower. If you need to sit down in the shower, use a plastic, non-slip stool. Keep the floor dry. Clean up any water that spills on the floor as soon as it happens. Remove soap buildup in the tub or shower regularly. Attach bath mats securely with double-sided non-slip rug tape. Do not have throw rugs and other things on the floor that can make you trip. What can I do in the bedroom? Use night lights. Make sure that you have a light by your bed that is easy to reach. Do not use any sheets or blankets that are too big for your bed. They should not hang down onto the floor. Have a firm chair that has side arms. You can use this for support while you get dressed. Do not have throw rugs and other things on the floor that can make you trip. What can I do in the kitchen? Clean up any spills right away. Avoid walking on wet floors. Keep items that you use a lot in easy-to-reach places. If you need to reach something above you, use a strong step stool that has a grab bar. Keep electrical cords out of the way. Do not use floor polish or wax that makes floors slippery. If you must use wax, use non-skid floor wax. Do not have throw rugs and other things  on the floor that can make you trip. What can I do with my stairs? Do not leave any items on the stairs. Make sure that there are handrails on both sides of the stairs and use them. Fix handrails that are broken or loose. Make sure that handrails are as long as the stairways. Check any carpeting to make sure that it is firmly attached to the stairs. Fix any carpet that is loose or worn. Avoid having throw rugs at the top or bottom of the stairs. If you do have throw rugs, attach them to the floor with carpet tape. Make sure that you have a light switch at the top of the stairs and the bottom of the stairs. If you do not have  them, ask someone to add them for you. What else can I do to help prevent falls? Wear shoes that: Do not have high heels. Have rubber bottoms. Are comfortable and fit you well. Are closed at the toe. Do not wear sandals. If you use a stepladder: Make sure that it is fully opened. Do not climb a closed stepladder. Make sure that both sides of the stepladder are locked into place. Ask someone to hold it for you, if possible. Clearly mark and make sure that you can see: Any grab bars or handrails. First and last steps. Where the edge of each step is. Use tools that help you move around (mobility aids) if they are needed. These include: Canes. Walkers. Scooters. Crutches. Turn on the lights when you go into a dark area. Replace any light bulbs as soon as they burn out. Set up your furniture so you have a clear path. Avoid moving your furniture around. If any of your floors are uneven, fix them. If there are any pets around you, be aware of where they are. Review your medicines with your doctor. Some medicines can make you feel dizzy. This can increase your chance of falling. Ask your doctor what other things that you can do to help prevent falls. This information is not intended to replace advice given to you by your health care provider. Make sure you discuss any questions you have with your health care provider. Document Released: 09/09/2009 Document Revised: 04/20/2016 Document Reviewed: 12/18/2014 Elsevier Interactive Patient Education  2017 [employer].

## 2021-11-03 ENCOUNTER — Other Ambulatory Visit: Payer: Self-pay | Admitting: Family Medicine

## 2021-12-08 DIAGNOSIS — Z79891 Long term (current) use of opiate analgesic: Secondary | ICD-10-CM | POA: Diagnosis not present

## 2021-12-08 DIAGNOSIS — G894 Chronic pain syndrome: Secondary | ICD-10-CM | POA: Diagnosis not present

## 2021-12-08 DIAGNOSIS — M1710 Unilateral primary osteoarthritis, unspecified knee: Secondary | ICD-10-CM | POA: Diagnosis not present

## 2021-12-08 DIAGNOSIS — M069 Rheumatoid arthritis, unspecified: Secondary | ICD-10-CM | POA: Diagnosis not present

## 2021-12-19 DIAGNOSIS — K219 Gastro-esophageal reflux disease without esophagitis: Secondary | ICD-10-CM | POA: Diagnosis not present

## 2021-12-27 DIAGNOSIS — Z79899 Other long term (current) drug therapy: Secondary | ICD-10-CM | POA: Diagnosis not present

## 2021-12-27 DIAGNOSIS — M0579 Rheumatoid arthritis with rheumatoid factor of multiple sites without organ or systems involvement: Secondary | ICD-10-CM | POA: Diagnosis not present

## 2021-12-27 DIAGNOSIS — R5382 Chronic fatigue, unspecified: Secondary | ICD-10-CM | POA: Diagnosis not present

## 2021-12-27 DIAGNOSIS — M2550 Pain in unspecified joint: Secondary | ICD-10-CM | POA: Diagnosis not present

## 2021-12-27 DIAGNOSIS — M150 Primary generalized (osteo)arthritis: Secondary | ICD-10-CM | POA: Diagnosis not present

## 2022-01-05 ENCOUNTER — Other Ambulatory Visit: Payer: Self-pay | Admitting: Family Medicine

## 2022-01-06 NOTE — Telephone Encounter (Signed)
[facility] is requesting to fill pt topiramate. Pt has not lab since 2021 and has future appt with Maddy soon. Please advise. [place]

## 2022-01-27 ENCOUNTER — Ambulatory Visit
Admission: RE | Admit: 2022-01-27 | Discharge: 2022-01-27 | Disposition: A | Discharge status: Home / Self Care | Payer: Medicare Other | Source: Ambulatory Visit | Attending: Gastroenterology | Admitting: Gastroenterology

## 2022-01-27 ENCOUNTER — Other Ambulatory Visit: Payer: Self-pay | Admitting: Gastroenterology

## 2022-01-27 DIAGNOSIS — R1031 Right lower quadrant pain: Secondary | ICD-10-CM

## 2022-01-27 DIAGNOSIS — N1330 Unspecified hydronephrosis: Secondary | ICD-10-CM | POA: Diagnosis not present

## 2022-01-27 MED ORDER — IOPAMIDOL (ISOVUE-300) INJECTION 61%
100.0000 mL | Freq: Once | INTRAVENOUS | Status: AC | PRN
  Administered 2022-01-27: 100 mL via INTRAVENOUS

## 2022-01-30 ENCOUNTER — Other Ambulatory Visit: Payer: Self-pay | Admitting: Urology

## 2022-01-30 ENCOUNTER — Encounter (HOSPITAL_BASED_OUTPATIENT_CLINIC_OR_DEPARTMENT_OTHER)
Admission: RE | Disposition: A | Discharge status: Home / Self Care | Payer: Self-pay | Source: Ambulatory Visit | Attending: Urology

## 2022-01-30 ENCOUNTER — Ambulatory Visit (HOSPITAL_BASED_OUTPATIENT_CLINIC_OR_DEPARTMENT_OTHER)
Admission: RE | Admit: 2022-01-30 | Discharge: 2022-01-30 | Disposition: A | Discharge status: Home / Self Care | Payer: Medicare Other | Source: Ambulatory Visit | Attending: Urology | Admitting: Urology

## 2022-01-30 ENCOUNTER — Ambulatory Visit (HOSPITAL_COMMUNITY): Payer: Medicare Other

## 2022-01-30 ENCOUNTER — Encounter (HOSPITAL_BASED_OUTPATIENT_CLINIC_OR_DEPARTMENT_OTHER): Payer: Self-pay | Admitting: Urology

## 2022-01-30 DIAGNOSIS — N200 Calculus of kidney: Secondary | ICD-10-CM | POA: Diagnosis not present

## 2022-01-30 DIAGNOSIS — N201 Calculus of ureter: Secondary | ICD-10-CM | POA: Diagnosis not present

## 2022-01-30 DIAGNOSIS — I878 Other specified disorders of veins: Secondary | ICD-10-CM | POA: Diagnosis not present

## 2022-01-30 DIAGNOSIS — R1084 Generalized abdominal pain: Secondary | ICD-10-CM | POA: Diagnosis not present

## 2022-01-30 DIAGNOSIS — N202 Calculus of kidney with calculus of ureter: Secondary | ICD-10-CM | POA: Diagnosis not present

## 2022-01-30 HISTORY — PX: EXTRACORPOREAL SHOCK WAVE LITHOTRIPSY: SHX1557

## 2022-01-30 SURGERY — LITHOTRIPSY, ESWL
Anesthesia: LOCAL | Laterality: Right

## 2022-01-30 MED ORDER — DIAZEPAM 5 MG PO TABS
10.0000 mg | ORAL_TABLET | ORAL | Status: AC
  Administered 2022-01-30: 10 mg via ORAL

## 2022-01-30 MED ORDER — DIAZEPAM 5 MG PO TABS
ORAL_TABLET | ORAL | Status: AC
  Filled 2022-01-30: qty 2

## 2022-01-30 MED ORDER — DIPHENHYDRAMINE HCL 25 MG PO CAPS
25.0000 mg | ORAL_CAPSULE | ORAL | Status: AC
  Administered 2022-01-30: 25 mg via ORAL

## 2022-01-30 MED ORDER — CIPROFLOXACIN HCL 500 MG PO TABS: 500.0000 mg | ORAL_TABLET | Freq: Two times a day (BID) | ORAL | 0 refills | Status: AC

## 2022-01-30 MED ORDER — DIPHENHYDRAMINE HCL 25 MG PO CAPS
ORAL_CAPSULE | ORAL | Status: AC
  Filled 2022-01-30: qty 1

## 2022-01-30 MED ORDER — ONDANSETRON HCL 4 MG PO TABS: 4.0000 mg | ORAL_TABLET | Freq: Every day | ORAL | 1 refills | Status: AC | PRN

## 2022-01-30 MED ORDER — SODIUM CHLORIDE 0.9 % IV SOLN: INTRAVENOUS | Status: DC

## 2022-01-30 MED ORDER — CIPROFLOXACIN HCL 500 MG PO TABS
ORAL_TABLET | ORAL | Status: AC
  Filled 2022-01-30: qty 1

## 2022-01-30 MED ORDER — CIPROFLOXACIN HCL 500 MG PO TABS
500.0000 mg | ORAL_TABLET | ORAL | Status: AC
  Administered 2022-01-30: 500 mg via ORAL

## 2022-01-30 MED ORDER — OXYCODONE-ACETAMINOPHEN 5-325 MG PO TABS: 1.0000 | ORAL_TABLET | ORAL | 0 refills | Status: DC | PRN

## 2022-01-30 NOTE — Discharge Instructions (Signed)

## 2022-01-30 NOTE — H&P (Signed)
See scanned [facility] documents for H&P.   

## 2022-01-30 NOTE — Op Note (Signed)
[facility] Operative Note ? ?Treating Physician: Ellison Hughs, MD ? ?Pre-op diagnosis: 6 mm right mid-ureteral  ? ?Post-op diagnosis: Same  ? ?Procedure: Right ESWL  ? ?See [employer] OP note scanned into chart. Also because of the size, density, location and other factors that cannot be anticipated I feel this will likely be a staged procedure. This fact supersedes any indication in the scanned [place] [facility] operative note to the contrary ? ? ?

## 2022-01-31 ENCOUNTER — Encounter (HOSPITAL_BASED_OUTPATIENT_CLINIC_OR_DEPARTMENT_OTHER): Payer: Self-pay | Admitting: Urology

## 2022-02-06 ENCOUNTER — Ambulatory Visit: Payer: Medicare Other | Admitting: Family Medicine

## 2022-02-09 ENCOUNTER — Telehealth (INDEPENDENT_AMBULATORY_CARE_PROVIDER_SITE_OTHER): Payer: Medicare Other | Admitting: Family Medicine

## 2022-02-09 ENCOUNTER — Other Ambulatory Visit: Payer: Self-pay

## 2022-02-09 VITALS — BP 123/76 | Wt 186.0 lb

## 2022-02-09 DIAGNOSIS — K219 Gastro-esophageal reflux disease without esophagitis: Secondary | ICD-10-CM | POA: Diagnosis not present

## 2022-02-09 DIAGNOSIS — E6609 Other obesity due to excess calories: Secondary | ICD-10-CM | POA: Diagnosis not present

## 2022-02-09 DIAGNOSIS — J069 Acute upper respiratory infection, unspecified: Secondary | ICD-10-CM

## 2022-02-09 DIAGNOSIS — M05741 Rheumatoid arthritis with rheumatoid factor of right hand without organ or systems involvement: Secondary | ICD-10-CM

## 2022-02-09 DIAGNOSIS — Z6830 Body mass index (BMI) 30.0-30.9, adult: Secondary | ICD-10-CM

## 2022-02-09 DIAGNOSIS — M05742 Rheumatoid arthritis with rheumatoid factor of left hand without organ or systems involvement: Secondary | ICD-10-CM | POA: Diagnosis not present

## 2022-02-09 NOTE — Progress Notes (Signed)
? ?  Subjective:  ? ? Patient ID: [name], female    DOB: 10/27/1961, 61 y.o.   MRN: [id number] ? ?HPI ?Documentation for virtual audio and video telecommunications through [place] encounter: ?The patient was located at home. 2 patient identifiers used.  ?The provider was located in the office. ?The patient did consent to this visit and is aware of possible charges through their insurance for this visit. ?The other persons participating in this telemedicine service were none. ?Time spent on call was 5 minutes and in review of previous records >20 minutes total for counseling and coordination of care. ?This virtual service is not related to other E/M service within previous 7 days.  ?She complains of a 1 day history of sore throat, cough, occasional wheezing as well as some right-sided maxillary pressure.  She has been using an inhaler on occasion.  She was recently seen by her gastroenterologist for her reflux.  He mentioned the benefits of weight loss to help with her reflux symptoms.  She also sees her rheumatologist regularly for her rheumatoid arthritis and again encouraged to lose weight to help with that. ? ?Review of Systems ? ?   ?Objective:  ? Physical Exam ?Alert and in no distress otherwise not examined ? ? ? ?   ?Assessment & Plan:  ?Viral upper respiratory tract infection ? ?Gastroesophageal reflux disease, unspecified whether esophagitis present ? ?Rheumatoid arthritis involving both hands with positive rheumatoid factor ([place]) ? ?Class 1 obesity due to excess calories with body mass index (BMI) of 30.0 to 30.9 in adult, unspecified whether serious comorbidity present ?I explained that since she just started having symptoms that sound viral, treated conservatively and keep me informed especially since she is on DMARD.  She was comfortable with that. ?I then discussed weight loss with her in regard to possible options.  Surgical options would mention however she is not interested and I agree.   I recommend that she check with her insurance to see if Mancel Parsons would be covered.  If that is the case she is to set up an appointment for further discussion concerning use of medication possible side effects and follow-up. ? ?

## 2022-02-13 DIAGNOSIS — N202 Calculus of kidney with calculus of ureter: Secondary | ICD-10-CM | POA: Diagnosis not present

## 2022-02-14 ENCOUNTER — Ambulatory Visit (INDEPENDENT_AMBULATORY_CARE_PROVIDER_SITE_OTHER): Payer: Medicare Other | Admitting: Family Medicine

## 2022-02-14 ENCOUNTER — Encounter: Payer: Self-pay | Admitting: Family Medicine

## 2022-02-14 VITALS — BP 144/86 | HR 65 | Temp 98.2°F | Wt 189.6 lb

## 2022-02-14 DIAGNOSIS — E6609 Other obesity due to excess calories: Secondary | ICD-10-CM | POA: Diagnosis not present

## 2022-02-14 DIAGNOSIS — Z6830 Body mass index (BMI) 30.0-30.9, adult: Secondary | ICD-10-CM | POA: Diagnosis not present

## 2022-02-14 DIAGNOSIS — I10 Essential (primary) hypertension: Secondary | ICD-10-CM

## 2022-02-14 MED ORDER — LISINOPRIL-HYDROCHLOROTHIAZIDE 10-12.5 MG PO TABS: 1.0000 | ORAL_TABLET | Freq: Every day | ORAL | 3 refills | Status: DC

## 2022-02-14 MED ORDER — WEGOVY 0.5 MG/0.5ML ~~LOC~~ SOAJ: 0.5000 mg | SUBCUTANEOUS | 1 refills | Status: DC

## 2022-02-14 NOTE — Progress Notes (Signed)
? ?  Subjective:  ? ? Patient ID: [name], female    DOB: October 08, 1961, 61 y.o.   MRN: [id number] ? ?HPI ?She is here for consult concerning her blood pressure and also getting put on Wegovy.  She was on blood pressure medication in the past and stopped it due to her blood pressure remaining well however she is noted over the last several months, pressure has increased.  She would like to be placed back on a previous medication regimen that worked for her.  She also checked with your insurance company and based on her BMI of being over 30 hand the fact that she has rheumatoid arthritis, she would qualify. ? ? ?Review of Systems ? ?   ?Objective:  ? Physical Exam ?Alert and in no distress.  Blood pressure is recorded. ? ? ? ?   ?Assessment & Plan:  ?Essential hypertension - Plan: lisinopril-hydrochlorothiazide (ZESTORETIC) 10-12.5 MG tablet ? ?Class 1 obesity due to excess calories with body mass index (BMI) of 30.0 to 30.9 in adult, unspecified whether serious comorbidity present - Plan: Semaglutide-Weight Management (WEGOVY) 0.5 MG/0.5ML SOAJ ?I will place her back on the medication that worked in the past.  Plan to recheck this in roughly 2 months.  She will return in 2 months also to discuss how she is doing on Wegovy.  Discussed the fact that she still needs to be vigilant with her diet and exercise.  She will set up a return appointment in 2 months. ? ?

## 2022-02-15 ENCOUNTER — Telehealth: Payer: Self-pay

## 2022-02-15 NOTE — Telephone Encounter (Signed)
Pt called & states she called insurance company and was told as long as she had BMI of over 30 & another risk factor P.A. for Mancel Parsons would be approved, said to call [phone]

## 2022-02-18 NOTE — Telephone Encounter (Signed)
P.A. WEGOVY completed  

## 2022-02-20 ENCOUNTER — Encounter: Payer: Self-pay | Admitting: Family Medicine

## 2022-02-23 NOTE — Telephone Encounter (Signed)
P.A. Mancel Parsons denied, plan exclusion.  Med D will not cover any weight loss meds.  Called pt and informed

## 2022-02-23 NOTE — Telephone Encounter (Signed)
P.A. denied, plan exclusion.  Med D will not cover any weight loss meds.  Called pt and informed

## 2022-02-24 NOTE — Telephone Encounter (Signed)
Pt was called and advised and she wants to think about the medical weight loss. kh ?

## 2022-03-02 DIAGNOSIS — Z0001 Encounter for general adult medical examination with abnormal findings: Secondary | ICD-10-CM | POA: Diagnosis not present

## 2022-03-02 DIAGNOSIS — R300 Dysuria: Secondary | ICD-10-CM | POA: Diagnosis not present

## 2022-03-02 DIAGNOSIS — Z1231 Encounter for screening mammogram for malignant neoplasm of breast: Secondary | ICD-10-CM | POA: Diagnosis not present

## 2022-03-02 LAB — BASIC METABOLIC PANEL
BUN: 21 (ref 4–21)
CO2: 22 (ref 13–22)
Chloride: 101 (ref 99–108)
Creatinine: 1.2 — AB (ref <=1.1)
Glucose: 92
Potassium: 3.6 mEq/L (ref 3.5–5.1)
Sodium: 141 (ref 137–147)

## 2022-03-02 LAB — LIPID PANEL
Cholesterol: 212 — AB (ref 0–200)
HDL: 70 (ref 35–70)
LDL Cholesterol: 121
Triglycerides: 118 (ref 40–160)

## 2022-03-02 LAB — CBC: RBC: 4.82 (ref 3.87–5.11)

## 2022-03-02 LAB — HEPATIC FUNCTION PANEL
ALT: 11 U/L (ref 7–35)
AST: 17 (ref 13–35)
Alkaline Phosphatase: 92 (ref 25–125)
Bilirubin, Total: 0.3

## 2022-03-02 LAB — CBC AND DIFFERENTIAL
Neutrophils Absolute: 64
WBC: 7.8

## 2022-03-02 LAB — COMPREHENSIVE METABOLIC PANEL
Albumin: 4.9 (ref 3.5–5.0)
Calcium: 9.9 (ref 8.7–10.7)
Globulin: 2.2
eGFR: 50

## 2022-03-02 LAB — HEMOGLOBIN A1C: Hemoglobin A1C: 5.1

## 2022-03-02 LAB — TSH: TSH: 0.86 (ref <=5.90)

## 2022-03-02 LAB — HM MAMMOGRAPHY

## 2022-03-05 ENCOUNTER — Other Ambulatory Visit: Payer: Self-pay | Admitting: Family Medicine

## 2022-03-07 NOTE — Telephone Encounter (Signed)
Lvm for pt to call back and advise if she needed this before he appt 04/18/22. [place] ?

## 2022-03-16 ENCOUNTER — Encounter: Payer: Self-pay | Admitting: Family Medicine

## 2022-04-17 ENCOUNTER — Ambulatory Visit: Payer: Medicare Other | Admitting: Family Medicine

## 2022-04-20 ENCOUNTER — Other Ambulatory Visit: Payer: Self-pay | Admitting: Family Medicine

## 2022-05-24 ENCOUNTER — Other Ambulatory Visit: Payer: Self-pay | Admitting: Family Medicine

## 2022-05-24 DIAGNOSIS — N200 Calculus of kidney: Secondary | ICD-10-CM | POA: Diagnosis not present

## 2022-05-24 DIAGNOSIS — Z87442 Personal history of urinary calculi: Secondary | ICD-10-CM | POA: Diagnosis not present

## 2022-05-25 NOTE — Telephone Encounter (Signed)
[facility] is requesting to fill pt duloxetine . Please advise [facility]

## 2022-06-21 ENCOUNTER — Other Ambulatory Visit: Payer: Self-pay | Admitting: Family Medicine

## 2022-06-26 DIAGNOSIS — G894 Chronic pain syndrome: Secondary | ICD-10-CM | POA: Diagnosis not present

## 2022-06-26 DIAGNOSIS — R5382 Chronic fatigue, unspecified: Secondary | ICD-10-CM | POA: Diagnosis not present

## 2022-06-26 DIAGNOSIS — Z79899 Other long term (current) drug therapy: Secondary | ICD-10-CM | POA: Diagnosis not present

## 2022-06-26 DIAGNOSIS — M0579 Rheumatoid arthritis with rheumatoid factor of multiple sites without organ or systems involvement: Secondary | ICD-10-CM | POA: Diagnosis not present

## 2022-06-26 DIAGNOSIS — M1991 Primary osteoarthritis, unspecified site: Secondary | ICD-10-CM | POA: Diagnosis not present

## 2022-08-02 ENCOUNTER — Encounter: Payer: Self-pay | Admitting: Internal Medicine

## 2022-08-29 ENCOUNTER — Other Ambulatory Visit: Payer: Self-pay | Admitting: Family Medicine

## 2022-08-29 NOTE — Telephone Encounter (Signed)
[facility] is requesting to fill pt topiramate. Please advise [facility]

## 2022-09-03 ENCOUNTER — Other Ambulatory Visit: Payer: Self-pay | Admitting: Family Medicine

## 2022-09-03 DIAGNOSIS — G43809 Other migraine, not intractable, without status migrainosus: Secondary | ICD-10-CM

## 2022-09-04 NOTE — Telephone Encounter (Signed)
[facility] is requesting to fill pt maxalt. Please advise [facility]

## 2022-09-05 ENCOUNTER — Encounter: Payer: Self-pay | Admitting: Internal Medicine

## 2022-09-18 ENCOUNTER — Encounter: Payer: Self-pay | Admitting: Internal Medicine

## 2022-09-22 ENCOUNTER — Ambulatory Visit: Payer: Medicare Other | Admitting: Family Medicine

## 2022-09-26 ENCOUNTER — Ambulatory Visit (INDEPENDENT_AMBULATORY_CARE_PROVIDER_SITE_OTHER): Payer: Medicare Other | Admitting: Family Medicine

## 2022-09-26 VITALS — BP 150/100 | HR 68 | Ht 65.0 in | Wt 191.2 lb

## 2022-09-26 DIAGNOSIS — I10 Essential (primary) hypertension: Secondary | ICD-10-CM | POA: Diagnosis not present

## 2022-09-26 MED ORDER — LOSARTAN POTASSIUM-HCTZ 100-12.5 MG PO TABS: 1.0000 | ORAL_TABLET | Freq: Every day | ORAL | 0 refills | Status: DC

## 2022-09-26 NOTE — Progress Notes (Signed)
   Subjective:    Patient ID: [name], female    DOB: 08/31/1961, 61 y.o.   MRN: [id number]  HPI She is here for consult concerning her blood pressure.  She states that she has not been able to keep this in a good range.  She has been taking lisinopril/HCTZ.  She states that when she takes lisinopril, it causes dizziness and would like to switch to a different medicine.   Review of Systems     Objective:   Physical Exam Alert and in no distress.  Review of blood pressure indicates that she has been above the normal range.  Also her weight is up slightly.       Assessment & Plan:  Essential hypertension - Plan: losartan-hydrochlorothiazide (HYZAAR) 100-12.5 MG tablet She is to return here in 1 month and bring her blood pressure cuff with her.

## 2022-10-03 ENCOUNTER — Other Ambulatory Visit: Payer: Self-pay | Admitting: Family Medicine

## 2022-10-03 DIAGNOSIS — J209 Acute bronchitis, unspecified: Secondary | ICD-10-CM

## 2022-10-03 DIAGNOSIS — J45909 Unspecified asthma, uncomplicated: Secondary | ICD-10-CM

## 2022-10-04 NOTE — Telephone Encounter (Signed)
Is this okay to refill? 

## 2022-10-25 ENCOUNTER — Encounter: Payer: Self-pay | Admitting: Family Medicine

## 2022-10-25 ENCOUNTER — Ambulatory Visit (INDEPENDENT_AMBULATORY_CARE_PROVIDER_SITE_OTHER): Payer: Medicare Other | Admitting: Family Medicine

## 2022-10-25 VITALS — BP 130/82 | HR 78 | Temp 98.5°F | Wt 190.8 lb

## 2022-10-25 DIAGNOSIS — I10 Essential (primary) hypertension: Secondary | ICD-10-CM

## 2022-10-25 MED ORDER — LOSARTAN POTASSIUM-HCTZ 100-12.5 MG PO TABS: 1.0000 | ORAL_TABLET | Freq: Every day | ORAL | 3 refills | Status: DC

## 2022-10-25 NOTE — Progress Notes (Signed)
   Subjective:    Patient ID: [name], female    DOB: [birth year], 61 y.o.   MRN: [id number]  HPI She is here for a recheck.  She was switched to a different antihypertensive medication and is having no difficulty with that.  She has however had continued difficulty with intermittent cough causing her to have to use her inhaler and occasional use of Tussionex.  She is using Flonase but has had some nosebleeds.   Review of Systems     Objective:   Physical Exam Alert and in no distress.  Blood pressure is recorded.  Cardiac exam shows regular rhythm without murmurs gallops.  Lungs are clear to auscultation.       Assessment & Plan:  Essential hypertension - Plan: losartan-hydrochlorothiazide (HYZAAR) 100-12.5 MG tablet Continue on the Hyzaar.  Also recommend she switch from Flonase to Rhinocort to help the nasal bleeding.  If she continues have difficulty with the coughing, further intervention may be needed.  She was comfortable with that.

## 2022-10-26 DIAGNOSIS — L905 Scar conditions and fibrosis of skin: Secondary | ICD-10-CM | POA: Diagnosis not present

## 2022-10-26 DIAGNOSIS — D225 Melanocytic nevi of trunk: Secondary | ICD-10-CM | POA: Diagnosis not present

## 2022-10-26 DIAGNOSIS — L821 Other seborrheic keratosis: Secondary | ICD-10-CM | POA: Diagnosis not present

## 2022-10-26 DIAGNOSIS — L820 Inflamed seborrheic keratosis: Secondary | ICD-10-CM | POA: Diagnosis not present

## 2022-10-26 DIAGNOSIS — D485 Neoplasm of uncertain behavior of skin: Secondary | ICD-10-CM | POA: Diagnosis not present

## 2022-10-27 ENCOUNTER — Ambulatory Visit: Payer: Medicare Other

## 2022-10-31 ENCOUNTER — Other Ambulatory Visit: Payer: Self-pay | Admitting: Family Medicine

## 2022-11-02 ENCOUNTER — Telehealth: Payer: Self-pay | Admitting: Family Medicine

## 2022-11-02 NOTE — Telephone Encounter (Signed)
Left message for patient to call back and schedule Medicare Annual Wellness Visit (AWV) either virtually or in office. Left  my [name] number [phone]   Last AWV 10/14/21 please schedule with Nurse Health Adviser   45 min for awv-i and in office appointments 30 min for awv-s  phone/virtual appointments

## 2022-11-30 DIAGNOSIS — L818 Other specified disorders of pigmentation: Secondary | ICD-10-CM | POA: Diagnosis not present

## 2022-11-30 DIAGNOSIS — D485 Neoplasm of uncertain behavior of skin: Secondary | ICD-10-CM | POA: Diagnosis not present

## 2022-11-30 DIAGNOSIS — L988 Other specified disorders of the skin and subcutaneous tissue: Secondary | ICD-10-CM | POA: Diagnosis not present

## 2022-12-29 ENCOUNTER — Other Ambulatory Visit: Payer: Self-pay | Admitting: Family Medicine

## 2022-12-29 DIAGNOSIS — I10 Essential (primary) hypertension: Secondary | ICD-10-CM

## 2023-01-02 ENCOUNTER — Ambulatory Visit (INDEPENDENT_AMBULATORY_CARE_PROVIDER_SITE_OTHER): Payer: Medicare Other

## 2023-01-02 ENCOUNTER — Other Ambulatory Visit: Payer: Self-pay | Admitting: Family Medicine

## 2023-01-02 VITALS — Ht 65.0 in | Wt 187.0 lb

## 2023-01-02 DIAGNOSIS — Z0000 Encounter for general adult medical examination without abnormal findings: Secondary | ICD-10-CM

## 2023-01-02 DIAGNOSIS — I10 Essential (primary) hypertension: Secondary | ICD-10-CM

## 2023-01-02 NOTE — Telephone Encounter (Signed)
Is it ok to refill medication? 

## 2023-01-02 NOTE — Patient Instructions (Signed)
[name] , Thank you for taking time to come for your Medicare Wellness Visit. I appreciate your ongoing commitment to your health goals. Please review the following plan we discussed and let me know if I can assist you in the future.   These are the goals we discussed:  Goals      Patient Stated     10/14/2021, no goals     Patient Stated     01/02/2023, wantsto lose weight        This is a list of the screening recommended for you and due dates:  Health Maintenance  Topic Date Due   HIV Screening  Never done   COVID-19 Vaccine (3 - Moderna risk series) 11/12/2020   Zoster (Shingles) Vaccine (2 of 2) 10/02/2022   Flu Shot  02/25/2023*   Medicare Annual Wellness Visit  01/03/2024   Mammogram  03/02/2024   Colon Cancer Screening  04/07/2027   DTaP/Tdap/Td vaccine (2 - Td or Tdap) 04/08/2028   Hepatitis C Screening: USPSTF Recommendation to screen - Ages 11-79 yo.  Completed   HPV Vaccine  Aged Out  *Topic was postponed. The date shown is not the original due date.    Advanced directives: Advance directive discussed with you today. .  Conditions/risks identified: none  Next appointment: Follow up in one year for your annual wellness visit.   Preventive Care 40-64 Years, Female Preventive care refers to lifestyle choices and visits with your health care provider that can promote health and wellness. What does preventive care include? A yearly physical exam. This is also called an annual well check. Dental exams once or twice a year. Routine eye exams. Ask your health care provider how often you should have your eyes checked. Personal lifestyle choices, including: Daily care of your teeth and gums. Regular physical activity. Eating a healthy diet. Avoiding tobacco and drug use. Limiting alcohol use. Practicing safe sex. Taking low-dose aspirin daily starting at age 17. Taking vitamin and mineral supplements as recommended by your health care provider. What happens  during an annual well check? The services and screenings done by your health care provider during your annual well check will depend on your age, overall health, lifestyle risk factors, and family history of disease. Counseling  Your health care provider may ask you questions about your: Alcohol use. Tobacco use. Drug use. Emotional well-being. Home and relationship well-being. Sexual activity. Eating habits. Work and work Statistician. Method of birth control. Menstrual cycle. Pregnancy history. Screening  You may have the following tests or measurements: Height, weight, and BMI. Blood pressure. Lipid and cholesterol levels. These may be checked every 5 years, or more frequently if you are over 31 years old. Skin check. Lung cancer screening. You may have this screening every year starting at age 56 if you have a 30-pack-year history of smoking and currently smoke or have quit within the past 15 years. Fecal occult blood test (FOBT) of the stool. You may have this test every year starting at age 6. Flexible sigmoidoscopy or colonoscopy. You may have a sigmoidoscopy every 5 years or a colonoscopy every 10 years starting at age 13. Hepatitis C blood test. Hepatitis B blood test. Sexually transmitted disease (STD) testing. Diabetes screening. This is done by checking your blood sugar (glucose) after you have not eaten for a while (fasting). You may have this done every 1-3 years. Mammogram. This may be done every 1-2 years. Talk to your health care provider about when you should start  having regular mammograms. This may depend on whether you have a family history of breast cancer. BRCA-related cancer screening. This may be done if you have a family history of breast, ovarian, tubal, or peritoneal cancers. Pelvic exam and Pap test. This may be done every 3 years starting at age 34. Starting at age 65, this may be done every 5 years if you have a Pap test in combination with an HPV  test. Bone density scan. This is done to screen for osteoporosis. You may have this scan if you are at high risk for osteoporosis. Discuss your test results, treatment options, and if necessary, the need for more tests with your health care provider. Vaccines  Your health care provider may recommend certain vaccines, such as: Influenza vaccine. This is recommended every year. Tetanus, diphtheria, and acellular pertussis (Tdap, Td) vaccine. You may need a Td booster every 10 years. Zoster vaccine. You may need this after age 78. Pneumococcal 13-valent conjugate (PCV13) vaccine. You may need this if you have certain conditions and were not previously vaccinated. Pneumococcal polysaccharide (PPSV23) vaccine. You may need one or two doses if you smoke cigarettes or if you have certain conditions. Talk to your health care provider about which screenings and vaccines you need and how often you need them. This information is not intended to replace advice given to you by your health care provider. Make sure you discuss any questions you have with your health care provider. Document Released: 12/10/2015 Document Revised: 08/02/2016 Document Reviewed: 09/14/2015 Elsevier Interactive Patient Education  2017 [employer] Prevention in the Home Falls can cause injuries. They can happen to people of all ages. There are many things you can do to make your home safe and to help prevent falls. What can I do on the outside of my home? Regularly fix the edges of walkways and driveways and fix any cracks. Remove anything that might make you trip as you walk through a door, such as a raised step or threshold. Trim any bushes or trees on the path to your home. Use bright outdoor lighting. Clear any walking paths of anything that might make someone trip, such as rocks or tools. Regularly check to see if handrails are loose or broken. Make sure that both sides of any steps have handrails. Any raised  decks and porches should have guardrails on the edges. Have any leaves, snow, or ice cleared regularly. Use sand or salt on walking paths during winter. Clean up any spills in your garage right away. This includes oil or grease spills. What can I do in the bathroom? Use night lights. Install grab bars by the toilet and in the tub and shower. Do not use towel bars as grab bars. Use non-skid mats or decals in the tub or shower. If you need to sit down in the shower, use a plastic, non-slip stool. Keep the floor dry. Clean up any water that spills on the floor as soon as it happens. Remove soap buildup in the tub or shower regularly. Attach bath mats securely with double-sided non-slip rug tape. Do not have throw rugs and other things on the floor that can make you trip. What can I do in the bedroom? Use night lights. Make sure that you have a light by your bed that is easy to reach. Do not use any sheets or blankets that are too big for your bed. They should not hang down onto the floor. Have a firm chair that  has side arms. You can use this for support while you get dressed. Do not have throw rugs and other things on the floor that can make you trip. What can I do in the kitchen? Clean up any spills right away. Avoid walking on wet floors. Keep items that you use a lot in easy-to-reach places. If you need to reach something above you, use a strong step stool that has a grab bar. Keep electrical cords out of the way. Do not use floor polish or wax that makes floors slippery. If you must use wax, use non-skid floor wax. Do not have throw rugs and other things on the floor that can make you trip. What can I do with my stairs? Do not leave any items on the stairs. Make sure that there are handrails on both sides of the stairs and use them. Fix handrails that are broken or loose. Make sure that handrails are as long as the stairways. Check any carpeting to make sure that it is firmly attached  to the stairs. Fix any carpet that is loose or worn. Avoid having throw rugs at the top or bottom of the stairs. If you do have throw rugs, attach them to the floor with carpet tape. Make sure that you have a light switch at the top of the stairs and the bottom of the stairs. If you do not have them, ask someone to add them for you. What else can I do to help prevent falls? Wear shoes that: Do not have high heels. Have rubber bottoms. Are comfortable and fit you well. Are closed at the toe. Do not wear sandals. If you use a stepladder: Make sure that it is fully opened. Do not climb a closed stepladder. Make sure that both sides of the stepladder are locked into place. Ask someone to hold it for you, if possible. Clearly mark and make sure that you can see: Any grab bars or handrails. First and last steps. Where the edge of each step is. Use tools that help you move around (mobility aids) if they are needed. These include: Canes. Walkers. Scooters. Crutches. Turn on the lights when you go into a dark area. Replace any light bulbs as soon as they burn out. Set up your furniture so you have a clear path. Avoid moving your furniture around. If any of your floors are uneven, fix them. If there are any pets around you, be aware of where they are. Review your medicines with your doctor. Some medicines can make you feel dizzy. This can increase your chance of falling. Ask your doctor what other things that you can do to help prevent falls. This information is not intended to replace advice given to you by your health care provider. Make sure you discuss any questions you have with your health care provider. Document Released: 09/09/2009 Document Revised: 04/20/2016 Document Reviewed: 12/18/2014 Elsevier Interactive Patient Education  2017 [employer].

## 2023-01-02 NOTE — Progress Notes (Signed)
I connected with  [name] today via telehealth video enabled device and verified that I am speaking with the correct person using two identifiers.   Location: Patient: home  Provider: office  Persons participating in virtual visit: Andrea Hillock LPN  I discussed the limitations, risks, security and privacy concerns of performing an evaluation and management service by video and the availability of in person appointments. The patient expressed understanding and agreed to proceed.   Some vital signs may be absent or patient reported.     Subjective:   [name] is a 62 y.o. female who presents for Medicare Annual (Subsequent) preventive examination.  Review of Systems     Cardiac Risk Factors include: obesity (BMI >30kg/m2)     Objective:    Today's Vitals   01/02/23 1449 01/02/23 1450  Weight: 187 lb (84.8 kg)   Height: '5\' 5"'$  (1.651 m)   PainSc:  5    Body mass index is 31.12 kg/m.     01/02/2023    2:53 PM 01/30/2022    1:01 PM 10/14/2021    2:30 PM 03/31/2021    4:57 PM 06/21/2018    8:46 PM 07/13/2014   10:00 PM 06/18/2014   10:54 AM  Advanced Directives  Does Patient Have a Medical Advance Directive? No No No No No Yes Patient has advance directive, copy not in chart  Type of Advance Directive      Living will Living will  Does patient want to make changes to medical advance directive?      No - Patient declined   Copy of [place] in Chart?      No - copy requested Copy requested from family  Would patient like information on creating a medical advance directive?  No - Patient declined   No - Patient declined      Current Medications (verified) Outpatient Encounter Medications as of 01/02/2023  Medication Sig   albuterol (VENTOLIN HFA) 108 (90 Base) MCG/ACT inhaler INHALE TWO PUFFS BY MOUTH EVERY 6 HOURS AS NEEDED FOR WHEEZING OR SHORTNESS OF BREATH   aspirin 81 MG chewable tablet Chew 81 mg by mouth daily.    DULoxetine (CYMBALTA) 60 MG capsule TAKE ONE CAPSULE BY MOUTH DAILY   estradiol (ESTRACE) 2 MG tablet Take 2 mg by mouth daily.   etanercept (ENBREL SURECLICK) 50 MG/ML injection Enbrel SureClick 50 mg/mL (1 mL) subcutaneous pen injector   fluticasone (FLONASE) 50 MCG/ACT nasal spray Place 1 spray into both nostrils daily.   hydroxychloroquine (PLAQUENIL) 200 MG tablet Take 200 mg by mouth 2 (two) times daily.   losartan-hydrochlorothiazide (HYZAAR) 100-12.5 MG tablet Take 1 tablet by mouth daily.   Magnesium Oxide (MAG-CAPS PO) Take 215 mg by mouth.   pantoprazole (PROTONIX) 40 MG tablet 1 tablet   Polyethylene Glycol 3350 (MIRALAX PO) 1 capful   rizatriptan (MAXALT) 10 MG tablet TAKE 1 TABLET BY MOUTH AT ONSET OF HEADACHE. MAY REPEAT ONE TIME IN 2 HOURS IF NEEDED. NO MORE THAN 2 TABLETS PER DAY   topiramate (TOPAMAX) 100 MG tablet TAKE 1 TABLET BY MOUTH TWICE A DAY   VITAMIN D, CHOLECALCIFEROL, PO Take 1,200 Units by mouth daily.   zolpidem (AMBIEN) 10 MG tablet Take 10 mg by mouth at bedtime as needed for sleep.   ondansetron (ZOFRAN) 4 MG tablet Take 1 tablet (4 mg total) by mouth daily as needed for nausea or vomiting. (Patient not taking: Reported on 02/09/2022)   No  facility-administered encounter medications on file as of 01/02/2023.    Allergies (verified) Celecoxib, Leflunomide, Methotrexate, and Penicillins   History: Past Medical History:  Diagnosis Date   Anxiety    Decreased libido    on depo testosterone injections from GYN (Dr. Stann Mainland at [facility] 04/2015)   Depression    Hemorrhoids    History of colon polyps    TUBULAR ADENOMA   History of kidney stones    History of pancreatitis    2001   Hydronephrosis, right    IBS (irritable bowel syndrome)    Medullary sponge kidney    Migraine    Mild intermittent asthma    NO INHALER   Pleural fibrosis    MILD SUBPLEURAL FIBROSIS-- LAST CT 2011--  PER PULMOLOGIST NOTE DR SOOD 2011   Past Surgical History:  Procedure Laterality  Date   ABDOMINAL HYSTERECTOMY  11/27/1994   CESAREAN SECTION  X2   COLONOSCOPY W/ POLYPECTOMY  2004  &  01-16-2011   CYSTOSCOPY WITH RETROGRADE PYELOGRAM, URETEROSCOPY AND STENT PLACEMENT Right 06/18/2014   Procedure: CYSTOSCOPY WITH RETROGRADE PYELOGRAM,  STENT PLACEMENT;  Surgeon: Arvil Persons, MD;  Location: [facility];  Service: Urology;  Laterality: Right;   CYSTOSCOPY/RETROGRADE/URETEROSCOPY Right 07/13/2014   Procedure: CYSTOSCOPY/RETROGRADE/RIGHT URETEROSCOPY AND REMOVAL OF RIGHT STENT;  Surgeon: Jorja Loa, MD;  Location: [facility];  Service: Urology;  Laterality: Right;   CYSTOSCOPY/RETROGRADE/URETEROSCOPY/STONE EXTRACTION WITH BASKET Right 06/08/2014   Procedure: CYSTOSCOPY RIGHT RETROGRADE/URETEROSCOPY/STONE EXTRACTION WITH BASKET WITH STENT PLACEMENT;  Surgeon: Jorja Loa, MD;  Location: [facility];  Service: Urology;  Laterality: Right;   ERCP  06/27/2000   EXTRACORPOREAL SHOCK WAVE LITHOTRIPSY Right 12/24/2012   EXTRACORPOREAL SHOCK WAVE LITHOTRIPSY Right 01/30/2022   Procedure: EXTRACORPOREAL SHOCK WAVE LITHOTRIPSY (ESWL);  Surgeon: Ceasar Mons, MD;  Location: [facility];  Service: Urology;  Laterality: Right;   HOLMIUM LASER APPLICATION Right 29/52/8413   Procedure:  HOLMIUM LASER APPLICATION;  Surgeon: Jorja Loa, MD;  Location[facility];  Service: Urology;  Laterality: Right;   HOLMIUM LASER APPLICATION Right 24/40/1027   Procedure: HOLMIUM LASER APPLICATION;  Surgeon: Jorja Loa, MD;  Location: [facility];  Service: Urology;  Laterality: Right;   LAPAROSCOPIC CHOLECYSTECTOMY  [phone]   PARTIAL NEPHRECTOMY Bilateral LEFT  05-18-2006/   RIGHT 1998   LEFT (ANGIOMYOLIPOMA)   RIGHT (BENIGN TUMOR)   TOTAL ABDOMINAL [place]STERECT[place]Y  11/27/1994   [place]   TUBAL LIGATION  11/28/1991   Family History  Problem Relation Age of Onset   Cancer Father    Social History    Socioeconomic History   Marital status: Divorced    Spouse name: Not on file   Number of children: Not on file   Years of education: Not on file   Highest education level: Not on file  Occupational History   Not on file  Tobacco Use   Smoking status: Never   Smokeless tobacco: Never  Vaping Use   Vaping Use: Never used  Substance and Sexual Activity   Alcohol use: No   Drug use: No   Sexual activity: Yes  Other Topics Concern   Not on file  Social History Narrative   Not on file   Social Determinants of Health   Financial Resource Strain: Low Risk  (01/02/2023)   Overall Financial Resource Strain (CARDIA)    Difficulty of Paying Living Expenses: Not hard at all  Food Insecurity: No Food Insecurity (01/02/2023)  Hunger Vital Sign    Worried About Running Out of Food in the Last Year: Never true    Ran Out of Food in the Last Year: Never true  Transportation Needs: No Transportation Needs (01/02/2023)   PRAPARE - Hydrologist (Medical): No    Lack of Transportation (Non-Medical): No  Physical Activity: Insufficiently Active (01/02/2023)   Exercise Vital Sign    Days of Exercise per Week: 3 days    Minutes of Exercise per Session: 10 min  Stress: No Stress Concern Present (01/02/2023)   [place]    Feeling of Stress : Not at all  Social Connections: Not on file    Tobacco Counseling Counseling given: Not Answered   Clinical Intake:  Pre-visit preparation completed: Yes  Pain : 0-10 Pain Score: 5  Pain Type: Chronic pain Pain Location: Hand Pain Orientation: Left, Right Pain Descriptors / Indicators: Aching Pain Onset: More than a month ago Pain Frequency: Constant     Nutritional Status: BMI > 30  Obese Nutritional Risks: None Diabetes: No  How often do you need to have someone help you when you read instructions, pamphlets, or other written materials from your  doctor or pharmacy?: 1 - Never  Diabetic? no  Interpreter Needed?: No  Information entered by :: NAllen LPN   Activities of Daily Living    01/02/2023    2:54 PM 01/01/2023    4:57 PM  In your present state of health, do you have any difficulty performing the following activities:  Hearing? 0 0  Vision? 0 0  Difficulty concentrating or making decisions? 0 0  Walking or climbing stairs? 0   Dressing or bathing? 0 0  Doing errands, shopping? 0 0  Preparing Food and eating ? N N  Using the Toilet? N N  In the past six months, have you accidently leaked urine? N N  Do you have problems with loss of bowel control? N N  Managing your Medications? N N  Managing your Finances? N N  Housekeeping or managing your Housekeeping? N Y    Patient Care Team: Denita Lung, MD as PCP - General (Family Medicine)  Indicate any recent Medical Services you may have received from other than [facility] providers in the past year (date may be approximate).     Assessment:   This is a routine wellness examination for [facility].  Hearing/Vision screen Vision Screening - Comments:: Regular eye exams, [facility]  Dietary issues and exercise activities discussed: Current Exercise Habits: Home exercise routine, Type of exercise: walking, Time (Minutes): 10, Frequency (Times/Week): 3, Weekly Exercise (Minutes/Week): 30   Goals Addressed             This Visit's Progress    Patient Stated       01/02/2023, wantsto lose weight       Depression Screen    01/02/2023    2:54 PM 10/14/2021    2:32 PM  PHQ 2/9 Scores  PHQ - 2 Score 0 0  PHQ- 9 Score 3     Fall Risk    01/02/2023    2:53 PM 01/01/2023    4:57 PM 10/14/2021    2:31 PM 10/10/2021    5:01 PM  Fall Risk   Falls in the past year? 0 0 1 1  Comment   loses balance and trips   Number falls in past yr: 0  1 1  Injury with Fall? 0  0   Risk for fall due to : Medication side effect  Impaired balance/gait;Medication side effect    Follow up Falls prevention discussed;Education provided;Falls evaluation completed  Falls evaluation completed;Education provided;Falls prevention discussed     FALL RISK PREVENTION PERTAINING TO THE HOME:  Any stairs in or around the home? Yes  If so, are there any without handrails? No  Home free of loose throw rugs in walkways, pet beds, electrical cords, etc? Yes  Adequate lighting in your home to reduce risk of falls? Yes   ASSISTIVE DEVICES UTILIZED TO PREVENT FALLS:  Life alert? No  Use of a cane, walker or w/c? No  Grab bars in the bathroom? Yes  Shower chair or bench in shower? No  Elevated toilet seat or a handicapped toilet? Yes   TIMED UP AND GO:  Was the test performed? No .      Cognitive Function:        01/02/2023    2:55 PM 10/14/2021    2:33 PM  6CIT Screen  What Year? 0 points 0 points  What month? 0 points 0 points  What time? 0 points 0 points  Count back from 20 0 points 0 points  Months in reverse 0 points 0 points  Repeat phrase 2 points 4 points  Total Score 2 points 4 points    Immunizations Immunization History  Administered Date(s) Administered   Influenza Split 09/12/2011   Influenza,inj,Quad PF,6+ Mos 08/01/2016, 10/03/2018   Influenza-Unspecified 07/28/2018   Moderna Sars-Covid-2 Vaccination 06/04/2020, 10/15/2020   Pneumococcal Conjugate-13 10/03/2018   Pneumococcal Polysaccharide-23 06/01/2021   Tdap 04/08/2018   Zoster Recombinat (Shingrix) 08/07/2022    TDAP status: Up to date  Flu Vaccine status: Declined, Education has been provided regarding the importance of this vaccine but patient still declined. Advised may receive this vaccine at local pharmacy or Health Dept. Aware to provide a copy of the vaccination record if obtained from local pharmacy or Health Dept. Verbalized acceptance and understanding.  Pneumococcal vaccine status: Up to date  Covid-19 vaccine status: Completed vaccines  Qualifies for Shingles  Vaccine? Yes   Zostavax completed No   Shingrix Completed?: needs second dose  Screening Tests Health Maintenance  Topic Date Due   HIV Screening  Never done   COVID-19 Vaccine (3 - Moderna risk series) 11/12/2020   Zoster Vaccines- Shingrix (2 of 2) 10/02/2022   Medicare Annual Wellness (AWV)  10/14/2022   INFLUENZA VACCINE  02/25/2023 (Originally 06/27/2022)   MAMMOGRAM  03/02/2024   COLONOSCOPY (Pts 45-34yr Insurance coverage will need to be confirmed)  04/07/2027   DTaP/Tdap/Td (2 - Td or Tdap) 04/08/2028   Hepatitis C Screening  Completed   HPV VACCINES  Aged Out    Health Maintenance  Health Maintenance Due  Topic Date Due   HIV Screening  Never done   COVID-19 Vaccine (3 - Moderna risk series) 11/12/2020   Zoster Vaccines- Shingrix (2 of 2) 10/02/2022   Medicare Annual Wellness (AWV)  10/14/2022    Colorectal cancer screening: Type of screening: Colonoscopy. Completed 04/06/2017. Repeat every 7 years  Mammogram status: Completed 03/02/2022. Repeat every year  Bone Density status: n/a  Lung Cancer Screening: (Low Dose CT Chest recommended if Age 457-80years, 30 pack-year currently smoking OR have quit w/in 15years.) does not qualify.   Lung Cancer Screening Referral: no  Additional Screening:  Hepatitis C Screening: does qualify; Completed 04/29/2010  Vision Screening: Recommended annual ophthalmology exams for early  detection of glaucoma and other disorders of the eye. Is the patient up to date with their annual eye exam?  Yes  Who is the provider or what is the name of the office in which the patient attends annual eye exams? [facility] If pt is not established with a provider, would they like to be referred to a provider to establish care? No .   Dental Screening: Recommended annual dental exams for proper oral hygiene  Community Resource Referral / Chronic Care Management: CRR required this visit?  No   CCM required this visit?  No      Plan:     I  have personally reviewed and noted the following in the patient's chart:   Medical and social history Use of alcohol, tobacco or illicit drugs  Current medications and supplements including opioid prescriptions. Patient is not currently taking opioid prescriptions. Functional ability and status Nutritional status Physical activity Advanced directives List of other physicians Hospitalizations, surgeries, and ER visits in previous 12 months Vitals Screenings to include cognitive, depression, and falls Referrals and appointments  In addition, I have reviewed and discussed with patient certain preventive protocols, quality metrics, and best practice recommendations. A written personalized care plan for preventive services as well as general preventive health recommendations were provided to patient.     Kellie Simmering, LPN   03/30/6269   Nurse Notes: none  Due to this being a virtual visit, the after visit summary with patients personalized plan was offered to patient via mail or my-chart.  Patient would like to access on my-chart

## 2023-01-04 DIAGNOSIS — L988 Other specified disorders of the skin and subcutaneous tissue: Secondary | ICD-10-CM | POA: Diagnosis not present

## 2023-01-04 DIAGNOSIS — D485 Neoplasm of uncertain behavior of skin: Secondary | ICD-10-CM | POA: Diagnosis not present

## 2023-01-04 DIAGNOSIS — D0339 Melanoma in situ of other parts of face: Secondary | ICD-10-CM | POA: Diagnosis not present

## 2023-01-24 DIAGNOSIS — D0339 Melanoma in situ of other parts of face: Secondary | ICD-10-CM | POA: Diagnosis not present

## 2023-01-30 ENCOUNTER — Encounter: Payer: Self-pay | Admitting: Family Medicine

## 2023-01-30 ENCOUNTER — Ambulatory Visit (INDEPENDENT_AMBULATORY_CARE_PROVIDER_SITE_OTHER): Payer: Medicare Other | Admitting: Family Medicine

## 2023-01-30 VITALS — BP 118/84 | HR 76 | Ht 65.0 in | Wt 197.6 lb

## 2023-01-30 DIAGNOSIS — R42 Dizziness and giddiness: Secondary | ICD-10-CM | POA: Diagnosis not present

## 2023-01-30 DIAGNOSIS — Z6830 Body mass index (BMI) 30.0-30.9, adult: Secondary | ICD-10-CM

## 2023-01-30 DIAGNOSIS — I10 Essential (primary) hypertension: Secondary | ICD-10-CM | POA: Diagnosis not present

## 2023-01-30 DIAGNOSIS — E6609 Other obesity due to excess calories: Secondary | ICD-10-CM

## 2023-01-30 DIAGNOSIS — C439 Malignant melanoma of skin, unspecified: Secondary | ICD-10-CM

## 2023-01-30 NOTE — Progress Notes (Signed)
   Subjective:    Patient ID: [name], female    DOB: [birth year], 62 y.o.   MRN: [id number]  HPI She is here for a recheck.  She was switched to Hyzaar for her blood pressure and has been checking the pressure quite often.  She is concerned about elevated blood pressure causing dizziness and heart rate increases.  She also was recently diagnosed with melanoma and is in the process of having surgery on that.  She seems to be handling this fairly well.  She does have a trip on a cruise line and is concerned about seasickness.  At the end of the encounter that she then mentioned getting put on phentermine which she apparently has been on in the past.   Review of Systems     Objective:   Physical Exam Alert and in no distress.  Blood pressure is recorded.       Assessment & Plan:  Essential hypertension  Melanoma of skin (HCC)  Class 1 obesity due to excess calories with body mass index (BMI) of 30.0 to 30.9 in adult, unspecified whether serious comorbidity present  Dizzy spells I discussed blood pressure and treatment.  Explained that elevated blood pressure is asymptomatic until it is too late causing the possibility of stroke, heart failure and kidney failure.  I explained that dizzy spells and heart rate are not related to her blood pressure.  I then discussed how to check the heart rate and let me know if she does have an elevated rate and rhythm.  I then explained blood pressure and to check it less often stating once every month or so is okay.  Explained she needs to check it in the resting, sitting position with arm at heart level. Then I briefly discussed the melanoma with her in terms of excision and repair of this.  She had a very bad experience with the plastic surgeon and plans to get this done by someone else. At the end of the encounter she mentions something about going back on phentermine and I explained that I thought it be best to take care of the melanoma and  that I will refer her to medical weight loss and wellness to see if this will help with her weight problem.  Over 30 minutes spent discussing all these issues with her.

## 2023-02-28 DIAGNOSIS — L989 Disorder of the skin and subcutaneous tissue, unspecified: Secondary | ICD-10-CM | POA: Diagnosis not present

## 2023-02-28 DIAGNOSIS — L905 Scar conditions and fibrosis of skin: Secondary | ICD-10-CM | POA: Diagnosis not present

## 2023-02-28 DIAGNOSIS — D0339 Melanoma in situ of other parts of face: Secondary | ICD-10-CM | POA: Diagnosis not present

## 2023-03-01 ENCOUNTER — Other Ambulatory Visit: Payer: Self-pay | Admitting: Family Medicine

## 2023-04-04 DIAGNOSIS — Z1231 Encounter for screening mammogram for malignant neoplasm of breast: Secondary | ICD-10-CM | POA: Diagnosis not present

## 2023-04-04 LAB — HM MAMMOGRAPHY

## 2023-04-26 DIAGNOSIS — D225 Melanocytic nevi of trunk: Secondary | ICD-10-CM | POA: Diagnosis not present

## 2023-04-26 DIAGNOSIS — Z85820 Personal history of malignant melanoma of skin: Secondary | ICD-10-CM | POA: Diagnosis not present

## 2023-04-26 DIAGNOSIS — L821 Other seborrheic keratosis: Secondary | ICD-10-CM | POA: Diagnosis not present

## 2023-04-26 DIAGNOSIS — L820 Inflamed seborrheic keratosis: Secondary | ICD-10-CM | POA: Diagnosis not present

## 2023-04-26 DIAGNOSIS — L814 Other melanin hyperpigmentation: Secondary | ICD-10-CM | POA: Diagnosis not present

## 2023-04-26 DIAGNOSIS — Z08 Encounter for follow-up examination after completed treatment for malignant neoplasm: Secondary | ICD-10-CM | POA: Diagnosis not present

## 2023-04-26 DIAGNOSIS — Z1283 Encounter for screening for malignant neoplasm of skin: Secondary | ICD-10-CM | POA: Diagnosis not present

## 2023-04-26 DIAGNOSIS — D485 Neoplasm of uncertain behavior of skin: Secondary | ICD-10-CM | POA: Diagnosis not present

## 2023-04-30 ENCOUNTER — Other Ambulatory Visit: Payer: Self-pay | Admitting: Family Medicine

## 2023-05-17 DIAGNOSIS — R1319 Other dysphagia: Secondary | ICD-10-CM | POA: Diagnosis not present

## 2023-05-17 DIAGNOSIS — R197 Diarrhea, unspecified: Secondary | ICD-10-CM | POA: Diagnosis not present

## 2023-05-17 DIAGNOSIS — Z86010 Personal history of colonic polyps: Secondary | ICD-10-CM | POA: Diagnosis not present

## 2023-05-21 DIAGNOSIS — L989 Disorder of the skin and subcutaneous tissue, unspecified: Secondary | ICD-10-CM | POA: Diagnosis not present

## 2023-05-21 DIAGNOSIS — D485 Neoplasm of uncertain behavior of skin: Secondary | ICD-10-CM | POA: Diagnosis not present

## 2023-05-21 DIAGNOSIS — L820 Inflamed seborrheic keratosis: Secondary | ICD-10-CM | POA: Diagnosis not present

## 2023-06-03 ENCOUNTER — Other Ambulatory Visit: Payer: Self-pay | Admitting: Family Medicine

## 2023-06-04 NOTE — Telephone Encounter (Signed)
Refill request last apt 01/30/23 

## 2023-06-05 DIAGNOSIS — Z79899 Other long term (current) drug therapy: Secondary | ICD-10-CM | POA: Diagnosis not present

## 2023-06-05 DIAGNOSIS — G894 Chronic pain syndrome: Secondary | ICD-10-CM | POA: Diagnosis not present

## 2023-06-05 DIAGNOSIS — M1991 Primary osteoarthritis, unspecified site: Secondary | ICD-10-CM | POA: Diagnosis not present

## 2023-06-05 DIAGNOSIS — M0579 Rheumatoid arthritis with rheumatoid factor of multiple sites without organ or systems involvement: Secondary | ICD-10-CM | POA: Diagnosis not present

## 2023-06-05 DIAGNOSIS — C4490 Unspecified malignant neoplasm of skin, unspecified: Secondary | ICD-10-CM | POA: Diagnosis not present

## 2023-06-05 DIAGNOSIS — Z0289 Encounter for other administrative examinations: Secondary | ICD-10-CM

## 2023-06-05 DIAGNOSIS — R5382 Chronic fatigue, unspecified: Secondary | ICD-10-CM | POA: Diagnosis not present

## 2023-06-06 ENCOUNTER — Encounter (INDEPENDENT_AMBULATORY_CARE_PROVIDER_SITE_OTHER): Payer: Medicare Other | Admitting: Family Medicine

## 2023-06-13 DIAGNOSIS — D485 Neoplasm of uncertain behavior of skin: Secondary | ICD-10-CM | POA: Diagnosis not present

## 2023-06-13 DIAGNOSIS — L988 Other specified disorders of the skin and subcutaneous tissue: Secondary | ICD-10-CM | POA: Diagnosis not present

## 2023-06-29 DIAGNOSIS — Z86010 Personal history of colonic polyps: Secondary | ICD-10-CM | POA: Diagnosis not present

## 2023-06-29 DIAGNOSIS — K222 Esophageal obstruction: Secondary | ICD-10-CM | POA: Diagnosis not present

## 2023-06-29 DIAGNOSIS — K449 Diaphragmatic hernia without obstruction or gangrene: Secondary | ICD-10-CM | POA: Diagnosis not present

## 2023-06-29 DIAGNOSIS — D128 Benign neoplasm of rectum: Secondary | ICD-10-CM | POA: Diagnosis not present

## 2023-06-29 DIAGNOSIS — K5730 Diverticulosis of large intestine without perforation or abscess without bleeding: Secondary | ICD-10-CM | POA: Diagnosis not present

## 2023-06-29 DIAGNOSIS — Z09 Encounter for follow-up examination after completed treatment for conditions other than malignant neoplasm: Secondary | ICD-10-CM | POA: Diagnosis not present

## 2023-06-29 DIAGNOSIS — K219 Gastro-esophageal reflux disease without esophagitis: Secondary | ICD-10-CM | POA: Diagnosis not present

## 2023-06-29 HISTORY — PX: POLYPECTOMY: SHX149

## 2023-06-29 LAB — HM COLONOSCOPY

## 2023-07-04 ENCOUNTER — Ambulatory Visit (INDEPENDENT_AMBULATORY_CARE_PROVIDER_SITE_OTHER): Payer: Medicare Other | Admitting: Family Medicine

## 2023-07-04 ENCOUNTER — Encounter (INDEPENDENT_AMBULATORY_CARE_PROVIDER_SITE_OTHER): Payer: Self-pay | Admitting: Family Medicine

## 2023-07-04 VITALS — BP 130/87 | HR 67 | Temp 98.3°F | Ht 65.5 in | Wt 188.0 lb

## 2023-07-04 DIAGNOSIS — M05741 Rheumatoid arthritis with rheumatoid factor of right hand without organ or systems involvement: Secondary | ICD-10-CM

## 2023-07-04 DIAGNOSIS — Z1331 Encounter for screening for depression: Secondary | ICD-10-CM

## 2023-07-04 DIAGNOSIS — F331 Major depressive disorder, recurrent, moderate: Secondary | ICD-10-CM

## 2023-07-04 DIAGNOSIS — R5383 Other fatigue: Secondary | ICD-10-CM | POA: Diagnosis not present

## 2023-07-04 DIAGNOSIS — Z6830 Body mass index (BMI) 30.0-30.9, adult: Secondary | ICD-10-CM

## 2023-07-04 DIAGNOSIS — E559 Vitamin D deficiency, unspecified: Secondary | ICD-10-CM | POA: Diagnosis not present

## 2023-07-04 DIAGNOSIS — R0602 Shortness of breath: Secondary | ICD-10-CM | POA: Diagnosis not present

## 2023-07-04 DIAGNOSIS — M05742 Rheumatoid arthritis with rheumatoid factor of left hand without organ or systems involvement: Secondary | ICD-10-CM | POA: Diagnosis not present

## 2023-07-04 DIAGNOSIS — I10 Essential (primary) hypertension: Secondary | ICD-10-CM | POA: Diagnosis not present

## 2023-07-04 DIAGNOSIS — E669 Obesity, unspecified: Secondary | ICD-10-CM

## 2023-07-04 NOTE — Progress Notes (Signed)
Carlye Grippe, D.O.  ABFM, ABOM Specializing in Clinical Bariatric Medicine Office located at: [address]  [place], [place]  [address]     Bariatric Medicine Visit  Dear Ronnald Nian, MD or Ronnald Nian, MD,   Thank you for referring [name] to our clinic today for evaluation.  We performed a consultation to discuss her options for treatment and educate the patient on her disease state.  The following note includes my evaluation and treatment recommendations.   Please do not hesitate to reach out to me directly if you have any further concerns.   Assessment and Plan:  Review Labs.  Orders Placed This Encounter  Procedures   CBC with Differential/Platelet   VITAMIN D 25 Hydroxy (Vit-D Deficiency, Fractures)   Comprehensive metabolic panel   Folate   Hemoglobin A1c   Insulin, random   Lipid Panel With LDL/HDL Ratio   T4, free   TSH   Vitamin B12   EKG 12-Lead   Medications Discontinued During This Encounter  Medication Reason   estradiol (ESTRACE) 2 MG tablet    Polyethylene Glycol 3350 (MIRALAX PO)      1) Fatigue Assessment: Condition is new. [name] does feel that her weight is causing her energy to be lower than it should be. Fatigue may be related to obesity, depression or many other causes. she does not appear to have any red flag symptoms and this appears to most likely be related to her current lifestyle habits and dietary intake.  Plan:  Labs will be ordered and reviewed with her at their next office visit in two weeks. Epworth sleepiness scale score appears to be within normal limits.  Her ESS score is 7.   [name] denies daytime somnolence and admits to waking up still tired. Patient has a history of symptoms of morning headache. [name] generally gets  4-8  hours of sleep per night, and states that she has difficulty falling asleep. Snoring is present. Apneic episodes are not present.  ECG: Performed and reviewed/ interpreted  independently.  Normal sinus rhythm, rate 63bpm; reassuring without any acute abnormalities, will continue to monitor for symptoms  Modified PHQ-9 Depression Screen: Her Food and Mood (modified PHQ-9) score was 8. In the meanwhile, [name] will focus on self care including making healthy food choices by following their meal plan, improving sleep quality and focusing on stress reduction.  Once we are assured she is on an appropriate meal plan, we will start discussing exercise to increase cardiovascular fitness levels.    2) Shortness of breath on exertion Assessment: Condition is new. [name] does feel that she gets out of breath more easily than she used to when she exercises and seems to be worsening over time with weight gain.  This has gotten worse recently. [name] denies shortness of breath at rest or orthopnea. [name]'s shortness of breath appears to be obesity related and exercise induced, as they do not appear to have any "red flag" symptoms/ concerns today.  Also, this condition appears to be related to a state of poor cardiovascular conditioning   Plan:  Obtain labs today and will be reviewed with her at their next office visit in two weeks. Indirect Calorimeter completed today to help guide our dietary regimen. It shows a VO2 of 196 and a REE of 1354.  Her calculated basal metabolic rate is 6045 thus her measured basal metabolic rate is worse than expected. Patient agreed to work on weight loss at this time.  As [name] progresses through our weight loss program, we will gradually increase exercise as tolerated to treat her current condition.   If [name] follows our recommendations and loses 5-10% of their weight without improvement of her shortness of breath or if at any time, symptoms become more concerning, they agree to urgently follow up with their PCP/ specialist for further consideration/ evaluation.   [name] verbalizes agreement with this plan.    Essential  hypertension Assessment: Condition is slightly At goal. This is controlled with Hyzaar and she has only been on it for about a year. She did not take her medicine this morning before her appointment today.  Last 3 blood pressure readings in our office are as follows: BP Readings from Last 3 Encounters:  07/04/23 130/87  01/30/23 118/84  10/25/22 130/82   Plan: - Continue Hyzaar 100-12.5mg  daily.   - Lifestyle changes such as following our low salt, heart healthy meal plan and engaging in a regular exercise program discussed. Avoid buying foods that are: processed, frozen, or prepackaged to avoid excess salt.  - Ambulatory blood pressure monitoring encouraged.   - We will continue to monitor symptoms closely alongside PCP/ specialists as they relate to the her weight loss journey.   Rheumatoid arthritis involving both hands with positive rheumatoid factor (HCC) Assessment: Condition is Not optimized. She is currently taking Enbrel 50mg /mL injections and methocarbamol 500mg  to help alleviate her rheumatoid arthritis pain.   Plan: Continue to take Enbrel and methocarbamol as directed by her PCP.    Moderate episode of recurrent major depressive disorder (HCC)- emotional eating Assessment: Condition is Not optimized. Denies any SI/HI. Mood is stable. She is currently on Cymbalta and tolerates this well.  Cravings and hunger are well not controlled. She tends to eat when stressed, bored, and angry. She does tend to overeat and feels some guilt when doing so.   Plan: - Continue Cymbalta 60mg  as directed by Dr. Susann Givens.   - Patient was referred to Dr. Dewaine Conger, our Bariatric Psychologist, for evaluation due to her elevated PHQ-9 score and significant struggles with emotional eating. She declines to see Dr. Dewaine Conger at this time.   - Behavior modification techniques were discussed today to help deal with emotional/ non-hunger eating behaviors including but not limited to self care activities like  adequate sleep (7-9 hrs/nite).  - Discussed cognitive distortions, coping thoughts, and how to change our thoughts/ self talk regarding foods/ eating patterns.  - Importance of following up with PCP and others was stressed.  - Reminded patient of the importance of following their prudent nutrition plan and how food can affect mood as well to support emotional wellbeing.   - We will continue to monitor closely.   Vitamin D deficiency Assessment: Condition is Not optimized.. Her vitamin D level is well low below the recommended range at 75. Pt continues to take OTC oral vitamin D supplements and tolerates this well without difficulty. She denies any adverse side effects.  Lab Results  Component Value Date   VD25OH 75 10/16/2011   Plan:- Continue with OTC Cholecalciferol 1200 lU twice daily.  - I reviewed possible symptoms of low Vitamin D:  low energy, depressed mood, muscle aches, joint aches, osteoporosis etc. with patient  - weight loss will likely improve availability of vitamin D, thus encouraged Jamie to continue with meal plan and their weight loss efforts to further improve this condition.  Thus, we will need to monitor levels regularly (every 3-4 mo on average) to keep levels  within normal limits and prevent over supplementation.   TREATMENT PLAN FOR OBESITY: Obesity (BMI 30.0-34.9) - starting BMI 07/04/23-30.8 Assessment: Condition is Not at goal.. Biometric data collected today, was reviewed with patient.  Muscle mass is 99.2lb. Fat mass is 83.8lb.Total body water is 74.6lb.   Plan:  [name] is currently in the action stage of change. As such, her goal is to continue weight management plan. [name] will work on healthier eating habits and try their best to follow the category 1 meal plan best they can.   Behavioral Intervention Additional resources provided today: category 1 meal plan information Evidence-based interventions for health behavior change were utilized  today including the discussion of self monitoring techniques, problem-solving barriers and SMART goal setting techniques.   Regarding patient's less desirable eating habits and patterns, we employed the technique of small changes.  Pt will specifically work on: Beginning her prescribed meal plan for next visit.    Recommended Physical Activity Goals Jackquelyn has been advised to gradually work up to 150 minutes of moderate intensity aerobic activity a week and strengthening exercises 2-3 times per week for cardiovascular health, weight loss maintenance and preservation of muscle mass.  She has agreed to continue their current level of activity  FOLLOW UP: Follow up in 2 weeks. She was informed of the importance of frequent follow up visits to maximize her success with intensive lifestyle modifications for her multiple health conditions.  [name] is aware that we will review all of her lab results at our next visit.  She is aware that if anything is critical/ life threatening with the results, we will be contacting her via MyChart prior to the office visit to discuss management.  [name] is aware that we will review all of her lab results at our next visit together in person.  She is aware that if anything is critical/ life threatening with the results, we will be contacting her via MyChart or by my CMA will be calling them prior to the office visit to discuss acute management.     Chief Complaint:   OBESITY [name] (MR# [id number]) is a 62 y.o. female who presents for evaluation and treatment of obesity and related comorbidities. Current BMI is Body mass index is 30.81 kg/m. Hadas has been struggling with her weight for many years and has been unsuccessful in either losing weight, maintaining weight loss, or reaching her healthy weight goal.  [name] is currently in the action stage of change and ready to dedicate time achieving and maintaining a  healthier weight. Jalise is interested in becoming our patient and working on intensive lifestyle modifications including (but not limited to) diet and exercise for weight loss.  [name] is retired. Patient is single and has 2 children.  [name]'s habits were reviewed today and are as follows: her desired weight loss is to be 140lbs and doesn't have a specified timeframe. She walks about twice a week but that is limited due to fibromyalgia and rheumatoid arthritis. Pt wants to loose weight to decrease the number of medications she takes, decrease joint/muscle pain, have more energy, enjoy life and playing with her kids more. She hasn't tried a specific diet but has used phentermine in the past and went from 165 to 124. She tends to eat out once a week and does like to cook but the cost can be a obstacle at times. She tends to crave [place] and pasta and snacks on  salty sweets. She experiences most of her cravings at night and tends to skip breakfast and lunch daily. She endorses ravings sweets more after her rheumatoid arthritis diagnosis. Pt tends to drink soda and lemonade daily and feels as her worst habit is skipping meals. However, she does not like most greens.    Subjective:   This is the patient's first visit at [facility].  The patient's NEW PATIENT PACKET that they filled out prior to today's office visit was reviewed at length and information from that paperwork was included within the following office visit note.    Included in the packet: current and past health history, medications, allergies, ROS, gynecologic history (women only), surgical history, family history, social history, weight history, weight loss surgery history (for those that have had weight loss surgery), nutritional evaluation, mood and food questionnaire along with a depression screening (PHQ9) on all patients, an Epworth questionnaire, sleep habits questionnaire, patient life and  health improvement goals questionnaire. These will all be scanned into the patient's chart under the "media" tab.   Review of Systems: Please refer to new patient packet scanned into media. Pertinent positives were addressed with patient today.  Reviewed by clinician on day of visit: allergies, medications, problem list, medical history, surgical history, family history, social history, and previous encounter notes.  During the visit, I independently reviewed the patient's EKG, bioimpedance scale results, and indirect calorimeter results. I used this information to tailor a meal plan for the patient that will help [name] to lose weight and will improve her obesity-related conditions going forward.  I performed a medically necessary appropriate examination and/or evaluation. I discussed the assessment and treatment plan with the patient. The patient was provided an opportunity to ask questions and all were answered. The patient agreed with the plan and demonstrated an understanding of the instructions. Labs were ordered today (unless patient declined them) and will be reviewed with the patient at our next visit unless more critical results need to be addressed immediately. Clinical information was updated and documented in the EMR.   Objective:   PHYSICAL EXAM: Blood pressure 130/87, pulse 67, temperature 98.3 F (36.8 C), height 5' 5.5" (1.664 m), weight 188 lb (85.3 kg), SpO2 97%. Body mass index is 30.81 kg/m. General: Well Developed, well nourished, and in no acute distress.  HEENT: Normocephalic, atraumatic Skin: Warm and dry, cap RF less 2 sec, good turgor Chest:  Normal excursion, shape, no gross abn Respiratory: speaking in full sentences, no conversational dyspnea NeuroM-Sk: Ambulates w/o assistance, moves * 4 Psych: A and O *3, insight good, mood-full  Anthropometric Measurements Height: 5' 5.5" (1.664 m) Weight: 188 lb (85.3 kg) BMI (Calculated): 30.8 Weight at Last  Visit: na Weight Lost Since Last Visit: na Weight Gained Since Last Visit: na Starting Weight: 188lb Total Weight Loss (lbs): 0 lb (0 kg) Peak Weight: 188lb Waist Measurement : 41 inches   Body Composition  Body Fat %: 44.5 % Fat Mass (lbs): 83.8 lbs Muscle Mass (lbs): 99.2 lbs Total Body Water (lbs): 74.6 lbs Visceral Fat Rating : 11   Other Clinical Data RMR: 1354 Fasting: yes Labs: yes Today's Visit #: 1 Starting Date: 07/04/23 Comments: first visit    DIAGNOSTIC DATA REVIEWED:  BMET    Component Value Date/Time   NA 141 03/02/2022 0000   K 3.6 03/02/2022 0000   CL 101 03/02/2022 0000   CO2 22 03/02/2022 0000   GLUCOSE 83 01/05/2020 1447   GLUCOSE 101 (  H) 06/21/2018 2055   BUN 21 03/02/2022 0000   CREATININE 1.2 (A) 03/02/2022 0000   CREATININE 1.05 (H) 01/05/2020 1447   CREATININE 0.90 07/31/2017 1443   CALCIUM 9.9 03/02/2022 0000   GFRNONAA 59 (L) 01/05/2020 1447   GFRAA 68 01/05/2020 1447   Lab Results  Component Value Date   HGBA1C 5.1 03/02/2022   No results found for: "INSULIN" Lab Results  Component Value Date   TSH 0.86 03/02/2022   CBC    Component Value Date/Time   WBC 7.8 03/02/2022 0000   WBC 7.5 06/21/2018 2055   RBC 4.82 03/02/2022 0000   HGB 14.1 01/05/2020 1447   HCT 40.6 01/05/2020 1447   PLT 180 01/05/2020 1447   MCV 91 01/05/2020 1447   MCH 31.5 01/05/2020 1447   MCH 29.8 06/21/2018 2055   MCHC 34.7 01/05/2020 1447   MCHC 32.0 06/21/2018 2055   RDW 11.9 01/05/2020 1447   Iron Studies No results found for: "IRON", "TIBC", "FERRITIN", "IRONPCTSAT" Lipid Panel     Component Value Date/Time   CHOL 212 (A) 03/02/2022 0000   CHOL 195 01/05/2020 1447   TRIG 118 03/02/2022 0000   HDL 70 03/02/2022 0000   HDL 65 01/05/2020 1447   CHOLHDL 3.0 01/05/2020 1447   CHOLHDL 2.5 03/13/2016 0001   VLDL 22 03/13/2016 0001   LDLCALC 121 03/02/2022 0000   LDLCALC 115 (H) 01/05/2020 1447   Hepatic Function Panel     Component  Value Date/Time   PROT 6.4 01/05/2020 1447   ALBUMIN 4.9 03/02/2022 0000   ALBUMIN 4.1 01/05/2020 1447   AST 17 03/02/2022 0000   ALT 11 03/02/2022 0000   ALKPHOS 92 03/02/2022 0000   BILITOT 0.3 01/05/2020 1447   BILIDIR 0.2 04/29/2010 0956      Component Value Date/Time   TSH 0.86 03/02/2022 0000   TSH 1.150 01/05/2020 1447   Nutritional Lab Results  Component Value Date   VD25OH 75 10/16/2011    Attestation Statements:   I, Engineer, water, acting as a Stage manager for [employer], DO., have compiled all relevant documentation for today's office visit on behalf of Thomasene Lot, DO, while in the presence of [employer], DO.  Time spent on visit including pre-visit chart review and post-visit care was estimated to be 43 minutes. Over 50% of the time was spent in direct face to face counseling and coordination of care.  I have reviewed the above documentation for accuracy and completeness, and I agree with the above. Carlye Grippe, D.O.  The 21st Century Cures Act was signed into law in 2016 which includes the topic of electronic health records.  This provides immediate access to information in MyChart.  This includes consultation notes, operative notes, office notes, lab results and pathology reports.  If you have any questions about what you read please let us know at your next visit so we can discuss your concerns and take corrective action if need be.  We are right here with you.

## 2023-07-18 ENCOUNTER — Ambulatory Visit (INDEPENDENT_AMBULATORY_CARE_PROVIDER_SITE_OTHER): Payer: Medicare Other | Admitting: Family Medicine

## 2023-07-18 ENCOUNTER — Encounter (INDEPENDENT_AMBULATORY_CARE_PROVIDER_SITE_OTHER): Payer: Self-pay | Admitting: Family Medicine

## 2023-07-18 VITALS — BP 116/88 | HR 76 | Temp 98.3°F | Ht 65.5 in | Wt 187.0 lb

## 2023-07-18 DIAGNOSIS — E559 Vitamin D deficiency, unspecified: Secondary | ICD-10-CM | POA: Diagnosis not present

## 2023-07-18 DIAGNOSIS — F331 Major depressive disorder, recurrent, moderate: Secondary | ICD-10-CM | POA: Diagnosis not present

## 2023-07-18 DIAGNOSIS — E7849 Other hyperlipidemia: Secondary | ICD-10-CM

## 2023-07-18 DIAGNOSIS — Z6830 Body mass index (BMI) 30.0-30.9, adult: Secondary | ICD-10-CM

## 2023-07-18 DIAGNOSIS — N1831 Chronic kidney disease, stage 3a: Secondary | ICD-10-CM | POA: Diagnosis not present

## 2023-07-18 DIAGNOSIS — E669 Obesity, unspecified: Secondary | ICD-10-CM

## 2023-07-18 DIAGNOSIS — I10 Essential (primary) hypertension: Secondary | ICD-10-CM

## 2023-07-18 MED ORDER — VITAMIN D (ERGOCALCIFEROL) 1.25 MG (50000 UNIT) PO CAPS: 50000.0000 [IU] | ORAL_CAPSULE | ORAL | 0 refills | Status: DC

## 2023-07-18 NOTE — Patient Instructions (Signed)
The 10-year ASCVD risk score (Arnett DK, et al., 2019) is: 3.8%   Values used to calculate the score:     Age: 62 years     Sex: Female     Is Non-Hispanic African American: No     Diabetic: No     Tobacco smoker: No     Systolic Blood Pressure: 116 mmHg     Is BP treated: Yes     HDL Cholesterol: 65 mg/dL     Total Cholesterol: 208 mg/dL

## 2023-07-18 NOTE — Progress Notes (Signed)
[name], D.O.  ABFM, ABOM Clinical Bariatric Medicine Physician  Office located at: [address] [place], [place]  [address]     Assessment and Plan:   Medications Discontinued During This Encounter  Medication Reason   VITAMIN D, CHOLECALCIFEROL, PO     Meds ordered this encounter  Medications   Vitamin D, Ergocalciferol, (DRISDOL) 1.25 MG (50000 UNIT) CAPS capsule    Sig: Take 1 capsule (50,000 Units total) by mouth every 7 (seven) days.    Dispense:  5 capsule    Refill:  0    Moderate episode of recurrent major depressive disorder (HCC)- emotional eating Assessment: Condition is Not optimized.. Denies any SI/HI. Mood is stable. Cravings and hunger are not well controlled. She feels as she eats off meal plan when bored or stressed. She continues Cymbalta 60mg . She feels as exercising/walking helps improve her moods  Plan: - Continue Cymbalta at current dose.  - She is to not eat in front of an computer, tv, phone, any device.   - Reminded patient of the importance of following their prudent nutrition plan and how food can affect mood as well to support emotional wellbeing.    Essential hypertension Assessment: Condition is Controlled. Her last 3 readings have been well controlled. She has been compliant and tolerant with Hyzaar 100-12.5mg  daily. She denies any adverse side effects.  Last 3 blood pressure readings in our office are as follows: BP Readings from Last 3 Encounters:  07/18/23 116/88  07/04/23 130/87  01/30/23 118/84   The 10-year ASCVD risk score (Arnett DK, et al., 2019) is: 3.8%  Lab Results  Component Value Date   CREATININE 1.16 (H) 07/04/2023   Plan: - Continue with Hyzaar at current dose.   - [name] BP is 116/88 at goal today.   - Ambulatory blood pressure monitoring encouraged.  Reminded patient that if they ever feel poorly in any way, to check their blood pressure and pulse as well.  - We will continue to monitor  symptoms closely alongside PCP/ specialists.    Vitamin D deficiency Assessment: Condition is Not at goal.. Her vitamin D levels have dropped from 75 on 10/16/2011 to 36.8 on 07/04/2023. She continues OTC Cholecalciferol supplements. She tolerates this well and denies any adverse side effects. Her B-12 is not at goal and she does not take any B12 supplements.  Lab Results  Component Value Date   VD25OH 36.8 07/04/2023   VD25OH 75 10/16/2011   Lab Results  Component Value Date   VITAMINB12 480 07/04/2023   Plan: - Stop OTC vitamin D and begin Ergocalciferol 50K IU weekly.   - Pt will continue with consuming vitamin D and B-12 rich foods. We will monitor this.   - I reviewed possible symptoms of low Vitamin D:  decreased bone density, muscle aches, joint aches, etc. with patient  - ideal vitamin D and B-12 levels reviewed with patient    - We will continue to monitor levels regularly (every 3-4 mo on average) to keep levels within normal limits and prevent over supplementation.  - Labs were reviewed with patient today and education provided on them. We discussed how the foods patient eats may influence these laboratory findings.  All of the patient's questions about them were answered    Chronic kidney disease, stage 3a (HCC) Assessment: Condition is Not at goal.. She has a history of recurrent kidney stones and has previously had surgery on her kidneys. She see's her nephrologist  regularly. Her kidney function tests are normal but her creatinine level is elevated as of 07/04/2023. She notes drinking at least 3 bottles a day. She occasionally takes tylenol.  Lab Results  Component Value Date   CREATININE 1.16 (H) 07/04/2023   BUN 11 07/04/2023   NA 143 07/04/2023   K 3.9 07/04/2023   CL 105 07/04/2023   CO2 22 07/04/2023      Component Value Date/Time   PROT 6.8 07/04/2023 1018   ALBUMIN 4.2 07/04/2023 1018   AST 16 07/04/2023 1018   ALT 12 07/04/2023 1018   ALKPHOS 121  07/04/2023 1018   BILITOT 0.3 07/04/2023 1018   BILIDIR 0.2 04/29/2010 0956   Lab Results  Component Value Date   WBC 5.4 07/04/2023   HGB 13.4 07/04/2023   HCT 41.2 07/04/2023   MCV 92 07/04/2023   PLT 251 07/04/2023   Lab Results  Component Value Date   HGBA1C 5.2 07/04/2023   Component Ref Range & Units 07/04/2023  INSULIN 2.6 - 24.9 uIU/mL 8.1   Plan: - Unless pre-existing renal or cardiopulmonary conditions exist which patient was told to limit their fluid intake by another provider, I recommended roughly one half of their weight in pounds, to be the approximate ounces of non-caloric, non-caffeinated beverages they should drink per day; including more if they are engaging in exercise.  - We will continue to monitor levels regularly every 3-4 mo on average.   - Labs were reviewed with patient today and education provided on them. We discussed how the foods patient eats may influence these laboratory findings.  All of the patient's questions about them were answered    Other hyperlipidemia Assessment: Condition is Not at goal. This is diet/exercise controlled. Her LDL is elevated at 124 as of 07/04/2023. She endorses liking red meat a little too much.  Lab Results  Component Value Date   CHOL 208 (H) 07/04/2023   HDL 65 07/04/2023   LDLCALC 124 (H) 07/04/2023   TRIG 108 07/04/2023   CHOLHDL 3.0 01/05/2020   Plan:  - We extensively discussed several lifestyle modifications today and she will continue to work on diet, exercise and weight loss efforts.   - I stressed the importance that patient continue with our prudent nutritional plan that is low in saturated and trans fats, and low in fatty carbs to improve these numbers.   - We will continue routine screening as patient continues to achieve health goals along their weight loss journey  - Labs were reviewed with patient today and education provided on them. We discussed how the foods patient eats may influence these  laboratory findings.  All of the patient's questions about them were answered    TREATMENT PLAN FOR OBESITY: Obesity (BMI 30.0-34.9) - starting BMI 07/04/23-30.8 Assessment:  [name] is here to discuss her progress with her obesity treatment plan along with follow-up of her obesity related diagnoses. See Medical Weight Management Flowsheet for complete bioelectrical impedance results.  Condition is docourse: improving but not optimized.   Since last office visit patient's  Muscle mass has increased by 1.8lb. Fat mass has decreased by 3lb. Total body water has decreased by 1lb.  Counseling done on how various foods will affect these numbers and how to maximize success  Total lbs lost to date: 1lbs Total weight loss percentage to date: 0.53   Plan: - Continue to follow the Category 1 meal plan  with breakfast and lunch options with 8oz at dinner.   -  Informed her of individual tuna packets.   - I recommended that she can eat a little more then on plan as long as it is a lean protein.     Behavioral Intervention Additional resources provided today: category 1 meal plan information, breakfast options, and lunch options, and mindful eating handout, and insulin resistance handout. Evidence-based interventions for health behavior change were utilized today including the discussion of self monitoring techniques, problem-solving barriers and SMART goal setting techniques.   Regarding patient's less desirable eating habits and patterns, we employed the technique of small changes.  Pt will specifically work on: walk 30 mins 5 days a week or 20 minutes 7 days a week for next visit.   FOLLOW UP: Return in about 2 weeks (around 08/01/2023). She was informed of the importance of frequent follow up visits to maximize her success with intensive lifestyle modifications for her multiple health conditions.  Subjective:   Chief complaint: Obesity [name] is here to discuss her progress with her  obesity treatment plan. She is on the the Category 1 Plan and states she is following her eating plan approximately 90 % of the time. She states she is walking 20 minutes 4 days per week.  Interval History:  [name] is here today for her first follow-up office visit since starting the program with [place].  Since last office visit she has been ok. She states that when following the meal plan it made her more hungry consuming 3 meals a day. She has been persistent with following the meal plan. She usually eats grilled chicken with veggies. Pt did admit to weighing herself at home. She drinks about 3 bottles of water daily depending on what she does that day.   All blood work/ lab tests that were recently ordered by myself or an outside provider were reviewed with patient today per their request. Extended time was spent counseling her on all new disease processes that were discovered or preexisting ones that are affected by BMI.  she understands that many of these abnormalities will need to monitored regularly along with the current treatment plan of prudent dietary changes, in which we are making each and every office visit, to improve these health parameters.    Review of Systems:  Pertinent positives were addressed with patient today.  Weight Summary and Biometrics   Weight Lost Since Last Visit: 1lb  Weight Gained Since Last Visit: 0lb   Vitals Temp: 98.3 F (36.8 C) BP: 116/88 Pulse Rate: 76 SpO2: 97 %   Anthropometric Measurements Height: 5' 5.5" (1.664 m) Weight: 187 lb (84.8 kg) BMI (Calculated): 30.63 Weight at Last Visit: 188lb Weight Lost Since Last Visit: 1lb Weight Gained Since Last Visit: 0lb Starting Weight: 188lb Total Weight Loss (lbs): 1 lb (0.454 kg) Peak Weight: 188lb   Body Composition  Body Fat %: 43.2 % Fat Mass (lbs): 80.8 lbs Muscle Mass (lbs): 101 lbs Total Body Water (lbs): 73.6 lbs Visceral Fat Rating : 11   Other Clinical Data Fasting:  no Labs: no Today's Visit #: 2 Starting Date: 07/04/23     Objective:   PHYSICAL EXAM:  Blood pressure 116/88, pulse 76, temperature 98.3 F (36.8 C), height 5' 5.5" (1.664 m), weight 187 lb (84.8 kg), SpO2 97%. Body mass index is 30.65 kg/m.  General: Well Developed, well nourished, and in no acute distress.  HEENT: Normocephalic, atraumatic Skin: Warm and dry, cap RF less 2 sec, good turgor Chest:  Normal excursion, shape, no gross abn Respiratory:  speaking in full sentences, no conversational dyspnea NeuroM-Sk: Ambulates w/o assistance, moves * 4 Psych: A and O *3, insight good, mood-full  DIAGNOSTIC DATA REVIEWED:  BMET    Component Value Date/Time   NA 143 07/04/2023 1018   K 3.9 07/04/2023 1018   CL 105 07/04/2023 1018   CO2 22 07/04/2023 1018   GLUCOSE 82 07/04/2023 1018   GLUCOSE 101 (H) 06/21/2018 2055   BUN 11 07/04/2023 1018   CREATININE 1.16 (H) 07/04/2023 1018   CREATININE 0.90 07/31/2017 1443   CALCIUM 9.4 07/04/2023 1018   GFRNONAA 59 (L) 01/05/2020 1447   GFRAA 68 01/05/2020 1447   Lab Results  Component Value Date   HGBA1C 5.2 07/04/2023   HGBA1C 5.1 03/02/2022   Lab Results  Component Value Date   INSULIN 8.1 07/04/2023   Lab Results  Component Value Date   TSH 2.000 07/04/2023   CBC    Component Value Date/Time   WBC 5.4 07/04/2023 1018   WBC 7.5 06/21/2018 2055   RBC 4.48 07/04/2023 1018   RBC 4.82 03/02/2022 0000   HGB 13.4 07/04/2023 1018   HCT 41.2 07/04/2023 1018   PLT 251 07/04/2023 1018   MCV 92 07/04/2023 1018   MCH 29.9 07/04/2023 1018   MCH 29.8 06/21/2018 2055   MCHC 32.5 07/04/2023 1018   MCHC 32.0 06/21/2018 2055   RDW 13.1 07/04/2023 1018   Iron Studies No results found for: "IRON", "TIBC", "FERRITIN", "IRONPCTSAT" Lipid Panel     Component Value Date/Time   CHOL 208 (H) 07/04/2023 1018   TRIG 108 07/04/2023 1018   HDL 65 07/04/2023 1018   CHOLHDL 3.0 01/05/2020 1447   CHOLHDL 2.5 03/13/2016 0001    VLDL 22 03/13/2016 0001   LDLCALC 124 (H) 07/04/2023 1018   Hepatic Function Panel     Component Value Date/Time   PROT 6.8 07/04/2023 1018   ALBUMIN 4.2 07/04/2023 1018   AST 16 07/04/2023 1018   ALT 12 07/04/2023 1018   ALKPHOS 121 07/04/2023 1018   BILITOT 0.3 07/04/2023 1018   BILIDIR 0.2 04/29/2010 0956      Component Value Date/Time   TSH 2.000 07/04/2023 1018   Nutritional Lab Results  Component Value Date   VD25OH 36.8 07/04/2023   VD25OH 75 10/16/2011    Attestations:   Reviewed by clinician on day of visit: allergies, medications, problem list, medical history, surgical history, family history, social history, and previous encounter notes.   Patient was in the office today and time spent on visit including pre-visit chart review and post-visit care/coordination of care and electronic medical record documentation was 47 minutes. 50% of the time was in face to face counseling of this patient's medical condition(s) and providing education on treatment options to include the first-line treatment of diet and lifestyle modification.  I, Clinical biochemist, acting as a Stage manager for [employer], DO., have compiled all relevant documentation for today's office visit on behalf of Thomasene Lot, DO, while in the presence of [employer], DO.  I have reviewed the above documentation for accuracy and completeness, and I agree with the above. [name], D.O.  The 21st Century Cures Act was signed into law in 2016 which includes the topic of electronic health records.  This provides immediate access to information in MyChart.  This includes consultation notes, operative notes, office notes, lab results and pathology reports.  If you have any questions about what you read please let us know at your next  visit so we can discuss your concerns and take corrective action if need be.  We are right here with you.

## 2023-07-25 ENCOUNTER — Encounter: Payer: Self-pay | Admitting: Family Medicine

## 2023-08-08 ENCOUNTER — Ambulatory Visit (INDEPENDENT_AMBULATORY_CARE_PROVIDER_SITE_OTHER): Payer: Medicare Other | Admitting: Family Medicine

## 2023-08-13 ENCOUNTER — Ambulatory Visit (INDEPENDENT_AMBULATORY_CARE_PROVIDER_SITE_OTHER): Payer: Medicare Other | Admitting: Family Medicine

## 2023-08-13 ENCOUNTER — Encounter (INDEPENDENT_AMBULATORY_CARE_PROVIDER_SITE_OTHER): Payer: Self-pay

## 2023-08-15 ENCOUNTER — Ambulatory Visit (INDEPENDENT_AMBULATORY_CARE_PROVIDER_SITE_OTHER): Payer: Medicare Other | Admitting: Family Medicine

## 2023-08-15 ENCOUNTER — Encounter (INDEPENDENT_AMBULATORY_CARE_PROVIDER_SITE_OTHER): Payer: Self-pay | Admitting: Family Medicine

## 2023-08-15 VITALS — BP 116/81 | HR 78 | Temp 98.6°F | Ht 65.5 in | Wt 181.0 lb

## 2023-08-15 DIAGNOSIS — E669 Obesity, unspecified: Secondary | ICD-10-CM

## 2023-08-15 DIAGNOSIS — G43809 Other migraine, not intractable, without status migrainosus: Secondary | ICD-10-CM | POA: Diagnosis not present

## 2023-08-15 DIAGNOSIS — E559 Vitamin D deficiency, unspecified: Secondary | ICD-10-CM | POA: Diagnosis not present

## 2023-08-15 DIAGNOSIS — I10 Essential (primary) hypertension: Secondary | ICD-10-CM | POA: Insufficient documentation

## 2023-08-15 DIAGNOSIS — Z6829 Body mass index (BMI) 29.0-29.9, adult: Secondary | ICD-10-CM

## 2023-08-15 DIAGNOSIS — E88819 Insulin resistance, unspecified: Secondary | ICD-10-CM | POA: Diagnosis not present

## 2023-08-15 MED ORDER — VITAMIN D (ERGOCALCIFEROL) 1.25 MG (50000 UNIT) PO CAPS: 50000.0000 [IU] | ORAL_CAPSULE | ORAL | 0 refills | Status: DC

## 2023-08-15 MED ORDER — METFORMIN HCL 500 MG PO TABS
ORAL_TABLET | ORAL | 0 refills | Status: DC
Start: 2023-08-15 — End: 2023-09-18

## 2023-08-15 NOTE — Progress Notes (Signed)
[name], D.O.  ABFM, ABOM Specializing in Clinical Bariatric Medicine  Office located at: [address] [place], [place]  [address]     Assessment and Plan:   Medications Discontinued During This Encounter  Medication Reason   etanercept (ENBREL SURECLICK) 50 MG/ML injection    Vitamin D, Ergocalciferol, (DRISDOL) 1.25 MG (50000 UNIT) CAPS capsule Reorder    Meds ordered this encounter  Medications   Vitamin D, Ergocalciferol, (DRISDOL) 1.25 MG (50000 UNIT) CAPS capsule    Sig: Take 1 capsule (50,000 Units total) by mouth every 7 (seven) days.    Dispense:  5 capsule    Refill:  0   metFORMIN (GLUCOPHAGE) 500 MG tablet    Sig: 1/2 po with dinner daily    Dispense:  30 tablet    Refill:  0   Vitamin D deficiency Assessment: Condition is Not at goal.. This is being treated with Ergocalciferol 50K IU weekly and she has been on it for over 3 weeks now. She is tolerating this well and denies any adverse side effects.  Lab Results  Component Value Date   VD25OH 36.8 07/04/2023   VD25OH 75 10/16/2011   Plan: - Continue ERGO at current dose. I will refill this today.  - weight loss will likely improve availability of vitamin D, thus encouraged [name] to continue with meal plan and their weight loss efforts to further improve this condition.     Other migraine without status migrainosus, not intractable Assessment: Condition is Not optimized.. Pt informed me that she has a history of migraines for the past 30 years. She is usually able to control this. She informed me that she tends to get migraines when she goes on diets and has had a couple since starting the meal plan.She currently takes Topamax but not as directed.   Plan: - Continue with Topamax 100mg . She denies need for the XR version.   - I recommended follow-up with her PCP for further evaluation for this.    Insulin resistance Assessment: Condition is Not at goal.. This is diet/exercise controlled.  Her insulin is elevated at 8.1 as of 07/04/2023. She endorses having severe hunger and craving especially at night.  Lab Results  Component Value Date   HGBA1C 5.2 07/04/2023   HGBA1C 5.1 03/02/2022   INSULIN 8.1 07/04/2023    Plan: - Begin Metformin 500mg  half a tablet with dinner daily.   - We discussed the risks and benefits of various medication options which can help [place] in the management of this disease process as well as with weight loss. We discussed her chronic kidney disease and the risks and benefits Metformin can have on this. Pt wishes to proceed with this medicine and understands associated risks.   - We will closely follow her serum creatinine and lab results and repeat it in 6-8 weeks.   - Explained role of simple carbs and insulin levels on hunger and cravings.  - Handouts provided at pt's request after education provided.  All concerns/questions addressed.    - Anticipatory guidance given.    - We will recheck A1c and fasting insulin level in approximately 3 months from last check, or as deemed appropriate.    TREATMENT PLAN FOR OBESITY: Obesity (BMI 30.0-34.9) - starting BMI 07/04/23-30.8 Assessment:  [name] is here to discuss her progress with her obesity treatment plan along with follow-up of her obesity related diagnoses. See Medical Weight Management Flowsheet for complete bioelectrical impedance results.  Condition is  not optimized. Biometric data collected today, was reviewed with patient.   Since last office visit on 0821/2024 patient's  Muscle mass has decreased by 1.8lb. Fat mass has decreased by 3.4lb. Total body water has decreased by 3.2lb.  Counseling done on how various foods will affect these numbers and how to maximize success  Total lbs lost to date: 7 Total weight loss percentage to date: 3.72   Plan: [name] will work on healthier eating habits and try to follow the Category 1 meal plan with B and L options best they can.    Behavioral Intervention Additional resources provided today:  metformin and insulin resistance handout Evidence-based interventions for health behavior change were utilized today including the discussion of self monitoring techniques, problem-solving barriers and SMART goal setting techniques.   Regarding patient's less desirable eating habits and patterns, we employed the technique of small changes.  Pt will specifically work on: Following the meal plan for next visit.     She has agreed to Continue current level of physical activity    FOLLOW UP: Return in about 5 weeks (around 09/18/2023).  She was informed of the importance of frequent follow up visits to maximize her success with intensive lifestyle modifications for her multiple health conditions.  Subjective:   Chief complaint: Obesity [name] is here to discuss her progress with her obesity treatment plan. She is on the the Category 1 Plan with B and L options with 8oz at dinner and states she is following her eating plan approximately 75% of the time. She states she is walking 20 minutes 4 days per week.  Interval History:  [name] is here for a follow up office visit.     Since last office visit:  She has been ok. She informed me that she is starting to experience migraines. This usually happens when she goes on diets. She eats er P[employer] consultant at dinner and eats earlier than usually so she is hungry again by 9pm. Pt informed me that she gets hungry to the point where she cannot go to sleep. She endorses improving on her water intake.    We reviewed her meal plan and all questions were answered.    Pharmacotherapy for weight loss: She is currently taking Topamax for medical weight loss.  Denies side effects.    Review of Systems:  Pertinent positives were addressed with patient today.  Reviewed by clinician on day of visit: allergies, medications, problem list, medical history, surgical history, family history, social  history, and previous encounter notes.  Weight Summary and Biometrics   Weight Lost Since Last Visit: 6lb  Weight Gained Since Last Visit: 0lb   Vitals Temp: 98.6 F (37 C) BP: 116/81 Pulse Rate: 78 SpO2: 99 %   Anthropometric Measurements Height: 5' 5.5" (1.664 m) Weight: 181 lb (82.1 kg) BMI (Calculated): 29.65 Weight at Last Visit: 187lb Weight Lost Since Last Visit: 6lb Weight Gained Since Last Visit: 0lb Starting Weight: 188lb Total Weight Loss (lbs): 7 lb (3.175 kg) Peak Weight: 188lb   Body Composition  Body Fat %: 42.6 % Fat Mass (lbs): 77.4 lbs Muscle Mass (lbs): 99.2 lbs Total Body Water (lbs): 70.4 lbs Visceral Fat Rating : 11   Other Clinical Data Fasting: no Labs: no Today's Visit #: 3 Starting Date: 07/04/23     Objective:   PHYSICAL EXAM: Blood pressure 116/81, pulse 78, temperature 98.6 F (37 C), height 5' 5.5" (1.664 m), weight 181 lb (82.1 kg), SpO2 99%. Body mass index is  29.66 kg/m.  General: Well Developed, well nourished, and in no acute distress.  HEENT: Normocephalic, atraumatic Skin: Warm and dry, cap RF less 2 sec, good turgor Chest:  Normal excursion, shape, no gross abn Respiratory: speaking in full sentences, no conversational dyspnea NeuroM-Sk: Ambulates w/o assistance, moves * 4 Psych: A and O *3, insight good, mood-full  DIAGNOSTIC DATA REVIEWED:  BMET    Component Value Date/Time   NA 143 07/04/2023 1018   K 3.9 07/04/2023 1018   CL 105 07/04/2023 1018   CO2 22 07/04/2023 1018   GLUCOSE 82 07/04/2023 1018   GLUCOSE 101 (H) 06/21/2018 2055   BUN 11 07/04/2023 1018   CREATININE 1.16 (H) 07/04/2023 1018   CREATININE 0.90 07/31/2017 1443   CALCIUM 9.4 07/04/2023 1018   GFRNONAA 59 (L) 01/05/2020 1447   GFRAA 68 01/05/2020 1447   Lab Results  Component Value Date   HGBA1C 5.2 07/04/2023   HGBA1C 5.1 03/02/2022   Lab Results  Component Value Date   INSULIN 8.1 07/04/2023   Lab Results  Component Value  Date   TSH 2.000 07/04/2023   CBC    Component Value Date/Time   WBC 5.4 07/04/2023 1018   WBC 7.5 06/21/2018 2055   RBC 4.48 07/04/2023 1018   RBC 4.82 03/02/2022 0000   HGB 13.4 07/04/2023 1018   HCT 41.2 07/04/2023 1018   PLT 251 07/04/2023 1018   MCV 92 07/04/2023 1018   MCH 29.9 07/04/2023 1018   MCH 29.8 06/21/2018 2055   MCHC 32.5 07/04/2023 1018   MCHC 32.0 06/21/2018 2055   RDW 13.1 07/04/2023 1018   Iron Studies No results found for: "IRON", "TIBC", "FERRITIN", "IRONPCTSAT" Lipid Panel     Component Value Date/Time   CHOL 208 (H) 07/04/2023 1018   TRIG 108 07/04/2023 1018   HDL 65 07/04/2023 1018   CHOLHDL 3.0 01/05/2020 1447   CHOLHDL 2.5 03/13/2016 0001   VLDL 22 03/13/2016 0001   LDLCALC 124 (H) 07/04/2023 1018   Hepatic Function Panel     Component Value Date/Time   PROT 6.8 07/04/2023 1018   ALBUMIN 4.2 07/04/2023 1018   AST 16 07/04/2023 1018   ALT 12 07/04/2023 1018   ALKPHOS 121 07/04/2023 1018   BILITOT 0.3 07/04/2023 1018   BILIDIR 0.2 04/29/2010 0956      Component Value Date/Time   TSH 2.000 07/04/2023 1018   Nutritional Lab Results  Component Value Date   VD25OH 36.8 07/04/2023   VD25OH 75 10/16/2011    Attestations:   I, Clinical biochemist, acting as a Stage manager for [employer], DO., have compiled all relevant documentation for today's office visit on behalf of Thomasene Lot, DO, while in the presence of [employer], DO.  I have reviewed the above documentation for accuracy and completeness, and I agree with the above. [name], D.O.  The 21st Century Cures Act was signed into law in 2016 which includes the topic of electronic health records.  This provides immediate access to information in MyChart.  This includes consultation notes, operative notes, office notes, lab results and pathology reports.  If you have any questions about what you read please let us know at your next visit so we can discuss your  concerns and take corrective action if need be.  We are right here with you.

## 2023-08-28 ENCOUNTER — Ambulatory Visit (INDEPENDENT_AMBULATORY_CARE_PROVIDER_SITE_OTHER): Payer: Medicare Other | Admitting: Family Medicine

## 2023-08-28 ENCOUNTER — Encounter (INDEPENDENT_AMBULATORY_CARE_PROVIDER_SITE_OTHER): Payer: Self-pay

## 2023-09-11 DIAGNOSIS — M1991 Primary osteoarthritis, unspecified site: Secondary | ICD-10-CM | POA: Diagnosis not present

## 2023-09-11 DIAGNOSIS — M0579 Rheumatoid arthritis with rheumatoid factor of multiple sites without organ or systems involvement: Secondary | ICD-10-CM | POA: Diagnosis not present

## 2023-09-11 DIAGNOSIS — Z79899 Other long term (current) drug therapy: Secondary | ICD-10-CM | POA: Diagnosis not present

## 2023-09-11 DIAGNOSIS — R109 Unspecified abdominal pain: Secondary | ICD-10-CM | POA: Diagnosis not present

## 2023-09-17 ENCOUNTER — Encounter: Payer: Self-pay | Admitting: Medical

## 2023-09-17 ENCOUNTER — Ambulatory Visit (INDEPENDENT_AMBULATORY_CARE_PROVIDER_SITE_OTHER): Payer: Medicare Other | Admitting: Medical

## 2023-09-17 VITALS — BP 124/84 | HR 69 | Temp 98.2°F

## 2023-09-17 DIAGNOSIS — Z8619 Personal history of other infectious and parasitic diseases: Secondary | ICD-10-CM | POA: Diagnosis not present

## 2023-09-17 DIAGNOSIS — R21 Rash and other nonspecific skin eruption: Secondary | ICD-10-CM

## 2023-09-17 MED ORDER — VALACYCLOVIR HCL 1 G PO TABS: 1000.0000 mg | ORAL_TABLET | Freq: Three times a day (TID) | ORAL | 0 refills | Status: AC

## 2023-09-17 NOTE — Progress Notes (Signed)
Subjective:  [name] is a 62 y.o. female who presents for Chief Complaint  Patient presents with   Rash    Right side. Started Saturday. Tried benadryl and ice.      Here for c/o rash.  Started on face 2 days ago.  Rash is not itchy.   Feels tight or irritating.  Rash on right face, cheek, under eye, between eye, seems to be spreading.  Also feels rash on right index finger.  No specific trigger, contact, or new exposure.  Using oral benadryl.   Right eye has been swollen and puffy.  No specific new exposure.  No recent plant contact or new hygiene products  She does have history of shingles in the past on her face but she does not recall which side of the face was involved  No other aggravating or relieving factors.    No other c/o.  Past Medical History:  Diagnosis Date   Anemia    Anxiety    Back pain    Constipation    Decreased libido    on depo testosterone injections from GYN (Dr. Aldona Bar at [facility] 04/2015)   Depression    Edema    Fibromyalgia    Hemorrhoids    History of colon polyps    TUBULAR ADENOMA   History of kidney stones    History of pancreatitis    2001   Hydronephrosis, right    Hypertension    IBS (irritable bowel syndrome)    Medullary sponge kidney    Melanoma (HCC)    Migraine    Mild intermittent asthma    NO INHALER   Osteoarthritis    Pleural fibrosis    MILD SUBPLEURAL FIBROSIS-- LAST CT 2011--  PER PULMOLOGIST NOTE DR SOOD 2011   Rheumatoid arteritis (HCC)    SOB (shortness of breath) on exertion    Current Outpatient Medications on File Prior to Visit  Medication Sig Dispense Refill   aspirin 81 MG chewable tablet Chew 81 mg by mouth daily.     DULoxetine (CYMBALTA) 60 MG capsule TAKE 1 CAPSULE BY MOUTH DAILY 60 capsule 4   hydroxychloroquine (PLAQUENIL) 200 MG tablet Take 200 mg by mouth 2 (two) times daily.     losartan-hydrochlorothiazide (HYZAAR) 100-12.5 MG tablet Take 1 tablet by mouth daily. 90 tablet 3   Magnesium Oxide  (MAG-CAPS PO) Take 400 mg by mouth.     metFORMIN (GLUCOPHAGE) 500 MG tablet 1/2 po with dinner daily 30 tablet 0   pantoprazole (PROTONIX) 40 MG tablet 1 tablet     topiramate (TOPAMAX) 100 MG tablet TAKE 1 TABLET BY MOUTH 2 TIMES A DAY 60 tablet 1   Upadacitinib ER (RINVOQ) 15 MG TB24 Take 1 tablet by mouth daily.     Vitamin D, Ergocalciferol, (DRISDOL) 1.25 MG (50000 UNIT) CAPS capsule Take 1 capsule (50,000 Units total) by mouth every 7 (seven) days. 5 capsule 0   zolpidem (AMBIEN) 10 MG tablet Take 10 mg by mouth at bedtime as needed for sleep.     albuterol (VENTOLIN HFA) 108 (90 Base) MCG/ACT inhaler INHALE TWO PUFFS BY MOUTH EVERY 6 HOURS AS NEEDED FOR WHEEZING OR SHORTNESS OF BREATH (Patient not taking: Reported on 09/17/2023) 8.5 g 0   fluticasone (FLONASE) 50 MCG/ACT nasal spray Place 1 spray into both nostrils daily. (Patient not taking: Reported on 09/17/2023) 15.8 mL 0   methocarbamol (ROBAXIN) 500 MG tablet Take 500 mg by mouth 3 (three) times daily as needed for  muscle spasms. (Patient not taking: Reported on 09/17/2023)     ondansetron (ZOFRAN) 8 MG tablet Take 8 mg by mouth as needed for nausea or vomiting. (Patient not taking: Reported on 09/17/2023)     rizatriptan (MAXALT) 10 MG tablet TAKE 1 TABLET BY MOUTH AT ONSET OF HEADACHE. MAY REPEAT ONE TIME IN 2 HOURS IF NEEDED. NO MORE THAN 2 TABLETS PER DAY (Patient not taking: Reported on 09/17/2023) 10 tablet 3   No current facility-administered medications on file prior to visit.     The following portions of the patient's history were reviewed and updated as appropriate: allergies, current medications, past family history, past medical history, past social history, past surgical history and problem list.  ROS Otherwise as in subjective above  Objective: BP 124/84   Pulse 69   Temp 98.2 F (36.8 C) (Oral)   SpO2 96%   General appearance: alert, no distress, well developed, well nourished She has several small patches  of pinkish-red rash with possible early signs of papular or vesicular lesions.  No other rash patches are bigger than 5 mm diameter.  She has patches underneath the right eyelid, right cheek, lower right face, a small patch on the bridge of the nose, a small patch of erythema flat on the right neck No obvious deformity of either eye, no obvious rash or blister in the eye, PERRLA, EOMI    Assessment: Encounter Diagnoses  Name Primary?   Rash Yes   History of shingles      Plan: We discussed symptoms and concerns.  Rash is somewhat nonspecific.  We discussed the possibility of contact dermatitis versus possible early shingles.  She has had shingles on the face in the remote past but does not recall which side of the face it was on.  If this is shingles that suggest an early onset and not developed quite yet.  The rash is somewhat nonspecific.  To be safe, I will have her go ahead and start Valtrex.  We discussed potential signs and symptoms that could progressively get worse over the next few days if it is shingles.  Advised over-the-counter hydrocortisone cream to the rash but discussed handwashing and hygiene.  I asked her to go ahead and call her eye doctor to try to get into there today or tomorrow for evaluation in the event this may be early shingles  Can use over-the-counter anti-inflammatory such as ibuprofen as needed if the rash is worse causing some pain  I asked her to call back within the next 48 hours to give an update on symptoms and whether she saw the eye doctor or not.  If trouble getting appointment with an eye doctor urgently let me know and we will try to help make this happen  [name] was seen today for rash.  Diagnoses and all orders for this visit:  Rash  History of shingles  Other orders -     valACYclovir (VALTREX) 1000 MG tablet; Take 1 tablet (1,000 mg total) by mouth 3 (three) times daily for 10 days.    Follow up: prn

## 2023-09-18 ENCOUNTER — Telehealth (INDEPENDENT_AMBULATORY_CARE_PROVIDER_SITE_OTHER): Payer: Medicare Other | Admitting: Family Medicine

## 2023-09-18 DIAGNOSIS — E559 Vitamin D deficiency, unspecified: Secondary | ICD-10-CM | POA: Diagnosis not present

## 2023-09-18 DIAGNOSIS — E66811 Obesity, class 1: Secondary | ICD-10-CM | POA: Diagnosis not present

## 2023-09-18 DIAGNOSIS — Z6830 Body mass index (BMI) 30.0-30.9, adult: Secondary | ICD-10-CM

## 2023-09-18 DIAGNOSIS — E88819 Insulin resistance, unspecified: Secondary | ICD-10-CM | POA: Diagnosis not present

## 2023-09-18 MED ORDER — VITAMIN D (ERGOCALCIFEROL) 1.25 MG (50000 UNIT) PO CAPS: 50000.0000 [IU] | ORAL_CAPSULE | ORAL | 0 refills | Status: AC

## 2023-09-18 MED ORDER — METFORMIN HCL 500 MG PO TABS: ORAL_TABLET | ORAL | 0 refills | Status: AC

## 2023-09-18 NOTE — Progress Notes (Signed)
[name], D.O.  ABFM, ABOM Specializing in Clinical Bariatric Medicine  Office located at: [address] [place], [place]  [address]   MyChart Video Visit I connected with [name] on 09/18/23 at  2:00 PM EDT by video visit and verified that I am speaking with the correct person using two identifiers.   Other persons participating in the visit and their role in the encounter: medical scribe   Patient's location: home  Provider's location: clinic office    Assessment and Plan:   Medications Discontinued During This Encounter  Medication Reason   Vitamin D, Ergocalciferol, (DRISDOL) 1.25 MG (50000 UNIT) CAPS capsule Reorder   metFORMIN (GLUCOPHAGE) 500 MG tablet Reorder     Meds ordered this encounter  Medications   Vitamin D, Ergocalciferol, (DRISDOL) 1.25 MG (50000 UNIT) CAPS capsule    Sig: Take 1 capsule (50,000 Units total) by mouth every 7 (seven) days.    Dispense:  5 capsule    Refill:  0   metFORMIN (GLUCOPHAGE) 500 MG tablet    Sig: 1 po BID with meals    Dispense:  60 tablet    Refill:  0   Insulin resistance Assessment & Plan: Most recent fasting insulin of 8.1 on 07/04/23. She is taking Metformin 500 mg daily for her I.R without any adverse SE.  She reports noticing no improvement in her hunger and cravings since starting Metformin. In fact, her hunger has actually gotten worse since starting Rinvoq for her rheumatoid arthritis. She feels, however, that the Rinvoq has improved much of her arthritis symptoms. She has tried other medications in the past for arthritis with inadequate clinical response.   Discussed with pt that JAK-inhibitors can cause weight gain. We agreed mutually to increase Metformin to 500 mg bid. May consider GLP-1 medications in the future. Continue with weight loss therapy.    Vitamin D deficiency Assessment & Plan: Most recent vitamin D of 36.8 on 07/04/23. Reports good compliance and tolerance of ERGO 50,000 units  one a week. Continue with weight loss efforts and supplementation regiment; Will refill ERGO today.   Class 1 obesity due to excess calories with body mass index (BMI) of 30.0 to 30.9 in adult, unspecified whether serious comorbidity present Assessment & Plan: Biometrics not collected due to nature of visit.   Total lbs lost since 08/15/23: 7 lbs Total weight loss since 08/15/23: 3.72%   No change to meal plan - see Subjective  Behavioral Intervention Additional resources provided today: n/a Evidence-based interventions for health behavior change were utilized today including the discussion of self monitoring techniques, problem-solving barriers and SMART goal setting techniques.   Regarding patient's less desirable eating habits and patterns, we employed the technique of small changes.  Pt will specifically work on: n/a  FOLLOW UP: Return 10/02/23. She was informed of the importance of frequent follow up visits to maximize her success with intensive lifestyle modifications for her multiple health conditions.  Subjective:   Chief complaint: Obesity [name] is here to discuss her progress with her obesity treatment plan. She is on the Category 1 Plan with [employer] options and states she is following her eating plan approximately 100% of the time. She states she is walking 20-25 minutes 7 days per week.  Interval History:  [name] is here for a follow up office visit. This visit is through video because pt was told she has shingles. Pt reports 100% adherence to meal plan and is doing  well with her walking regiment. Pt endorses having more headaches and "feeling hungry all the time" since starting Rinvoq for her Rheumatoid arthritis. She feels, however, that the Rinvoq has improved much of her arthritis symptoms. She has tried other medications in the past for arthritis with inadequate clinical response. Food wise, this morning pt had 2 boiled eggs and a slice of wheat toast. For  lunch, she had 4 ounces of grilled chicken and broccoli.   Pharmacotherapy for weight loss: She is currently taking  Metformin 500 mg daily & Topamax 100 mg bid   for medical weight loss.  Denies side effects.    Review of Systems:  Pertinent positives were addressed with patient today.  Reviewed by clinician on day of visit: allergies, medications, problem list, medical history, surgical history, family history, social history, and previous encounter notes.  Weight Summary and Biometrics   No data recorded No data recorded   Vitals Temp: 98.2 F (36.8 C) BP: 124/84 Pulse Rate: 69 SpO2: 96 %   No data recorded No data recorded No data recorded  Objective:   PHYSICAL EXAM: There were no vitals taken for this visit. There is no height or weight on file to calculate BMI.  General: Well Developed, well nourished, and in no acute distress.  HEENT: Normocephalic, atraumatic Skin: Warm and dry, cap RF less 2 sec, good turgor Chest:  Normal excursion, shape, no gross abn Respiratory: speaking in full sentences, no conversational dyspnea NeuroM-Sk: Ambulates w/o assistance, moves * 4 Psych: A and O *3, insight good, mood-full  DIAGNOSTIC DATA REVIEWED:  BMET    Component Value Date/Time   NA 143 07/04/2023 1018   K 3.9 07/04/2023 1018   CL 105 07/04/2023 1018   CO2 22 07/04/2023 1018   GLUCOSE 82 07/04/2023 1018   GLUCOSE 101 (H) 06/21/2018 2055   BUN 11 07/04/2023 1018   CREATININE 1.16 (H) 07/04/2023 1018   CREATININE 0.90 07/31/2017 1443   CALCIUM 9.4 07/04/2023 1018   GFRNONAA 59 (L) 01/05/2020 1447   GFRAA 68 01/05/2020 1447   Lab Results  Component Value Date   HGBA1C 5.2 07/04/2023   HGBA1C 5.1 03/02/2022   Lab Results  Component Value Date   INSULIN 8.1 07/04/2023   Lab Results  Component Value Date   TSH 2.000 07/04/2023   CBC    Component Value Date/Time   WBC 5.4 07/04/2023 1018   WBC 7.5 06/21/2018 2055   RBC 4.48 07/04/2023 1018   RBC  4.82 03/02/2022 0000   HGB 13.4 07/04/2023 1018   HCT 41.2 07/04/2023 1018   PLT 251 07/04/2023 1018   MCV 92 07/04/2023 1018   MCH 29.9 07/04/2023 1018   MCH 29.8 06/21/2018 2055   MCHC 32.5 07/04/2023 1018   MCHC 32.0 06/21/2018 2055   RDW 13.1 07/04/2023 1018   Iron Studies No results found for: "IRON", "TIBC", "FERRITIN", "IRONPCTSAT" Lipid Panel     Component Value Date/Time   CHOL 208 (H) 07/04/2023 1018   TRIG 108 07/04/2023 1018   HDL 65 07/04/2023 1018   CHOLHDL 3.0 01/05/2020 1447   CHOLHDL 2.5 03/13/2016 0001   VLDL 22 03/13/2016 0001   LDLCALC 124 (H) 07/04/2023 1018   Hepatic Function Panel     Component Value Date/Time   PROT 6.8 07/04/2023 1018   ALBUMIN 4.2 07/04/2023 1018   AST 16 07/04/2023 1018   ALT 12 07/04/2023 1018   ALKPHOS 121 07/04/2023 1018   BILITOT 0.3 07/04/2023 1018  BILIDIR 0.2 04/29/2010 0956      Component Value Date/Time   TSH 2.000 07/04/2023 1018   Nutritional Lab Results  Component Value Date   VD25OH 36.8 07/04/2023   VD25OH 75 10/16/2011    Attestations:   I, Special Puri, acting as a Stage manager for Thomasene Lot, DO., have compiled all relevant documentation for today's office visit on behalf of Thomasene Lot, DO, while in the presence of [employer], DO.  I have reviewed the above documentation for accuracy and completeness, and I agree with the above. [name], D.O.  The 21st Century Cures Act was signed into law in 2016 which includes the topic of electronic health records.  This provides immediate access to information in MyChart.  This includes consultation notes, operative notes, office notes, lab results and pathology reports.  If you have any questions about what you read please let us know at your next visit so we can discuss your concerns and take corrective action if need be.  We are right here with you.

## 2023-09-20 DIAGNOSIS — M0579 Rheumatoid arthritis with rheumatoid factor of multiple sites without organ or systems involvement: Secondary | ICD-10-CM | POA: Diagnosis not present

## 2023-09-20 DIAGNOSIS — Z79899 Other long term (current) drug therapy: Secondary | ICD-10-CM | POA: Diagnosis not present

## 2023-10-02 ENCOUNTER — Encounter (INDEPENDENT_AMBULATORY_CARE_PROVIDER_SITE_OTHER): Payer: Self-pay

## 2023-10-02 ENCOUNTER — Ambulatory Visit (INDEPENDENT_AMBULATORY_CARE_PROVIDER_SITE_OTHER): Payer: Medicare Other | Admitting: Family Medicine

## 2023-11-04 ENCOUNTER — Other Ambulatory Visit (INDEPENDENT_AMBULATORY_CARE_PROVIDER_SITE_OTHER): Payer: Self-pay | Admitting: Family Medicine

## 2023-11-04 DIAGNOSIS — E559 Vitamin D deficiency, unspecified: Secondary | ICD-10-CM

## 2023-12-11 ENCOUNTER — Encounter: Payer: Self-pay | Admitting: Family Medicine

## 2023-12-18 DIAGNOSIS — M0579 Rheumatoid arthritis with rheumatoid factor of multiple sites without organ or systems involvement: Secondary | ICD-10-CM | POA: Diagnosis not present

## 2023-12-18 DIAGNOSIS — G894 Chronic pain syndrome: Secondary | ICD-10-CM | POA: Diagnosis not present

## 2023-12-18 DIAGNOSIS — R5382 Chronic fatigue, unspecified: Secondary | ICD-10-CM | POA: Diagnosis not present

## 2023-12-18 DIAGNOSIS — M1991 Primary osteoarthritis, unspecified site: Secondary | ICD-10-CM | POA: Diagnosis not present

## 2023-12-18 DIAGNOSIS — Z79899 Other long term (current) drug therapy: Secondary | ICD-10-CM | POA: Diagnosis not present

## 2023-12-24 ENCOUNTER — Other Ambulatory Visit: Payer: Self-pay | Admitting: Family Medicine

## 2023-12-25 ENCOUNTER — Ambulatory Visit: Payer: Medicare Other | Admitting: Family Medicine

## 2024-01-08 ENCOUNTER — Ambulatory Visit: Payer: Medicare Other

## 2024-01-08 DIAGNOSIS — Z0000 Encounter for general adult medical examination without abnormal findings: Secondary | ICD-10-CM | POA: Diagnosis not present

## 2024-01-08 NOTE — Progress Notes (Signed)
 Subjective:   [name] is a 63 y.o. female who presents for Medicare Annual (Subsequent) preventive examination.  Visit Complete: Virtual I connected with  [name] on 01/08/24 by a video and audio enabled telemedicine application and verified that I am speaking with the correct person using two identifiers.  Patient Location: Home  Provider Location: Office/Clinic  I discussed the limitations of evaluation and management by telemedicine. The patient expressed understanding and agreed to proceed.  Vital Signs: Because this visit was a virtual/telehealth visit, some criteria may be missing or patient reported. Any vitals not documented were not able to be obtained and vitals that have been documented are patient reported.  Patient Medicare AWV questionnaire was completed by the patient on 01/05/2024; I have confirmed that all information answered by patient is correct and no changes since this date.  Cardiac Risk Factors include: advanced age (>72men, >57 women);hypertension     Objective:    Today's Vitals   01/08/24 1437  PainSc: 5    There is no height or weight on file to calculate BMI.     01/08/2024    2:45 PM 01/02/2023    2:53 PM 01/30/2022    1:01 PM 10/14/2021    2:30 PM 03/31/2021    4:57 PM 06/21/2018    8:46 PM 07/13/2014   10:00 PM  Advanced Directives  Does Patient Have a Medical Advance Directive? No No No No No No Yes  Type of Advance Directive       Living will  Does patient want to make changes to medical advance directive?       No - Patient declined  Copy of Healthcare Power of Attorney in Chart?       No - copy requested  Would patient like information on creating a medical advance directive? No - Patient declined  No - Patient declined   No - Patient declined     Current Medications (verified) Outpatient Encounter Medications as of 01/08/2024  Medication Sig   albuterol (VENTOLIN HFA) 108 (90 Base) MCG/ACT inhaler INHALE TWO PUFFS BY MOUTH  EVERY 6 HOURS AS NEEDED FOR WHEEZING OR SHORTNESS OF BREATH   aspirin 81 MG chewable tablet Chew 81 mg by mouth daily.   DULoxetine (CYMBALTA) 60 MG capsule TAKE 1 CAPSULE BY MOUTH DAILY   fluticasone (FLONASE) 50 MCG/ACT nasal spray Place 1 spray into both nostrils daily.   hydroxychloroquine (PLAQUENIL) 200 MG tablet Take 200 mg by mouth 2 (two) times daily.   losartan-hydrochlorothiazide (HYZAAR) 100-12.5 MG tablet Take 1 tablet by mouth daily.   Magnesium Oxide (MAG-CAPS PO) Take 400 mg by mouth.   ondansetron (ZOFRAN) 8 MG tablet Take 8 mg by mouth as needed for nausea or vomiting.   pantoprazole (PROTONIX) 40 MG tablet 1 tablet   rizatriptan (MAXALT) 10 MG tablet TAKE 1 TABLET BY MOUTH AT ONSET OF HEADACHE. MAY REPEAT ONE TIME IN 2 HOURS IF NEEDED. NO MORE THAN 2 TABLETS PER DAY   topiramate (TOPAMAX) 100 MG tablet TAKE 1 TABLET BY MOUTH 2 TIMES A DAY   Upadacitinib ER (RINVOQ) 15 MG TB24 Take 1 tablet by mouth daily.   zolpidem (AMBIEN) 10 MG tablet Take 10 mg by mouth at bedtime as needed for sleep.   metFORMIN (GLUCOPHAGE) 500 MG tablet 1 po BID with meals   methocarbamol (ROBAXIN) 500 MG tablet Take 500 mg by mouth 3 (three) times daily as needed for muscle spasms. (Patient not taking: Reported on 09/17/2023)  Vitamin D, Ergocalciferol, (DRISDOL) 1.25 MG (50000 UNIT) CAPS capsule Take 1 capsule (50,000 Units total) by mouth every 7 (seven) days.   No facility-administered encounter medications on file as of 01/08/2024.    Allergies (verified) Celecoxib, Leflunomide, Methotrexate, and Penicillins   History: Past Medical History:  Diagnosis Date   Anemia    Anxiety    Back pain    Constipation    Decreased libido    on depo testosterone injections from GYN (Andrea. Aldona Webb at [facility] 04/2015)   Depression    Edema    Fibromyalgia    Hemorrhoids    History of colon polyps    TUBULAR ADENOMA   History of kidney stones    History of pancreatitis    2001   Hydronephrosis, right     Hypertension    IBS (irritable bowel syndrome)    Medullary sponge kidney    Melanoma (HCC)    Migraine    Mild intermittent asthma    NO INHALER   Osteoarthritis    Pleural fibrosis    MILD SUBPLEURAL FIBROSIS-- LAST CT 2011--  PER PULMOLOGIST NOTE [name] 2011   Rheumatoid arteritis (HCC)    SOB (shortness of breath) on exertion    Past Surgical History:  Procedure Laterality Date   ABDOMINAL HYSTERECTOMY  11/27/1994   CESAREAN SECTION  X2   COLONOSCOPY W/ POLYPECTOMY  2004  &  01-16-2011   CYSTOSCOPY WITH RETROGRADE PYELOGRAM, URETEROSCOPY AND STENT PLACEMENT Right 06/18/2014   Procedure: CYSTOSCOPY WITH RETROGRADE PYELOGRAM,  STENT PLACEMENT;  Surgeon: Andrea Chen, MD;  Location: [facility];  Service: Urology;  Laterality: Right;   CYSTOSCOPY/RETROGRADE/URETEROSCOPY Right 07/13/2014   Procedure: CYSTOSCOPY/RETROGRADE/RIGHT URETEROSCOPY AND REMOVAL OF RIGHT STENT;  Surgeon: Andrea Aus, MD;  Location: [facility];  Service: Urology;  Laterality: Right;   CYSTOSCOPY/RETROGRADE/URETEROSCOPY/STONE EXTRACTION WITH BASKET Right 06/08/2014   Procedure: CYSTOSCOPY RIGHT RETROGRADE/URETEROSCOPY/STONE EXTRACTION WITH BASKET WITH STENT PLACEMENT;  Surgeon: Andrea Aus, MD;  Location: [facility];  Service: Urology;  Laterality: Right;   ERCP  06/27/2000   EXTRACORPOREAL SHOCK WAVE LITHOTRIPSY Right 12/24/2012   EXTRACORPOREAL SHOCK WAVE LITHOTRIPSY Right 01/30/2022   Procedure: EXTRACORPOREAL SHOCK WAVE LITHOTRIPSY (ESWL);  Surgeon: Andrea Paci, MD;  Location: [facility];  Service: Urology;  Laterality: Right;   HOLMIUM LASER APPLICATION Right 06/08/2014   Procedure:  HOLMIUM LASER APPLICATION;  Surgeon: Andrea Aus, MD;  Location: [facility];  Service: Urology;  Laterality: Right;   HOLMIUM LASER APPLICATION Right 07/13/2014   Procedure: HOLMIUM LASER APPLICATION;  Surgeon: Andrea Aus,  MD;  Location: [facility];  Service: Urology;  Laterality: Right;   LAPAROSCOPIC CHOLECYSTECTOMY  [phone]   melanoma removal     01/2023, right side of face   PARTIAL NEPHRECTOMY Bilateral LEFT  05-18-2006/   RIGHT 1998   LEFT (ANGIOMYOLIPOMA)   RIGHT (BENIGN TUMOR)   POLYPECTOMY  06/29/2023   tubular adenoma colonic polyp   TOTAL ABDOMINAL [place]ST[place]  11[place]27/1994   [place]   TUBAL LIGATION  11/28/1991   Family History  Problem Relation Age of Onset   Hypertension Mother    Obesity Mother    Cancer Father    Social History   Socioeconomic History   Marital status: Divorced    Spouse name: Not on file   Number of children: Not on file   Years of education: Not on file   Highest education level: Not on file  Occupational  History   Not on file  Tobacco Use   Smoking status: Never   Smokeless tobacco: Never  Vaping Use   Vaping status: Never Used  Substance and Sexual Activity   Alcohol use: No   Drug use: No   Sexual activity: Yes  Other Topics Concern   Not on file  Social History Narrative   Not on file   Social Drivers of Health   Financial Resource Strain: Low Risk  (01/08/2024)   Overall Financial Resource Strain (CARDIA)    Difficulty of Paying Living Expenses: Not hard at all  Food Insecurity: No Food Insecurity (01/08/2024)   Hunger Vital Sign    Worried About Running Out of Food in the Last Year: Never true    Ran Out of Food in the Last Year: Never true  Transportation Needs: No Transportation Needs (01/08/2024)   PRAPARE - Administrator, Civil Service (Medical): No    Lack of Transportation (Non-Medical): No  Physical Activity: Insufficiently Active (01/08/2024)   Exercise Vital Sign    Days of Exercise per Week: 7 days    Minutes of Exercise per Session: 10 min  Stress: No Stress Concern Present (01/08/2024)   [employer] of Occupational Health - Occupational Stress Questionnaire    Feeling of Stress : Not at all  Social Connections:  Socially Isolated (01/08/2024)   Social Connection and Isolation Panel [NHANES]    Frequency of Communication with Friends and Family: More than three times a week    Frequency of Social Gatherings with Friends and Family: Never    Attends Religious Services: Never    Database administrator or Organizations: No    Attends Engineer, structural: Never    Marital Status: Divorced    Tobacco Counseling Counseling given: Not Answered   Clinical Intake:  Pre-visit preparation completed: Yes  Pain : 0-10 Pain Score: 5  Pain Type: Chronic pain Pain Location: Generalized (knees, hands, and ankles) Pain Descriptors / Indicators: Aching Pain Onset: More than a month ago Pain Frequency: Constant     Nutritional Risks: None Diabetes: No     Interpreter Needed?: No  Information entered by :: NAllen LPN   Activities of Daily Living    01/05/2024   12:28 PM  In your present state of health, do you have any difficulty performing the following activities:  Hearing? 0  Vision? 0  Difficulty concentrating or making decisions? 0  Walking or climbing stairs? 0  Dressing or bathing? 0  Doing errands, shopping? 0  Preparing Food and eating ? N  Using the Toilet? N  In the past six months, have you accidently leaked urine? N  Do you have problems with loss of bowel control? N  Managing your Medications? N  Managing your Finances? N  Housekeeping or managing your Housekeeping? N    Patient Care Team: Ronnald Nian, MD as PCP - General (Family Medicine)  Indicate any recent Medical Services you may have received from other than [facility] providers in the past year (date may be approximate).     Assessment:   This is a routine wellness examination for [facility]  Hearing/Vision screen Hearing Screening - Comments:: Denies hearing issues Vision Screening - Comments:: Regular eye exams, [facility]   Goals Addressed             This Visit's Progress    Patient Stated        01/08/2024, wants to lose weight  Depression Screen    01/08/2024    2:46 PM 01/02/2023    2:54 PM 10/14/2021    2:32 PM  PHQ 2/9 Scores  PHQ - 2 Score 0 0 0  PHQ- 9 Score 4 3     Fall Risk    01/05/2024   12:28 PM 01/02/2023    2:53 PM 01/01/2023    4:57 PM 10/14/2021    2:31 PM 10/10/2021    5:01 PM  Fall Risk   Falls in the past year? 1 0 0 1 1  Comment stumbles   loses balance and trips   Number falls in past yr: 1 0  1 1  Injury with Fall? 0 0  0   Risk for fall due to : History of fall(s);Medication side effect Medication side effect  Impaired balance/gait;Medication side effect   Follow up Falls prevention discussed;Falls evaluation completed Falls prevention discussed;Education provided;Falls evaluation completed  Falls evaluation completed;Education provided;Falls prevention discussed     MEDICARE RISK AT HOME: Medicare Risk at Home Any stairs in or around the home?: (Patient-Rptd) Yes If so, are there any without handrails?: (Patient-Rptd) No Home free of loose throw rugs in walkways, pet beds, electrical cords, etc?: (Patient-Rptd) Yes Adequate lighting in your home to reduce risk of falls?: (Patient-Rptd) Yes Life alert?: (Patient-Rptd) No Use of a cane, walker or w/c?: (Patient-Rptd) No Grab bars in the bathroom?: (Patient-Rptd) Yes Shower chair or bench in shower?: (Patient-Rptd) No Elevated toilet seat or a handicapped toilet?: (Patient-Rptd) No  TIMED UP AND GO:  Was the test performed?  No    Cognitive Function:        01/08/2024    2:48 PM 01/02/2023    2:55 PM 10/14/2021    2:33 PM  6CIT Screen  What Year? 0 points 0 points 0 points  What month? 0 points 0 points 0 points  What time? 0 points 0 points 0 points  Count back from 20 0 points 0 points 0 points  Months in reverse 0 points 0 points 0 points  Repeat phrase 0 points 2 points 4 points  Total Score 0 points 2 points 4 points    Immunizations Immunization History  Administered  Date(s) Administered   Influenza Split 09/12/2011   Influenza,inj,Quad PF,6+ Mos 08/01/2016, 10/03/2018   Influenza,trivalent, recombinat, inj, PF 08/10/2023   Influenza-Unspecified 07/28/2018   Moderna Sars-Covid-2 Vaccination 06/04/2020, 10/15/2020   Pneumococcal Conjugate-13 10/03/2018   Pneumococcal Polysaccharide-23 06/01/2021   Tdap 04/08/2018   Zoster Recombinant(Shingrix) 08/07/2022   Zoster, Unspecified 08/10/2023    TDAP status: Up to date  Flu Vaccine status: Up to date  Pneumococcal vaccine status: Up to date  Covid-19 vaccine status: Information provided on how to obtain vaccines.   Qualifies for Shingles Vaccine? Yes   Zostavax completed Yes   Shingrix Completed?: Yes  Screening Tests Health Maintenance  Topic Date Due   HIV Screening  Never done   COVID-19 Vaccine (3 - Moderna risk series) 11/12/2020   Medicare Annual Wellness (AWV)  01/07/2025   MAMMOGRAM  04/03/2025   Pneumococcal Vaccine 24-40 Years old (3 of 3 - PPSV23 or PCV20) 06/01/2026   DTaP/Tdap/Td (2 - Td or Tdap) 04/08/2028   Colonoscopy  06/28/2033   INFLUENZA VACCINE  Completed   Hepatitis C Screening  Completed   Zoster Vaccines- Shingrix  Completed   HPV VACCINES  Aged Out    Health Maintenance  Health Maintenance Due  Topic Date Due   HIV Screening  Never  done   COVID-19 Vaccine (3 - Moderna risk series) 11/12/2020    Colorectal cancer screening: Type of screening: Colonoscopy. Completed 06/29/2023. Repeat every 5 years  Mammogram status: Completed 04/04/2023. Repeat every year  Bone Density status: Completed 01/06/2021.   Lung Cancer Screening: (Low Dose CT Chest recommended if Age 63-80 years, 20 pack-year currently smoking OR have quit w/in 15years.) does not qualify.   Lung Cancer Screening Referral: no  Additional Screening:  Hepatitis C Screening: does qualify; Completed 04/29/2010  Vision Screening: Recommended annual ophthalmology exams for early detection of glaucoma and  other disorders of the eye. Is the patient up to date with their annual eye exam?  Yes  Who is the provider or what is the name of the office in which the patient attends annual eye exams? [facility] If pt is not established with a provider, would they like to be referred to a provider to establish care? No .   Dental Screening: Recommended annual dental exams for proper oral hygiene  Diabetic Foot Exam: n/a  Community Resource Referral / Chronic Care Management: CRR required this visit?  No   CCM required this visit?  No     Plan:     I have personally reviewed and noted the following in the patient's chart:   Medical and social history Use of alcohol, tobacco or illicit drugs  Current medications and supplements including opioid prescriptions. Patient is not currently taking opioid prescriptions. Functional ability and status Nutritional status Physical activity Advanced directives List of other physicians Hospitalizations, surgeries, and ER visits in previous 12 months Vitals Screenings to include cognitive, depression, and falls Referrals and appointments  In addition, I have reviewed and discussed with patient certain preventive protocols, quality metrics, and best practice recommendations. A written personalized care plan for preventive services as well as general preventive health recommendations were provided to patient.     Barb Merino, LPN   [id number]   After Visit Summary: (MyChart) Due to this being a telephonic visit, the after visit summary with patients personalized plan was offered to patient via MyChart   Nurse Notes: none

## 2024-01-08 NOTE — Patient Instructions (Signed)
[name] , Thank you for taking time to come for your Medicare Wellness Visit. I appreciate your ongoing commitment to your health goals. Please review the following plan we discussed and let me know if I can assist you in the future.   Referrals/Orders/Follow-Ups/Clinician Recommendations: none  This is a list of the screening recommended for you and due dates:  Health Maintenance  Topic Date Due   HIV Screening  Never done   COVID-19 Vaccine (3 - Moderna risk series) 11/12/2020   Medicare Annual Wellness Visit  01/07/2025   Mammogram  04/03/2025   Pneumococcal Vaccination (3 of 3 - PPSV23 or PCV20) 06/01/2026   DTaP/Tdap/Td vaccine (2 - Td or Tdap) 04/08/2028   Colon Cancer Screening  06/28/2033   Flu Shot  Completed   Hepatitis C Screening  Completed   Zoster (Shingles) Vaccine  Completed   HPV Vaccine  Aged Out    Advanced directives: (ACP Link)Information on Advanced Care Planning can be found at [facility] [place] Advance Health Care Directives Advance Health Care Directives ([email or web address]   Next Medicare Annual Wellness Visit scheduled for next year: Yes  insert Preventive Care attachment Insert FALL PREVENTION attachment if needed

## 2024-01-09 ENCOUNTER — Ambulatory Visit: Payer: Medicare Other | Admitting: Family Medicine

## 2024-01-14 DIAGNOSIS — L814 Other melanin hyperpigmentation: Secondary | ICD-10-CM | POA: Diagnosis not present

## 2024-01-14 DIAGNOSIS — Z85820 Personal history of malignant melanoma of skin: Secondary | ICD-10-CM | POA: Diagnosis not present

## 2024-01-14 DIAGNOSIS — D485 Neoplasm of uncertain behavior of skin: Secondary | ICD-10-CM | POA: Diagnosis not present

## 2024-01-14 DIAGNOSIS — Z08 Encounter for follow-up examination after completed treatment for malignant neoplasm: Secondary | ICD-10-CM | POA: Diagnosis not present

## 2024-01-14 DIAGNOSIS — L308 Other specified dermatitis: Secondary | ICD-10-CM | POA: Diagnosis not present

## 2024-01-14 DIAGNOSIS — L821 Other seborrheic keratosis: Secondary | ICD-10-CM | POA: Diagnosis not present

## 2024-01-22 ENCOUNTER — Encounter: Payer: Self-pay | Admitting: Internal Medicine

## 2024-02-19 ENCOUNTER — Other Ambulatory Visit: Payer: Self-pay | Admitting: Family Medicine

## 2024-02-19 DIAGNOSIS — G43809 Other migraine, not intractable, without status migrainosus: Secondary | ICD-10-CM

## 2024-02-20 NOTE — Telephone Encounter (Signed)
 Please schedule pt an appointment as its been over a year since seen by Dr. Susann Givens for cpe

## 2024-02-21 ENCOUNTER — Other Ambulatory Visit: Payer: Self-pay | Admitting: Family Medicine

## 2024-02-21 NOTE — Telephone Encounter (Signed)
 Left voicemail and sent My Chart message for pt to schedule physical

## 2024-02-22 ENCOUNTER — Telehealth: Payer: Self-pay | Admitting: Family Medicine

## 2024-02-22 ENCOUNTER — Other Ambulatory Visit: Payer: Self-pay

## 2024-02-22 DIAGNOSIS — G43809 Other migraine, not intractable, without status migrainosus: Secondary | ICD-10-CM

## 2024-02-22 MED ORDER — TOPIRAMATE 100 MG PO TABS: 100.0000 mg | ORAL_TABLET | Freq: Two times a day (BID) | ORAL | 1 refills | Status: DC

## 2024-02-22 MED ORDER — RIZATRIPTAN BENZOATE 10 MG PO TABS: ORAL_TABLET | ORAL | 3 refills | Status: DC

## 2024-02-22 NOTE — Telephone Encounter (Signed)
 Pt needs refills on rizatriptan and topiramate  She has scheduled CPE it is for May 29

## 2024-03-18 DIAGNOSIS — R5382 Chronic fatigue, unspecified: Secondary | ICD-10-CM | POA: Diagnosis not present

## 2024-03-18 DIAGNOSIS — G894 Chronic pain syndrome: Secondary | ICD-10-CM | POA: Diagnosis not present

## 2024-03-18 DIAGNOSIS — M0579 Rheumatoid arthritis with rheumatoid factor of multiple sites without organ or systems involvement: Secondary | ICD-10-CM | POA: Diagnosis not present

## 2024-03-18 DIAGNOSIS — M1991 Primary osteoarthritis, unspecified site: Secondary | ICD-10-CM | POA: Diagnosis not present

## 2024-03-18 DIAGNOSIS — Z79899 Other long term (current) drug therapy: Secondary | ICD-10-CM | POA: Diagnosis not present

## 2024-04-19 ENCOUNTER — Other Ambulatory Visit: Payer: Self-pay | Admitting: Family Medicine

## 2024-04-19 DIAGNOSIS — I10 Essential (primary) hypertension: Secondary | ICD-10-CM

## 2024-04-20 ENCOUNTER — Other Ambulatory Visit: Payer: Self-pay | Admitting: Family Medicine

## 2024-04-22 NOTE — Telephone Encounter (Signed)
 Refill approved.

## 2024-04-24 ENCOUNTER — Encounter: Admitting: Family Medicine

## 2024-04-24 DIAGNOSIS — E559 Vitamin D deficiency, unspecified: Secondary | ICD-10-CM

## 2024-04-24 DIAGNOSIS — J8410 Pulmonary fibrosis, unspecified: Secondary | ICD-10-CM

## 2024-04-24 DIAGNOSIS — J301 Allergic rhinitis due to pollen: Secondary | ICD-10-CM | POA: Insufficient documentation

## 2024-04-24 DIAGNOSIS — Z0000 Encounter for general adult medical examination without abnormal findings: Secondary | ICD-10-CM

## 2024-04-24 DIAGNOSIS — G43001 Migraine without aura, not intractable, with status migrainosus: Secondary | ICD-10-CM

## 2024-04-24 DIAGNOSIS — Z630 Problems in relationship with spouse or partner: Secondary | ICD-10-CM

## 2024-04-24 DIAGNOSIS — J4520 Mild intermittent asthma, uncomplicated: Secondary | ICD-10-CM

## 2024-04-24 DIAGNOSIS — F3289 Other specified depressive episodes: Secondary | ICD-10-CM

## 2024-04-24 DIAGNOSIS — Z860101 Personal history of adenomatous and serrated colon polyps: Secondary | ICD-10-CM

## 2024-04-24 DIAGNOSIS — M8580 Other specified disorders of bone density and structure, unspecified site: Secondary | ICD-10-CM

## 2024-04-24 DIAGNOSIS — I10 Essential (primary) hypertension: Secondary | ICD-10-CM

## 2024-04-24 DIAGNOSIS — M05741 Rheumatoid arthritis with rheumatoid factor of right hand without organ or systems involvement: Secondary | ICD-10-CM

## 2024-04-24 DIAGNOSIS — Z13220 Encounter for screening for lipoid disorders: Secondary | ICD-10-CM

## 2024-04-25 ENCOUNTER — Encounter: Admitting: Family Medicine

## 2024-04-25 DIAGNOSIS — J4520 Mild intermittent asthma, uncomplicated: Secondary | ICD-10-CM

## 2024-04-25 DIAGNOSIS — M8580 Other specified disorders of bone density and structure, unspecified site: Secondary | ICD-10-CM

## 2024-04-25 DIAGNOSIS — E559 Vitamin D deficiency, unspecified: Secondary | ICD-10-CM

## 2024-04-25 DIAGNOSIS — Z860101 Personal history of adenomatous and serrated colon polyps: Secondary | ICD-10-CM

## 2024-04-25 DIAGNOSIS — Z13220 Encounter for screening for lipoid disorders: Secondary | ICD-10-CM

## 2024-04-25 DIAGNOSIS — Z0000 Encounter for general adult medical examination without abnormal findings: Secondary | ICD-10-CM

## 2024-04-25 DIAGNOSIS — J301 Allergic rhinitis due to pollen: Secondary | ICD-10-CM

## 2024-04-25 DIAGNOSIS — M05741 Rheumatoid arthritis with rheumatoid factor of right hand without organ or systems involvement: Secondary | ICD-10-CM

## 2024-05-06 ENCOUNTER — Encounter: Payer: Self-pay | Admitting: Family Medicine

## 2024-05-06 ENCOUNTER — Ambulatory Visit (INDEPENDENT_AMBULATORY_CARE_PROVIDER_SITE_OTHER): Admitting: Family Medicine

## 2024-05-06 VITALS — BP 122/88 | HR 67 | Ht 65.0 in | Wt 200.2 lb

## 2024-05-06 DIAGNOSIS — Z860101 Personal history of adenomatous and serrated colon polyps: Secondary | ICD-10-CM

## 2024-05-06 DIAGNOSIS — M8580 Other specified disorders of bone density and structure, unspecified site: Secondary | ICD-10-CM

## 2024-05-06 DIAGNOSIS — J8410 Pulmonary fibrosis, unspecified: Secondary | ICD-10-CM

## 2024-05-06 DIAGNOSIS — G479 Sleep disorder, unspecified: Secondary | ICD-10-CM | POA: Diagnosis not present

## 2024-05-06 DIAGNOSIS — N1831 Chronic kidney disease, stage 3a: Secondary | ICD-10-CM

## 2024-05-06 DIAGNOSIS — J301 Allergic rhinitis due to pollen: Secondary | ICD-10-CM

## 2024-05-06 DIAGNOSIS — M05741 Rheumatoid arthritis with rheumatoid factor of right hand without organ or systems involvement: Secondary | ICD-10-CM

## 2024-05-06 DIAGNOSIS — M05742 Rheumatoid arthritis with rheumatoid factor of left hand without organ or systems involvement: Secondary | ICD-10-CM

## 2024-05-06 DIAGNOSIS — G43809 Other migraine, not intractable, without status migrainosus: Secondary | ICD-10-CM | POA: Diagnosis not present

## 2024-05-06 DIAGNOSIS — F329 Major depressive disorder, single episode, unspecified: Secondary | ICD-10-CM

## 2024-05-06 DIAGNOSIS — Z0000 Encounter for general adult medical examination without abnormal findings: Secondary | ICD-10-CM

## 2024-05-06 DIAGNOSIS — I10 Essential (primary) hypertension: Secondary | ICD-10-CM

## 2024-05-06 DIAGNOSIS — J4520 Mild intermittent asthma, uncomplicated: Secondary | ICD-10-CM | POA: Diagnosis not present

## 2024-05-06 MED ORDER — TRAZODONE HCL 50 MG PO TABS: 25.0000 mg | ORAL_TABLET | Freq: Every evening | ORAL | 3 refills | Status: DC | PRN

## 2024-05-06 MED ORDER — SUMATRIPTAN SUCCINATE 100 MG PO TABS
100.0000 mg | ORAL_TABLET | ORAL | 0 refills | Status: AC | PRN
Start: 2024-05-06 — End: ?

## 2024-05-06 NOTE — Patient Instructions (Signed)
 Call [facility] [phone]

## 2024-05-06 NOTE — Progress Notes (Signed)
 Complete physical exam  Patient: [name]   DOB: [birth year]   63 y.o. Female  MRN: [id number]  Subjective:     Chief Complaint  Patient presents with   Annual Exam    Cpe. Nonfasting.     [name] is a 63 y.o. female who presents today for a complete physical exam.  She reports consuming a general diet. Walking  She generally feels been in a lot of pain lately. She reports sleeping poorly.  She was started on Ambien quite sometime ago by her gynecologist and continues on that.  She now recognizes that it is not really helping with her sleep.  She does have underlying rheumatoid arthritis and is being followed by rheumatology for this.  She states that this is causing a lot of pain.  She also has concerns over possible lupus is apparently some the test did show l borderline numbers.  In the past she was seen by pain clinic but presently not going there.  She also has a history of migraine headaches and is getting 3/month.  She states that Maxalt  is really not helping much.  She also has been taking Topamax .  She continues on Cymbalta  to help with generalized pain as well as depression but feels that she does not have this under good control.  In the past she has had difficulty with depression and apparently was admitted to the psych unit for suicide attempt.  Apparently she was tried on multiple medications after that hospitalization as well as getting counseling but could not follow-up stating that every time she got involved in counseling the counselor would change. She has been involved with medical weight loss and wellness but again found this to not be very successful in helping her lose weight.  She is interested in other medications. Most recent fall risk assessment:    05/06/2024    2:56 PM  Fall Risk   Falls in the past year? 1  Number falls in past yr: 0  Injury with Fall? 1  Risk for fall due to : History of fall(s)  Follow up Falls evaluation completed      Most recent depression screenings:    05/06/2024    2:56 PM 01/08/2024    2:46 PM  PHQ 2/9 Scores  PHQ - 2 Score 0 0  PHQ- 9 Score  4    Vision:Within last year and Dental: No current dental problems and Receives regular dental care    Immunization History  Administered Date(s) Administered   Influenza Split 09/12/2011   Influenza,inj,Quad PF,6+ Mos 08/01/2016, 10/03/2018   Influenza,trivalent, recombinat, inj, PF 08/10/2023   Influenza-Unspecified 07/28/2018   Moderna Sars-Covid-2 Vaccination 06/04/2020, 10/15/2020   Pneumococcal Conjugate-13 10/03/2018   Pneumococcal Polysaccharide-23 06/01/2021   Tdap 04/08/2018   Zoster Recombinant(Shingrix) 08/07/2022   Zoster, Unspecified 08/10/2023    Health Maintenance  Topic Date Due   HIV Screening  Never done   COVID-19 Vaccine (3 - Moderna risk series) 11/12/2020   INFLUENZA VACCINE  06/27/2024   Medicare Annual Wellness (AWV)  01/07/2025   MAMMOGRAM  04/03/2025   Pneumococcal Vaccine 63-9 Years old (3 of 3 - PPSV23, PCV20 or PCV21) 06/01/2026   DTaP/Tdap/Td (2 - Td or Tdap) 04/08/2028   Colonoscopy  06/28/2033   Hepatitis C Screening  Completed   Zoster Vaccines- Shingrix  Completed   HPV VACCINES  Aged Out   Meningococcal B Vaccine  Aged Out    Patient Care Team: Watson Hacking,  MD as PCP - General (Family Medicine)   Outpatient Medications Prior to Visit  Medication Sig   albuterol  (VENTOLIN  HFA) 108 (90 Base) MCG/ACT inhaler INHALE TWO PUFFS BY MOUTH EVERY 6 HOURS AS NEEDED FOR WHEEZING OR SHORTNESS OF BREATH   aspirin 81 MG chewable tablet Chew 81 mg by mouth daily.   DULoxetine  (CYMBALTA ) 60 MG capsule TAKE 1 CAPSULE BY MOUTH DAILY   hydroxychloroquine (PLAQUENIL) 200 MG tablet Take 200 mg by mouth 2 (two) times daily.   losartan -hydrochlorothiazide  (HYZAAR) 100-12.5 MG tablet TAKE 1 TABLET BY MOUTH DAILY   Magnesium Oxide (MAG-CAPS PO) Take 400 mg by mouth.   ondansetron  (ZOFRAN ) 8 MG tablet Take 8 mg by  mouth as needed for nausea or vomiting.   pantoprazole (PROTONIX) 40 MG tablet 1 tablet   topiramate  (TOPAMAX ) 100 MG tablet TAKE 1 TABLET BY MOUTH 2 TIMES A DAY   Upadacitinib ER (RINVOQ) 15 MG TB24 Take 1 tablet by mouth daily.   [DISCONTINUED] rizatriptan  (MAXALT ) 10 MG tablet TAKE 1 TABLET BY MOUTH AT ONSET OF HEADACHE. MAY REPEAT ONE TIME IN 2 HOURS IF NEEDED. NO MORE THAN 2 TABLETS PER DAY   [DISCONTINUED] zolpidem (AMBIEN) 10 MG tablet Take 10 mg by mouth at bedtime as needed for sleep.   fluticasone  (FLONASE ) 50 MCG/ACT nasal spray Place 1 spray into both nostrils daily. (Patient not taking: Reported on 05/06/2024)   metFORMIN  (GLUCOPHAGE ) 500 MG tablet 1 po BID with meals   methocarbamol (ROBAXIN) 500 MG tablet Take 500 mg by mouth 3 (three) times daily as needed for muscle spasms. (Patient not taking: Reported on 09/17/2023)   Vitamin D , Ergocalciferol , (DRISDOL ) 1.25 MG (50000 UNIT) CAPS capsule Take 1 capsule (50,000 Units total) by mouth every 7 (seven) days.   No facility-administered medications prior to visit.    Review of Systems  All other systems reviewed and are negative.   Family and social history as well as health maintenance and immunizations was reviewed.     Objective:       Physical Exam  Alert and in no distress. Tympanic membranes and canals are normal. Pharyngeal area is normal. Neck is supple without adenopathy or thyromegaly. Cardiac exam shows a regular sinus rhythm without murmurs or gallops. Lungs are clear to auscultation.      Assessment & Plan:     Routine general medical examination at a health care facility  Other migraine without status migrainosus, not intractable - Plan: SUMAtriptan (IMITREX) 100 MG tablet  Seasonal allergic rhinitis due to pollen  Rheumatoid arthritis involving both hands with positive rheumatoid factor (HCC) - Plan: CBC with Differential/Platelet, Comprehensive metabolic panel with GFR  Mild intermittent reactive  airway disease without complication  Osteopenia, unspecified location  INTERSTITIAL LUNG DISEASE  Hx of adenomatous colonic polyps  Essential hypertension - Plan: CBC with Differential/Platelet, Comprehensive metabolic panel with GFR  Reactive depression  Sleep disturbance - Plan: traZODone  (DESYREL ) 50 MG tablet  I explained to her that she is on multiple medications that can interfere with sleep and with each other in regard to pain management, migraine, depression. She also wants to lose weight which complicates this again.  I explained that the best way to handle this would be to get involved in counseling as well as I will need help from psychiatry to help manage her medications and switch some these medicines around since they do have potential adverse effects. Return in about 1 year (around 05/06/2025).      Juna Caban,  MD

## 2024-05-07 ENCOUNTER — Telehealth: Payer: Self-pay

## 2024-05-07 ENCOUNTER — Ambulatory Visit: Payer: Self-pay | Admitting: Family Medicine

## 2024-05-07 DIAGNOSIS — N1831 Chronic kidney disease, stage 3a: Secondary | ICD-10-CM | POA: Insufficient documentation

## 2024-05-07 LAB — COMPREHENSIVE METABOLIC PANEL WITH GFR
ALT: 15 IU/L (ref 0–32)
AST: 20 IU/L (ref 0–40)
Albumin: 4.8 g/dL (ref 3.9–4.9)
Alkaline Phosphatase: 97 IU/L (ref 44–121)
BUN/Creatinine Ratio: 15 (ref 12–28)
BUN: 18 mg/dL (ref 8–27)
Bilirubin Total: 0.3 mg/dL (ref 0.0–1.2)
CO2: 21 mmol/L (ref 20–29)
Calcium: 10.1 mg/dL (ref 8.7–10.3)
Chloride: 105 mmol/L (ref 96–106)
Creatinine, Ser: 1.19 mg/dL — ABNORMAL HIGH (ref 0.57–1.00)
Globulin, Total: 2.2 g/dL (ref 1.5–4.5)
Glucose: 92 mg/dL (ref 70–99)
Potassium: 3.8 mmol/L (ref 3.5–5.2)
Sodium: 141 mmol/L (ref 134–144)
Total Protein: 7 g/dL (ref 6.0–8.5)
eGFR: 52 mL/min/{1.73_m2} — ABNORMAL LOW (ref >59)

## 2024-05-07 LAB — CBC WITH DIFFERENTIAL/PLATELET
Basophils Absolute: 0 10*3/uL (ref 0.0–0.2)
Basos: 1 %
EOS (ABSOLUTE): 0.1 10*3/uL (ref 0.0–0.4)
Eos: 1 %
Hematocrit: 41.6 % (ref 34.0–46.6)
Hemoglobin: 13.5 g/dL (ref 11.1–15.9)
Immature Grans (Abs): 0.1 10*3/uL (ref 0.0–0.1)
Immature Granulocytes: 1 %
Lymphocytes Absolute: 1.8 10*3/uL (ref 0.7–3.1)
Lymphs: 33 %
MCH: 29.7 pg (ref 26.6–33.0)
MCHC: 32.5 g/dL (ref 31.5–35.7)
MCV: 92 fL (ref 79–97)
Monocytes Absolute: 0.3 10*3/uL (ref 0.1–0.9)
Monocytes: 6 %
Neutrophils Absolute: 3.2 10*3/uL (ref 1.4–7.0)
Neutrophils: 58 %
Platelets: 275 10*3/uL (ref 150–450)
RBC: 4.54 x10E6/uL (ref 3.77–5.28)
RDW: 13.3 % (ref 11.7–15.4)
WBC: 5.5 10*3/uL (ref 3.4–10.8)

## 2024-05-07 NOTE — Addendum Note (Signed)
 Addended by: Watson Hacking on: 05/07/2024 08:28 AM   Modules accepted: Orders

## 2024-05-07 NOTE — Progress Notes (Signed)
 Complex Care Management Note Care Guide Note  05/07/2024 Name: [name] MRN: [id number] DOB: 1961/01/04   Complex Care Management Outreach Attempts: An unsuccessful telephone outreach was attempted today to offer the patient information about available complex care management services.  Follow Up Plan:  Additional outreach attempts will be made to offer the patient complex care management information and services.   Encounter Outcome:  No Answer  Creola Doheny [facility], [facility] Guide  Direct Dial: [phone]  Fax [phone]

## 2024-05-07 NOTE — Progress Notes (Signed)
 Complex Care Management Note  Care Guide Note 05/07/2024 Name: MOLLEIGH HUOT MRN: [id number] DOB: [birth year]  Aloma Arrant is a 63 y.o. year old female who sees Watson Hacking, MD for primary care. I reached out to Aloma Arrant by phone today to offer complex care management services.  Ms. Heyliger was given information about Complex Care Management services today including:   The Complex Care Management services include support from the care team which includes your Nurse Care Manager, Clinical Social Worker, or Pharmacist.  The Complex Care Management team is here to help remove barriers to the health concerns and goals most important to you. Complex Care Management services are voluntary, and the patient may decline or stop services at any time by request to their care team member.   Complex Care Management Consent Status: Patient agreed to services and verbal consent obtained.   Follow up plan:  Telephone appointment with complex care management team member scheduled for:  05-15-24  Encounter Outcome:  Patient Scheduled  Creola Doheny [facility], [facility] Guide  Direct Dial: [phone]  Fax [phone]

## 2024-05-13 ENCOUNTER — Telehealth: Admitting: Licensed Clinical Social Worker

## 2024-05-14 DIAGNOSIS — Z1231 Encounter for screening mammogram for malignant neoplasm of breast: Secondary | ICD-10-CM | POA: Diagnosis not present

## 2024-05-14 LAB — HM MAMMOGRAPHY

## 2024-05-15 ENCOUNTER — Telehealth: Payer: Self-pay

## 2024-05-15 ENCOUNTER — Telehealth: Payer: Self-pay | Admitting: Licensed Clinical Social Worker

## 2024-05-19 ENCOUNTER — Other Ambulatory Visit: Payer: Self-pay

## 2024-05-19 DIAGNOSIS — F32A Depression, unspecified: Secondary | ICD-10-CM

## 2024-05-19 NOTE — Patient Outreach (Signed)
 Complex Care Management   Visit Note  05/19/2024  Name:  [name] MRN: [id number] DOB: 01-08-1961  Situation: Referral received for Complex Care Management related to SDOH Barriers:  Food insecurity I obtained verbal consent from Patient.  Visit completed with patient  on the phone  Background:   Past Medical History:  Diagnosis Date   Anemia    Anxiety    Back pain    Constipation    Decreased libido    on depo testosterone injections from GYN (Dr. Rox at [facility] 04/2015)   Depression    Edema    Fibromyalgia    Hemorrhoids    History of colon polyps    TUBULAR ADENOMA   History of kidney stones    History of pancreatitis    2001   Hydronephrosis, right    Hypertension    IBS (irritable bowel syndrome)    Medullary sponge kidney    Melanoma (HCC)    Migraine    Mild intermittent asthma    NO INHALER   Osteoarthritis    Pleural fibrosis    MILD SUBPLEURAL FIBROSIS-- LAST CT 2011--  PER PULMOLOGIST NOTE DR SOOD 2011   Rheumatoid arteritis (HCC)    SOB (shortness of breath) on exertion     Assessment:  BSW outreached patient for initial outreach to assess SDOH barriers. Social worker was able to identify that food insecurity and financial strain are present. Patient expressed that she would like to be followed up by an RN Case manager. Social worker will connect patient with resources and will connect patient with RN case Production designer, theatre/television/film.  SDOH Interventions Today    Flowsheet Row Most Recent Value  SDOH Interventions   Food Insecurity Interventions Community Resources Provided  Financial Strain Interventions Community Resources Provided      Recommendation:   No recommendations at this time.  Follow Up Plan:   Referral to RN Case Manager  Orlean Fey, BSW [place]  [facility] Social Worker, [facility] [phone]

## 2024-05-20 ENCOUNTER — Telehealth: Payer: Self-pay

## 2024-05-20 NOTE — Progress Notes (Unsigned)
 Complex Care Management Note Care Guide Note  05/20/2024 Name: [name] MRN: [id number] DOB: 07-23-1961   Complex Care Management Outreach Attempts: An unsuccessful telephone outreach was attempted today to offer the patient information about available complex care management services.  Follow Up Plan:  Additional outreach attempts will be made to offer the patient complex care management information and services.   Encounter Outcome:  No Answer  Leotis Rase [facility], [facility] Guide  Direct Dial: [phone]  Fax [phone]

## 2024-05-21 NOTE — Progress Notes (Unsigned)
 Complex Care Management Note Care Guide Note  05/21/2024 Name: [name] MRN: [id number] DOB: 1961-11-03   Complex Care Management Outreach Attempts: A second unsuccessful outreach was attempted today to offer the patient with information about available complex care management services.  Follow Up Plan:  Additional outreach attempts will be made to offer the patient complex care management information and services.   Encounter Outcome:  No Answer  Leotis Rase [facility], [facility] Guide  Direct Dial: [phone]  Fax [phone]

## 2024-05-22 NOTE — Progress Notes (Signed)
 Complex Care Management Note Care Guide Note  05/22/2024 Name: [name] MRN: [id number] DOB: 08-28-1961   Complex Care Management Outreach Attempts: A third unsuccessful outreach was attempted today to offer the patient with information about available complex care management services.  Follow Up Plan:  No further outreach attempts will be made at this time. We have been unable to contact the patient to offer or enroll patient in complex care management services.  Encounter Outcome:  No Answer  Leotis Rase [facility] Guide  Direct Dial: [phone]  Fax [phone]

## 2024-05-28 ENCOUNTER — Telehealth

## 2024-06-12 DIAGNOSIS — R1319 Other dysphagia: Secondary | ICD-10-CM | POA: Diagnosis not present

## 2024-06-19 ENCOUNTER — Other Ambulatory Visit: Payer: Self-pay

## 2024-06-19 DIAGNOSIS — M05771 Rheumatoid arthritis with rheumatoid factor of right ankle and foot without organ or systems involvement: Secondary | ICD-10-CM

## 2024-06-19 DIAGNOSIS — F32A Depression, unspecified: Secondary | ICD-10-CM

## 2024-06-19 NOTE — Patient Outreach (Signed)
 Complex Care Management   Visit Note  06/19/2024  Name:  [name] MRN: [id number] DOB: 12-06-1960  Situation: Referral received for Complex Care Management related to Essential Hypertension, Migraines, Rheumatoid arthritis involving both hands with positive rheumatoid factor, Depression, Obesity.  I obtained verbal consent from Patient.  Visit completed with patient on the phone.   Background:   Past Medical History:  Diagnosis Date   Anemia    Anxiety    Back pain    Constipation    Decreased libido    on depo testosterone injections from GYN (Dr. Rox at [facility] 04/2015)   Depression    Edema    Fibromyalgia    Hemorrhoids    History of colon polyps    TUBULAR ADENOMA   History of kidney stones    History of pancreatitis    2001   Hydronephrosis, right    Hypertension    IBS (irritable bowel syndrome)    Medullary sponge kidney    Melanoma (HCC)    Migraine    Mild intermittent asthma    NO INHALER   Osteoarthritis    Pleural fibrosis    MILD SUBPLEURAL FIBROSIS-- LAST CT 2011--  PER PULMOLOGIST NOTE DR SOOD 2011   Rheumatoid arteritis (HCC)    SOB (shortness of breath) on exertion     Assessment: Patient Reported Symptoms:  Cognitive Cognitive Status: Alert and oriented to person, place, and time Cognitive/Intellectual Conditions Management [RPT]: None reported or documented in medical history or problem list      Neurological Neurological Review of Symptoms: Headaches Neurological Management Strategies: Medication therapy, Routine screening Neurological Self-Management Outcome: 4 (good)  HEENT HEENT Symptoms Reported: Nosebleed HEENT Management Strategies: Routine screening HEENT Self-Management Outcome: 4 (good)    Cardiovascular Cardiovascular Symptoms Reported: No symptoms reported Does patient have uncontrolled Hypertension?: No Cardiovascular Management Strategies: Routine screening, Medication therapy, Diet modification Cardiovascular  Self-Management Outcome: 4 (good)  Respiratory Respiratory Symptoms Reported: Wheezing, Shortness of breath Respiratory Management Strategies: Routine screening, Medication therapy  Endocrine Endocrine Symptoms Reported: No symptoms reported    Gastrointestinal Gastrointestinal Symptoms Reported: Nausea, Constipation Gastrointestinal Management Strategies: Diet modification, Medication therapy (Routine Screening)    Genitourinary Genitourinary Symptoms Reported: No symptoms reported    Integumentary Integumentary Symptoms Reported: No symptoms reported    Musculoskeletal Musculoskelatal Symptoms Reviewed: Joint pain, Unsteady gait Additional Musculoskeletal Details: hands, elbows, knees and leg pain Musculoskeletal Management Strategies: Medication therapy, Routine screening, Activity Musculoskeletal Self-Management Outcome: 4 (good) Falls in the past year?: Yes Number of falls in past year: 1 or less Was there an injury with Fall?: No Fall Risk Category Calculator: 1 Patient Fall Risk Level: Low Fall Risk Patient at Risk for Falls Due to: History of fall(s), Impaired balance/gait  Psychosocial Psychosocial Symptoms Reported: Depression - if selected complete PHQ 2-9 Behavioral Management Strategies: Medication therapy Major Change/Loss/Stressor/Fears (CP): Medical condition, self Techniques to Cope with Loss/Stress/Change: Medication Quality of Family Relationships: supportive, helpful, involved Do you feel physically threatened by others?: No      06/19/2024    2:32 PM  Depression screen PHQ 2/9  Decreased Interest 1  Down, Depressed, Hopeless 0  PHQ - 2 Score 1    There were no vitals filed for this visit.  Medications Reviewed Today     Reviewed by Morgan Clayborne CROME, RN (Registered Nurse) on 06/19/24 at 1441  Med List Status: <None>   Medication Order Taking? Sig Documenting Provider Last Dose Status Informant  albuterol  (VENTOLIN  HFA) 108 (90  Base) MCG/ACT inhaler  [id number] Yes INHALE TWO PUFFS BY MOUTH EVERY 6 HOURS AS NEEDED FOR WHEEZING OR SHORTNESS OF SHERIDA Joyce Norleen JAYSON, MD  Active            Med Note BEVERLEE LUCIENNE FALCON   Tue Jan 30, 2023  2:22 PM) As needed  aspirin 81 MG chewable tablet [id number] Yes Chew 81 mg by mouth daily. [provider]  Active Self           Med Note FREDERIK STEVA JINNY Stevan Aug 15, 2023  4:04 PM)    DULoxetine  (CYMBALTA ) 60 MG capsule [id number] Yes TAKE 1 CAPSULE BY MOUTH DAILY Lalonde, John C, MD  Active   fluticasone  (FLONASE ) 50 MCG/ACT nasal spray [id number]  Place 1 spray into both nostrils daily.  Patient not taking: Reported on 06/19/2024   Gwenn Kent, MD  Active            Med Note BEVERLEE, LUCIENNE FALCON Debar Sep 26, 2022  3:40 PM) prn  hydroxychloroquine (PLAQUENIL) 200 MG tablet [id number] Yes Take 200 mg by mouth 2 (two) times daily. [provider]  Active Self           Med Note FREDERIK STEVA JINNY Stevan Aug 15, 2023  4:04 PM)    losartan -hydrochlorothiazide  (HYZAAR) 100-12.5 MG tablet [id number] Yes TAKE 1 TABLET BY MOUTH DAILY Lalonde, John C, MD  Active   Magnesium Oxide (MAG-CAPS PO) [id number] Yes Take 400 mg by mouth. [provider]  Active   Melatonin 5 MG CHEW [id number] Yes Chew 5 mg by mouth at bedtime.  Patient taking differently: Chew 5 mg by mouth at bedtime. Patient is taking 10 mg of Melatonin nightly   [provider]  Active   metFORMIN  (GLUCOPHAGE ) 500 MG tablet [id number]  1 po BID with meals Opalski, Barnie, DO  Active   methocarbamol (ROBAXIN) 500 MG tablet [id number]  Take 500 mg by mouth 3 (three) times daily as needed for muscle spasms.  Patient not taking: Reported on 09/17/2023   [provider]  Active   ondansetron  (ZOFRAN ) 8 MG tablet [id number] Yes Take 8 mg by mouth as needed for nausea or vomiting. [provider]  Active   pantoprazole (PROTONIX) 40 MG tablet [id number] Yes 1 tablet  Patient taking differently: 40 mg 2  (two) times daily.   [provider]  Active   polyethylene glycol (MIRALAX / GLYCOLAX) 17 g packet [id number] Yes Take 17 g by mouth daily. [provider]  Active   SUMAtriptan  (IMITREX ) 100 MG tablet [id number] Yes Take 1 tablet (100 mg total) by mouth every 2 (two) hours as needed for migraine. May repeat in 2 hours if headache persists or recurs. Lalonde, John C, MD  Active   topiramate  (TOPAMAX ) 100 MG tablet [id number] Yes TAKE 1 TABLET BY MOUTH 2 TIMES A DAY Joyce Norleen JAYSON, MD  Active   traZODone  (DESYREL ) 50 MG tablet [id number] Yes Take 0.5-1 tablets (25-50 mg total) by mouth at bedtime as needed for sleep. Joyce Norleen JAYSON, MD  Active   Upadacitinib ER [facility]) [address] [place] UA75 [id number] Yes Take 1 tablet by mouth daily. [provider]  Active   Vitamin D , Ergocalciferol , (DRISDOL ) 1.25 MG (50000 UNIT) CAPS capsule [id number] Yes Take 1 capsule (50,000 Units total) by mouth every 7 (seven) days. Midge Barnie, DO  Active             Recommendation:  Follow up with Dr. Lavell for management of your RA as directed   Follow Up Plan:   Expect to hear from the PREP (Provider Referral Exercise Program) nurse to help you get signed up for the [facility] exercise program  Telephone follow up appointment date/time:  Monday, August 25 at 12:30 PM  Clayborne Ly RN BSN CCM [place]  [facility], [facility] Nurse Care Coordinator  Direct Dial: [phone] Website: [email or web address]

## 2024-06-20 ENCOUNTER — Other Ambulatory Visit: Payer: Self-pay | Admitting: Family Medicine

## 2024-06-23 NOTE — Patient Instructions (Signed)
 Visit Information  Thank you for taking time to visit with me. Please don't hesitate to contact me if I can be of assistance to you before our next scheduled appointment.  Our next appointment is by telephone on Monday, August 25 at 12:30 PM Please call the care guide team at [phone] if you need to cancel or reschedule your appointment.   Following is a copy of your care plan:   Goals Addressed             This Visit's Progress    VBCI RN Care Plan related to Depression       Problems:  Chronic Disease Management support and education needs related to Depression  Goal: Over the next 90 days the Patient will continue to work with RN Care Manager and/or Social Worker to address care management and care coordination needs related to Depression as evidenced by adherence to care management team scheduled appointments      Interventions:   Evaluation of current treatment plan related to Depression, Limited social support, Limited access to food, Inability to perform ADL's independently, and Inability to perform IADL's independently self-management and patient's adherence to plan as established by provider. Provided education to patient re: the benefits of establishing a routine exercise program, educated re: recommendations for 150 minutes weekly, to help with pain, physical limitations and help change her focus when feeling down due to having chronic pain  Educated patient re: availability to speak with a LCSW for counseling and outpatient resources, patient declines at this time  Reviewed medications with patient and discussed indication, frequency and dosing, completed medication reconciliation with no discrepancies noted  Care Guide referral for food insecurities Discussed plans with patient for ongoing care management follow up and provided patient with direct contact information for care management team  Patient Self-Care Activities:  Attend all scheduled provider  appointments Attend church or other social activities Call pharmacy for medication refills 3-7 days in advance of running out of medications Call provider office for new concerns or questions  Take medications as prescribed   develop an exercise routine  Plan:  Work with the PREP (provider referral exercise program) nurse to get signed up for the exercise program at your local [employer] Follow up with provider re: treatment of Rheumatoid Arthritis as directed  Telephone follow up appointment with care management team member scheduled for:  Monday, August 25 at 12:30 PM SDOH referral sent to [id number] for food insecurities          VBCI RN Care Plan related to Rheumatoid arthritis involving both hands with positive rheumatoid factor       Problems:  Chronic Disease Management support and education needs related to Rheumatoid arthritis involving both hands with positive rheumatoid factor  Goal: Over the next 90 days the Patient will continue to work with Medical illustrator and/or Social Worker to address care management and care coordination needs related to Rheumatoid arthritis involving both hands with positive rheumatoid factor as evidenced by adherence to care management team scheduled appointments      Interventions:   Pain Interventions: Pain assessment performed Medications reviewed Reviewed provider established plan for pain management Discussed importance of adherence to all scheduled medical appointments Counseled on the importance of reporting any/all new or changed pain symptoms or management strategies to pain management provider Advised patient to report to care team affect of pain on daily activities Discussed use of relaxation techniques and/or diversional activities to assist with pain reduction (distraction, imagery, relaxation, massage,  acupressure, TENS, heat, and cold application Reviewed with patient prescribed pharmacological and nonpharmacological pain relief  strategies Screening for signs and symptoms of depression related to chronic disease state  Counseled patient on recommendations related to exercise, aim for 150 minutes weekly  Educated patient about the PREP program and patient would like to participate  Sent in basket message to Dr. Joyce requesting a PREP referral and this was approved Discussed plans with patient for ongoing nurse care management follow up and provided patient with direct contact information for nurse case management   Patient Self-Care Activities:  Attend all scheduled provider appointments Call pharmacy for medication refills 3-7 days in advance of running out of medications Call provider office for new concerns or questions  Take medications as prescribed   Work with the nurse care manager to address care coordination needs and will continue to work with the clinical team to address health care and disease management related needs  Plan:  Work with the PREP (provider referral exercise program) nurse to get signed up for the exercise program at your local [employer] Follow up with provider re: treatment of Rheumatoid Arthritis as directed  Telephone follow up appointment with care management team member scheduled for:  Monday, August 25 at 12:30 PM SDOH referral sent to [id number] for food insecurities             Please call 1-800-273-TALK (toll free, 24 hour hotline) if you are experiencing a Mental Health or Behavioral Health Crisis or need someone to talk to.  Patient verbalizes understanding of instructions and care plan provided today and agrees to view in MyChart. Active MyChart status and patient understanding of how to access instructions and care plan via MyChart confirmed with patient.     Clayborne Ly RN BSN CCM [place]  [facility], [facility] Health Nurse Care Coordinator  Direct Dial: [phone] Website: [email or web address]

## 2024-06-25 ENCOUNTER — Telehealth: Payer: Self-pay | Admitting: *Deleted

## 2024-06-25 NOTE — Progress Notes (Signed)
 Complex Care Management Note Care Guide Note  06/25/2024 Name: MALORY SPURR MRN: [id number] DOB: [birth year]   Complex Care Management Outreach Attempts: An unsuccessful telephone outreach was attempted today to offer the patient information about available complex care management services.  Follow Up Plan:  Additional outreach attempts will be made to offer the patient complex care management information and services.   Encounter Outcome:  No Answer  [facility] Care Guide  Direct [phone] Fax:[phone] Website: [email or web address]

## 2024-06-26 ENCOUNTER — Telehealth: Payer: Self-pay | Admitting: *Deleted

## 2024-06-26 NOTE — Progress Notes (Signed)
 Complex Care Management Note Care Guide Note  06/26/2024 Name: [name] MRN: [id number] DOB: [birth year]   Complex Care Management Outreach Attempts: An unsuccessful telephone outreach was attempted today to offer the patient information about available complex care management services.  Follow Up Plan:  Additional outreach attempts will be made to offer the patient complex care management information and services.   Encounter Outcome:  No Answer  [facility] Care Guide  Direct [phone] [phone] Website: [email or web address]

## 2024-06-27 ENCOUNTER — Telehealth: Payer: Self-pay | Admitting: *Deleted

## 2024-06-27 NOTE — Progress Notes (Signed)
 Complex Care Management Note Care Guide Note  06/27/2024 Name: [name] MRN: [id number] DOB: 08-16-1961   Complex Care Management Outreach Attempts: A third unsuccessful outreach was attempted today to offer the patient with information about available complex care management services.  Follow Up Plan:  No further outreach attempts will be made at this time. We have been unable to contact the patient to offer or enroll patient in complex care management services.  Encounter Outcome:  No Answer  [facility] Care Guide  Direct Dial:[phone] Fax:[phone] Website: [email or web address]

## 2024-07-01 ENCOUNTER — Other Ambulatory Visit: Payer: Self-pay | Admitting: *Deleted

## 2024-07-01 DIAGNOSIS — I10 Essential (primary) hypertension: Secondary | ICD-10-CM

## 2024-07-09 ENCOUNTER — Telehealth: Payer: Self-pay

## 2024-07-09 NOTE — Telephone Encounter (Signed)
 Left message about PREP Program and asked her to return my call.

## 2024-07-10 ENCOUNTER — Telehealth: Payer: Self-pay

## 2024-07-10 NOTE — Telephone Encounter (Signed)
 [name] is interested in starting the next PREP class at [place] starting August 19th. She will come for her initial assessment Monday August 18th.

## 2024-07-14 DIAGNOSIS — R5382 Chronic fatigue, unspecified: Secondary | ICD-10-CM | POA: Diagnosis not present

## 2024-07-14 DIAGNOSIS — Z79899 Other long term (current) drug therapy: Secondary | ICD-10-CM | POA: Diagnosis not present

## 2024-07-14 DIAGNOSIS — G894 Chronic pain syndrome: Secondary | ICD-10-CM | POA: Diagnosis not present

## 2024-07-14 DIAGNOSIS — M0579 Rheumatoid arthritis with rheumatoid factor of multiple sites without organ or systems involvement: Secondary | ICD-10-CM | POA: Diagnosis not present

## 2024-07-14 DIAGNOSIS — M1991 Primary osteoarthritis, unspecified site: Secondary | ICD-10-CM | POA: Diagnosis not present

## 2024-07-14 DIAGNOSIS — M542 Cervicalgia: Secondary | ICD-10-CM | POA: Diagnosis not present

## 2024-07-14 NOTE — Progress Notes (Signed)
 [facility] Evaluation  Patient Details  Name: [name] MRN: [id number] Date of Birth: Jan 13, 1961 Age: 63 y.o. PCP: Andrea Norleen JAYSON, MD  Vitals:   07/14/24 1545  BP: (!) 127/92  Pulse: 69  SpO2: 99%  Weight: 200 lb 9.6 oz (91 kg)     [facility] Eval - 07/14/24 1500       [facility] PREP Location   [facility] PRE[facility] Location [facility]      Referral    Referring Provider [name]    Reason for referral Hypertension;Inactivity;Obesitity/Overweight    Program Start Date 07/15/24      Measurement   Waist Circumference 44.25 inches    Hip Circumference 48 inches    Body fat 42.3 percent      Information for Trainer   Goals --   To feel better and lose weight   Current Exercise Some walking    Orthopedic Concerns Arthritis    Pertinent Medical History --   Hypertension, arthritis   Current Barriers none      Mobility and Daily Activities   I find it easy to walk up or down two or more flights of stairs. 2    I have no trouble taking out the trash. 4    I do housework such as vacuuming and dusting on my own without difficulty. 2    I can easily lift a gallon of milk (8lbs). 4    I can easily walk a mile. 1    I have no trouble reaching into high cupboards or reaching down to pick up something from the floor. 2    I do not have trouble doing out-door work such as Loss adjuster, chartered, raking leaves, or gardening. 1      Mobility and Daily Activities   I feel younger than my age. 1    I feel independent. 1    I feel energetic. 1    I live an active life.  2    I feel strong. 1    I feel healthy. 1    I feel active as other people my age. 1      How fit and strong are you.   Fit and Strong Total Score 24         Past Medical History:  Diagnosis Date   Anemia    Anxiety    Back pain    Constipation    Decreased libido    on depo testosterone injections from GYN ([name] at [facility] 04/2015)   Depression    Edema    Fibromyalgia    Hemorrhoids    History of colon  polyps    TUBULAR ADENOMA   History of kidney stones    History of pancreatitis    2001   Hydronephrosis, right    Hypertension    IBS (irritable bowel syndrome)    Medullary sponge kidney    Melanoma (HCC)    Migraine    Mild intermittent asthma    NO INHALER   Osteoarthritis    Pleural fibrosis    MILD SUBPLEURAL FIBROSIS-- LAST CT 2011--  PER PULMOLOGIST NOTE DR SOOD 2011   Rheumatoid arteritis (HCC)    SOB (shortness of breath) on exertion    Past Surgical History:  Procedure Laterality Date   ABDOMINAL HYSTERECTOMY  11/27/1994   CESAREAN SECTION  X2   COLONOSCOPY W/ POLYPECTOMY  2004  &  01-16-2011   CYSTOSCOPY WITH RETROGRADE PYELOGRAM, URETEROSCOPY AND STENT PLACEMENT Right  06/18/2014   Procedure: CYSTOSCOPY WITH RETROGRADE PYELOGRAM,  STENT PLACEMENT;  Surgeon: Andrea VEAR Oiler, MD;  Location: [facility];  Service: Urology;  Laterality: Right;   CYSTOSCOPY/RETROGRADE/URETEROSCOPY Right 07/13/2014   Procedure: CYSTOSCOPY/RETROGRADE/RIGHT URETEROSCOPY AND REMOVAL OF RIGHT STENT;  Surgeon: Andrea Andrea Shack, MD;  Location: [facility];  Service: Urology;  Laterality: Right;   CYSTOSCOPY/RETROGRADE/URETEROSCOPY/STONE EXTRACTION WITH BASKET Right 06/08/2014   Procedure: CYSTOSCOPY RIGHT RETROGRADE/URETEROSCOPY/STONE EXTRACTION WITH BASKET WITH STENT PLACEMENT;  Surgeon: Andrea Andrea Shack, MD;  Location: [facility];  Service: Urology;  Laterality: Right;   ERCP  06/27/2000   EXTRACORPOREAL SHOCK WAVE LITHOTRIPSY Right 12/24/2012   EXTRACORPOREAL SHOCK WAVE LITHOTRIPSY Right 01/30/2022   Procedure: EXTRACORPOREAL SHOCK WAVE LITHOTRIPSY (ESWL);  Surgeon: Andrea Andrea Righter, MD;  Location: [facility];  Service: Urology;  Laterality: Right;   HOLMIUM LASER APPLICATION Right 06/08/2014   Procedure:  HOLMIUM LASER APPLICATION;  Surgeon: Andrea Andrea Shack, MD;  Location: [facility];  Service: Urology;  Laterality: Right;    HOLMIUM LASER APPLICATION Right 07/13/2014   Procedure: HOLMIUM LASER APPLICATION;  Surgeon: Andrea Andrea Shack, MD;  Location: [facility];  Service: Urology;  Laterality: Right;   LAPAROSCOPIC CHOLECYSTECTOMY  [phone]   melanoma removal     01/2023, right side of face   PARTIAL NEPHRECTOMY Bilateral LEFT  05-18-2006/   RIGHT 1998   LEFT (ANGIOMYOLIPOMA)   RIGHT (BENIGN TUMOR)   POLYPECTOMY  06/29/2023   tubular adenoma colonic polyp   TOTAL ABDOMINAL [place]ST[place]  11[place]27[place]   TUBAL LIGATION  11/28/1991   Social History   Tobacco Use  Smoking Status Never  Smokeless Tobacco Never  [name] is ready to start the PREP program on August 19th at 2:30.  [name] 07/14/2024, 3:48 PM

## 2024-07-16 ENCOUNTER — Telehealth: Payer: Self-pay

## 2024-07-16 NOTE — Telephone Encounter (Signed)
 [name] has some other commitments at the time of PREP class so would like to join the next [place] class which will probably be in October. I will call her when I have a date.

## 2024-07-17 ENCOUNTER — Other Ambulatory Visit: Payer: Self-pay | Admitting: Family Medicine

## 2024-07-21 ENCOUNTER — Telehealth

## 2024-07-23 ENCOUNTER — Telehealth: Payer: Self-pay

## 2024-07-23 NOTE — Telephone Encounter (Signed)
 Left a message about the upcoming PREP class at the [facility] starting October 21, 10:00 T/Ths. Asked her to return my call.

## 2024-08-07 DIAGNOSIS — Z85820 Personal history of malignant melanoma of skin: Secondary | ICD-10-CM | POA: Diagnosis not present

## 2024-08-07 DIAGNOSIS — L905 Scar conditions and fibrosis of skin: Secondary | ICD-10-CM | POA: Diagnosis not present

## 2024-08-07 DIAGNOSIS — D225 Melanocytic nevi of trunk: Secondary | ICD-10-CM | POA: Diagnosis not present

## 2024-08-07 DIAGNOSIS — D485 Neoplasm of uncertain behavior of skin: Secondary | ICD-10-CM | POA: Diagnosis not present

## 2024-08-07 DIAGNOSIS — Z1283 Encounter for screening for malignant neoplasm of skin: Secondary | ICD-10-CM | POA: Diagnosis not present

## 2024-08-07 DIAGNOSIS — Z08 Encounter for follow-up examination after completed treatment for malignant neoplasm: Secondary | ICD-10-CM | POA: Diagnosis not present

## 2024-08-18 ENCOUNTER — Other Ambulatory Visit: Payer: Self-pay | Admitting: Family Medicine

## 2024-08-18 ENCOUNTER — Telehealth: Payer: Self-pay

## 2024-08-20 DIAGNOSIS — L820 Inflamed seborrheic keratosis: Secondary | ICD-10-CM | POA: Diagnosis not present

## 2024-08-20 DIAGNOSIS — D485 Neoplasm of uncertain behavior of skin: Secondary | ICD-10-CM | POA: Diagnosis not present

## 2024-08-20 DIAGNOSIS — L821 Other seborrheic keratosis: Secondary | ICD-10-CM | POA: Diagnosis not present

## 2024-08-20 DIAGNOSIS — L98499 Non-pressure chronic ulcer of skin of other sites with unspecified severity: Secondary | ICD-10-CM | POA: Diagnosis not present

## 2024-09-01 ENCOUNTER — Telehealth: Payer: Self-pay

## 2024-09-01 NOTE — Telephone Encounter (Signed)
 Left message about scheduling initial assessment for the upcoming class at [place] starting on 10/21.

## 2024-09-02 ENCOUNTER — Telehealth: Payer: Self-pay

## 2024-09-02 NOTE — Patient Instructions (Signed)
 Garen JAYSON Molt - I am sorry I was unable to reach you today for our scheduled appointment. I work with Joyce Norleen JAYSON, MD and am calling to support your healthcare needs. Please contact me at [phone] at your earliest convenience. I look forward to speaking with you soon.   Thank you,   Clayborne Ly RN BSN CCM [place]  [facility], [facility] Health Nurse Care Coordinator  Direct Dial: [phone] Website: [email or web address]

## 2024-09-04 ENCOUNTER — Telehealth: Payer: Self-pay

## 2024-09-04 NOTE — Telephone Encounter (Signed)
 Left message about upcoming PREP Class starting at the [facility] on October 21 and about scheduling initial assessment. Asked her to return my call.

## 2024-09-18 ENCOUNTER — Telehealth: Payer: Self-pay | Admitting: *Deleted

## 2024-09-18 NOTE — Progress Notes (Signed)
 Complex Care Management Care Guide Note  09/18/2024 Name: [name] MRN: [id number] DOB: 21-Dec-1960  [name] is a 63 y.o. year old female who is a primary care patient of Lalonde, John C, MD and is actively engaged with the care management team. I reached out to [name] by phone today to assist with canceling   with the RN Case Manager.  Follow up plan: None patient declines services. No further outreach attempts will be made at this time.  Harlene Satterfield  [facility]  [facility], [facility] Guide  Direct Dial: [phone]  Fax [phone]

## 2024-09-19 ENCOUNTER — Telehealth

## 2024-09-23 ENCOUNTER — Other Ambulatory Visit: Payer: Self-pay | Admitting: Medical Genetics

## 2024-10-19 ENCOUNTER — Encounter: Payer: Self-pay | Admitting: Family Medicine

## 2024-10-19 DIAGNOSIS — J209 Acute bronchitis, unspecified: Secondary | ICD-10-CM

## 2024-10-19 DIAGNOSIS — J45909 Unspecified asthma, uncomplicated: Secondary | ICD-10-CM

## 2024-10-20 ENCOUNTER — Other Ambulatory Visit: Payer: Self-pay | Admitting: Family Medicine

## 2024-10-20 DIAGNOSIS — G479 Sleep disorder, unspecified: Secondary | ICD-10-CM

## 2024-10-20 MED ORDER — TOPIRAMATE 100 MG PO TABS: 100.0000 mg | ORAL_TABLET | Freq: Two times a day (BID) | ORAL | 1 refills | Status: AC

## 2024-10-20 MED ORDER — ALBUTEROL SULFATE HFA 108 (90 BASE) MCG/ACT IN AERS: 2.0000 | INHALATION_SPRAY | Freq: Four times a day (QID) | RESPIRATORY_TRACT | 0 refills | Status: AC | PRN

## 2024-10-20 NOTE — Telephone Encounter (Signed)
 Is this okay to refill?

## 2024-10-30 ENCOUNTER — Other Ambulatory Visit

## 2024-12-30 ENCOUNTER — Ambulatory Visit: Payer: Self-pay

## 2024-12-30 NOTE — Telephone Encounter (Signed)
 FYI Only or Action Required?: FYI only for provider: appointment scheduled on 12/31/24.  Patient was last seen in primary care on 05/06/2024 by Joyce Norleen BROCKS, MD.  Called Nurse Triage reporting Abdominal Pain.  Symptoms began several days ago.  Interventions attempted: Nothing.  Symptoms are: gradually improving.  Triage Disposition: See Today or Tomorrow in Office (overriding See HCP Within 4 Hours (Or PCP Triage))  Patient/caregiver understands and will follow disposition?:  Yes                Message from Hadassah PARAS sent at 12/30/2024  9:37 AM EST  Reason for Triage: Trouble w appendix, severe pain on right below belly button and difference in bowel movement. Had a fever for a couple of days, none this morning   Reason for Disposition  [1] MILD-MODERATE pain AND [2] constant AND [3] present > 2 hours  Answer Assessment - Initial Assessment Questions Patient unable to be seen anywhere (PCP/UC/ED) due to weather. Requesting appt tomorrow with PCP. Scheduled and advised strict ED precautions due to symptoms.  1. LOCATION: Where does it hurt?      [place].  2. RADIATION: Does the pain shoot anywhere else? (e.g., chest, back)     No  3. ONSET: When did the pain begin? (e.g., minutes, hours or days ago)      Saturday.  4. SUDDEN: Gradual or sudden onset?     Sudden  5. PATTERN Does the pain come and go, or is it constant?     Constant.  6. SEVERITY: How bad is the pain?  (e.g., Scale 1-10; mild, moderate, or severe)     2/10. States pain and symptoms are improving , today is the best day so far. States pain was severe enough she was not able to stand up straight the past few days.  7. RECURRENT SYMPTOM: Have you ever had this type of stomach pain before? If Yes, ask: When was the last time? and What happened that time?      No.  8. CAUSE: What do you think is causing the stomach pain? (e.g., gallstones, recent abdominal surgery)      Unsure.  9. RELIEVING/AGGRAVATING FACTORS: What makes it better or worse? (e.g., antacids, bending or twisting motion, bowel movement)     Lying down flat helps.  10. OTHER SYMPTOMS: Do you have any other symptoms? (e.g., back pain, diarrhea, fever, urination pain, vomiting)       Nausea, liquid green diarrhea, vomiting Saturday and Sunday. No vomiting blood or black material, blood in stool, black tarry stool  Protocols used: Abdominal Pain - Female-A-AH

## 2024-12-31 ENCOUNTER — Ambulatory Visit: Admitting: Family Medicine
